# Patient Record
Sex: Male | Born: 1975 | Race: White | Hispanic: No | Marital: Single | Smoking: Former smoker
Health system: Southern US, Community
[De-identification: ages and names within clinical notes are randomized; demographics above are authoritative.]

## PROBLEM LIST (undated history)

## (undated) DIAGNOSIS — I10 Essential (primary) hypertension: Secondary | ICD-10-CM

## (undated) DIAGNOSIS — K219 Gastro-esophageal reflux disease without esophagitis: Secondary | ICD-10-CM

## (undated) DIAGNOSIS — K429 Umbilical hernia without obstruction or gangrene: Secondary | ICD-10-CM

## (undated) DIAGNOSIS — F1010 Alcohol abuse, uncomplicated: Secondary | ICD-10-CM

## (undated) DIAGNOSIS — E785 Hyperlipidemia, unspecified: Secondary | ICD-10-CM

## (undated) DIAGNOSIS — M1990 Unspecified osteoarthritis, unspecified site: Secondary | ICD-10-CM

## (undated) DIAGNOSIS — E039 Hypothyroidism, unspecified: Secondary | ICD-10-CM

## (undated) DIAGNOSIS — F32A Depression, unspecified: Secondary | ICD-10-CM

## (undated) HISTORY — PX: SPLENECTOMY, TOTAL: SHX788

## (undated) HISTORY — PX: HERNIA REPAIR: SHX51

## (undated) HISTORY — DX: Depression, unspecified: F32.A

## (undated) HISTORY — DX: Hyperlipidemia, unspecified: E78.5

---

## 2009-06-17 ENCOUNTER — Emergency Department (HOSPITAL_COMMUNITY): Admission: EM | Admit: 2009-06-17 | Discharge: 2009-06-18 | Payer: Self-pay | Admitting: Emergency Medicine

## 2009-06-21 ENCOUNTER — Emergency Department: Payer: Self-pay | Admitting: Emergency Medicine

## 2009-08-02 ENCOUNTER — Emergency Department (HOSPITAL_COMMUNITY): Admission: EM | Admit: 2009-08-02 | Discharge: 2009-08-02 | Payer: Self-pay | Admitting: Emergency Medicine

## 2009-08-03 ENCOUNTER — Emergency Department (HOSPITAL_COMMUNITY): Admission: EM | Admit: 2009-08-03 | Discharge: 2009-08-04 | Payer: Self-pay | Admitting: Emergency Medicine

## 2009-08-04 ENCOUNTER — Ambulatory Visit: Payer: Self-pay | Admitting: Psychiatry

## 2009-08-04 ENCOUNTER — Inpatient Hospital Stay (HOSPITAL_COMMUNITY): Admission: AD | Admit: 2009-08-04 | Discharge: 2009-08-12 | Payer: Self-pay | Admitting: Psychiatry

## 2010-05-18 LAB — DIFFERENTIAL
Basophils Absolute: 0.1 10*3/uL (ref 0.0–0.1)
Basophils Relative: 1 % (ref 0–1)
Eosinophils Absolute: 0 10*3/uL (ref 0.0–0.7)
Eosinophils Relative: 0 % (ref 0–5)
Lymphocytes Relative: 20 % (ref 12–46)
Lymphs Abs: 2.4 10*3/uL (ref 0.7–4.0)
Monocytes Absolute: 1 10*3/uL (ref 0.1–1.0)
Monocytes Relative: 9 % (ref 3–12)
Neutro Abs: 8.5 10*3/uL — ABNORMAL HIGH (ref 1.7–7.7)
Neutrophils Relative %: 70 % (ref 43–77)

## 2010-05-18 LAB — CBC
HCT: 49.8 % (ref 39.0–52.0)
Hemoglobin: 16.7 g/dL (ref 13.0–17.0)
MCHC: 33.6 g/dL (ref 30.0–36.0)
MCV: 96.6 fL (ref 78.0–100.0)
Platelets: 273 10*3/uL (ref 150–400)
RBC: 5.15 MIL/uL (ref 4.22–5.81)
RDW: 15.6 % — ABNORMAL HIGH (ref 11.5–15.5)
WBC: 12.1 10*3/uL — ABNORMAL HIGH (ref 4.0–10.5)

## 2010-05-18 LAB — COMPREHENSIVE METABOLIC PANEL
ALT: 27 U/L (ref 0–53)
AST: 32 U/L (ref 0–37)
Albumin: 4.5 g/dL (ref 3.5–5.2)
Alkaline Phosphatase: 77 U/L (ref 39–117)
BUN: 9 mg/dL (ref 6–23)
CO2: 24 mEq/L (ref 19–32)
Calcium: 8.5 mg/dL (ref 8.4–10.5)
Chloride: 105 mEq/L (ref 96–112)
Creatinine, Ser: 0.79 mg/dL (ref 0.4–1.5)
GFR calc Af Amer: >60 mL/min (ref >60)
GFR calc non Af Amer: >60 mL/min (ref >60)
Glucose, Bld: 182 mg/dL — ABNORMAL HIGH (ref 70–99)
Potassium: 3.2 mEq/L — ABNORMAL LOW (ref 3.5–5.1)
Sodium: 139 mEq/L (ref 135–145)
Total Bilirubin: 0.8 mg/dL (ref 0.3–1.2)
Total Protein: 7.3 g/dL (ref 6.0–8.3)

## 2010-05-18 LAB — URINALYSIS, ROUTINE W REFLEX MICROSCOPIC
Bilirubin Urine: NEGATIVE
Glucose, UA: NEGATIVE mg/dL
Leukocytes, UA: NEGATIVE
Nitrite: NEGATIVE
Protein, ur: 30 mg/dL — AB
Specific Gravity, Urine: 1.028 (ref 1.005–1.030)
Urobilinogen, UA: 0.2 mg/dL (ref 0.0–1.0)
pH: 5.5 (ref 5.0–8.0)

## 2010-05-18 LAB — RAPID URINE DRUG SCREEN, HOSP PERFORMED
Amphetamines: NOT DETECTED
Barbiturates: NOT DETECTED
Benzodiazepines: NOT DETECTED
Cocaine: NOT DETECTED
Opiates: NOT DETECTED
Tetrahydrocannabinol: NOT DETECTED

## 2010-05-18 LAB — TSH: TSH: 9.981 u[IU]/mL — ABNORMAL HIGH (ref 0.350–4.500)

## 2010-05-18 LAB — URINE MICROSCOPIC-ADD ON

## 2010-05-18 LAB — GLUCOSE, CAPILLARY: Glucose-Capillary: 150 mg/dL — ABNORMAL HIGH (ref 70–99)

## 2010-05-18 LAB — ETHANOL: Alcohol, Ethyl (B): 133 mg/dL — ABNORMAL HIGH (ref 0–10)

## 2010-05-18 LAB — T4, FREE: Free T4: 1.12 ng/dL (ref 0.80–1.80)

## 2010-05-18 LAB — T3, FREE: T3, Free: 3.3 pg/mL (ref 2.3–4.2)

## 2010-05-19 LAB — BASIC METABOLIC PANEL
BUN: 10 mg/dL (ref 6–23)
CO2: 23 mEq/L (ref 19–32)
Calcium: 8.9 mg/dL (ref 8.4–10.5)
Chloride: 107 mEq/L (ref 96–112)
Creatinine, Ser: 0.88 mg/dL (ref 0.4–1.5)
GFR calc Af Amer: >60 mL/min (ref >60)
GFR calc non Af Amer: >60 mL/min (ref >60)
Glucose, Bld: 91 mg/dL (ref 70–99)
Potassium: 3.5 mEq/L (ref 3.5–5.1)
Sodium: 141 mEq/L (ref 135–145)

## 2010-05-19 LAB — DIFFERENTIAL
Basophils Absolute: 0.1 10*3/uL (ref 0.0–0.1)
Basophils Relative: 0 % (ref 0–1)
Eosinophils Absolute: 0.1 10*3/uL (ref 0.0–0.7)
Eosinophils Relative: 1 % (ref 0–5)
Lymphocytes Relative: 26 % (ref 12–46)
Lymphs Abs: 3.1 10*3/uL (ref 0.7–4.0)
Monocytes Absolute: 1 10*3/uL (ref 0.1–1.0)
Monocytes Relative: 8 % (ref 3–12)
Neutro Abs: 7.9 10*3/uL — ABNORMAL HIGH (ref 1.7–7.7)
Neutrophils Relative %: 65 % (ref 43–77)

## 2010-05-19 LAB — CBC
HCT: 53.5 % — ABNORMAL HIGH (ref 39.0–52.0)
Hemoglobin: 17.5 g/dL — ABNORMAL HIGH (ref 13.0–17.0)
MCHC: 32.8 g/dL (ref 30.0–36.0)
MCV: 97 fL (ref 78.0–100.0)
Platelets: 325 10*3/uL (ref 150–400)
RBC: 5.51 MIL/uL (ref 4.22–5.81)
RDW: 16.1 % — ABNORMAL HIGH (ref 11.5–15.5)
WBC: 12.1 10*3/uL — ABNORMAL HIGH (ref 4.0–10.5)

## 2010-05-19 LAB — RAPID URINE DRUG SCREEN, HOSP PERFORMED
Amphetamines: NOT DETECTED
Barbiturates: NOT DETECTED
Benzodiazepines: NOT DETECTED
Cocaine: NOT DETECTED
Opiates: NOT DETECTED
Tetrahydrocannabinol: NOT DETECTED

## 2010-05-19 LAB — TRICYCLICS SCREEN, URINE: TCA Scrn: NOT DETECTED

## 2010-05-19 LAB — ETHANOL
Alcohol, Ethyl (B): 171 mg/dL — ABNORMAL HIGH (ref 0–10)
Alcohol, Ethyl (B): 343 mg/dL — ABNORMAL HIGH (ref 0–10)

## 2010-08-18 ENCOUNTER — Emergency Department (HOSPITAL_COMMUNITY)
Admission: EM | Admit: 2010-08-18 | Discharge: 2010-08-19 | Disposition: A | Discharge status: Other Acute Inpatient Hospital | Payer: Self-pay | Attending: Emergency Medicine | Admitting: Emergency Medicine

## 2010-08-18 DIAGNOSIS — F4320 Adjustment disorder, unspecified: Secondary | ICD-10-CM

## 2010-08-18 DIAGNOSIS — F1010 Alcohol abuse, uncomplicated: Secondary | ICD-10-CM | POA: Insufficient documentation

## 2010-08-18 DIAGNOSIS — E039 Hypothyroidism, unspecified: Secondary | ICD-10-CM | POA: Insufficient documentation

## 2010-08-18 LAB — DIFFERENTIAL
Basophils Absolute: 0.1 10*3/uL (ref 0.0–0.1)
Basophils Relative: 0 % (ref 0–1)
Eosinophils Absolute: 0 10*3/uL (ref 0.0–0.7)
Eosinophils Relative: 0 % (ref 0–5)
Lymphocytes Relative: 27 % (ref 12–46)
Lymphs Abs: 3.7 10*3/uL (ref 0.7–4.0)
Monocytes Absolute: 0.8 10*3/uL (ref 0.1–1.0)
Monocytes Relative: 6 % (ref 3–12)
Neutro Abs: 9.4 10*3/uL — ABNORMAL HIGH (ref 1.7–7.7)
Neutrophils Relative %: 67 % (ref 43–77)

## 2010-08-18 LAB — COMPREHENSIVE METABOLIC PANEL
ALT: 30 U/L (ref 0–53)
AST: 25 U/L (ref 0–37)
Albumin: 4.4 g/dL (ref 3.5–5.2)
Alkaline Phosphatase: 86 U/L (ref 39–117)
BUN: 12 mg/dL (ref 6–23)
CO2: 21 mEq/L (ref 19–32)
Calcium: 8.9 mg/dL (ref 8.4–10.5)
Chloride: 103 mEq/L (ref 96–112)
Creatinine, Ser: 0.91 mg/dL (ref 0.50–1.35)
GFR calc Af Amer: >60 mL/min (ref >60)
GFR calc non Af Amer: >60 mL/min (ref >60)
Glucose, Bld: 122 mg/dL — ABNORMAL HIGH (ref 70–99)
Potassium: 3.5 mEq/L (ref 3.5–5.1)
Sodium: 139 mEq/L (ref 135–145)
Total Bilirubin: 0.2 mg/dL — ABNORMAL LOW (ref 0.3–1.2)
Total Protein: 7.6 g/dL (ref 6.0–8.3)

## 2010-08-18 LAB — RAPID URINE DRUG SCREEN, HOSP PERFORMED
Amphetamines: NOT DETECTED
Barbiturates: NOT DETECTED
Benzodiazepines: NOT DETECTED
Cocaine: NOT DETECTED
Opiates: NOT DETECTED
Tetrahydrocannabinol: NOT DETECTED

## 2010-08-18 LAB — CBC
HCT: 49.8 % (ref 39.0–52.0)
Hemoglobin: 17.3 g/dL — ABNORMAL HIGH (ref 13.0–17.0)
MCH: 31.3 pg (ref 26.0–34.0)
MCHC: 34.7 g/dL (ref 30.0–36.0)
MCV: 90.2 fL (ref 78.0–100.0)
Platelets: 373 10*3/uL (ref 150–400)
RBC: 5.52 MIL/uL (ref 4.22–5.81)
RDW: 15.7 % — ABNORMAL HIGH (ref 11.5–15.5)
WBC: 13.9 10*3/uL — ABNORMAL HIGH (ref 4.0–10.5)

## 2010-08-18 LAB — ETHANOL: Alcohol, Ethyl (B): 363 mg/dL — ABNORMAL HIGH (ref 0–11)

## 2010-08-19 ENCOUNTER — Inpatient Hospital Stay (HOSPITAL_COMMUNITY)
Admission: AD | Admit: 2010-08-19 | Discharge: 2010-08-22 | DRG: 897 | Disposition: A | Discharge status: Home / Self Care | Payer: PRIVATE HEALTH INSURANCE | Source: Ambulatory Visit | Attending: Psychiatry | Admitting: Psychiatry

## 2010-08-19 DIAGNOSIS — F39 Unspecified mood [affective] disorder: Secondary | ICD-10-CM

## 2010-08-19 DIAGNOSIS — E039 Hypothyroidism, unspecified: Secondary | ICD-10-CM

## 2010-08-19 DIAGNOSIS — F1020 Alcohol dependence, uncomplicated: Principal | ICD-10-CM

## 2010-08-20 DIAGNOSIS — F39 Unspecified mood [affective] disorder: Secondary | ICD-10-CM

## 2010-08-20 DIAGNOSIS — F1020 Alcohol dependence, uncomplicated: Secondary | ICD-10-CM

## 2010-08-20 LAB — GLUCOSE, CAPILLARY: Glucose-Capillary: 102 mg/dL — ABNORMAL HIGH (ref 70–99)

## 2010-08-21 LAB — GLUCOSE, CAPILLARY: Glucose-Capillary: 121 mg/dL — ABNORMAL HIGH (ref 70–99)

## 2010-08-25 NOTE — H&P (Signed)
NAMESTANCIL, Brandon Mullen                ACCOUNT NO.:  [id number]  MEDICAL RECORD NO.:  [id number]  LOCATION:  0302                          FACILITY:  BH  PHYSICIAN:  Eulogio Ditch, MD DATE OF BIRTH:  03-Jan-1976  DATE OF ADMISSION:  08/19/2010 DATE OF DISCHARGE:                      PSYCHIATRIC ADMISSION ASSESSMENT   HISTORY OF PRESENT ILLNESS:  This is a 35 year old single white male. He was brought into the [facility] ED about 6:30 on 06/19 which was yesterday.  He stated that he was trying to get arrested, but they would not arrest him.  He asked for detox from alcohol.  He denied being suicidal, but he stated he might be homicidal.  Everyone messes with him except girls.  His alcohol level was 363.  He states he had been sober about a year.  He had been living in an [address].  He stated that he only had a 1 day relapse on Vodka.  This was due to cutting back on meetings.  He thought he could handle alcohol and obviously he cannot.  PAST PSYCHIATRIC HISTORY:  He was with [place] last summer.  He was here June 6 to June 14, that was the first admission, he had been on a drinking binge.  He was to follow up at [facility] after he was discharged from here, and according to him, he entered the [facility] shortly after that.  Other than this documented in-patient stay, all we have is his account.  He states he has been to [facility].  He thinks he was there in March 2011, and he claims to have had 1 prior detox in [place] in 2010, but again, he cannot substantiate the details.  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  He has never married.  He has no children.  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  FAMILY HISTORY:  His maternal grandfather was an alcoholic and that side of the family had exuberant alcohol imbibing going on.  PRIMARY CARE PROVIDER:  Dr. Katrinka Blazing of the [facility].  PAST MEDICAL HISTORY:  He is  hypothyroid.  He gets his medication and [facility].  I called, and he is actually, correct.  He is currently prescribed 0.175 mg of the levothyroxine.  DRUG ALLERGIES:  No known drug allergies.  POSITIVE PHYSICAL FINDINGS:  He is a well-developed, well-nourished, very sunburned young man who appears his stated age.  He is not exhibiting withdrawal symptoms at this time.  His temperature was 98-99, pulse was 84-120, respirations were 18-22 and his blood pressure ranged from 112/73 to 144/93.  His WBC was a little bit elevated at 13.9.  He denies a cold.  He did not have any evidence for any infections.  His UDS was negative, and he did not have any elevation of SGOT which would agree with his report that he has only had a 1-day relapse.  He does report some left shoulder pain that comes and goes.  He is not sure what that is from.  MENTAL STATUS EXAM:  He was drowsy.  He had been given some Librium in the ED before coming over.  He is casually but appropriately groomed, dressed and nourished.  His speech was not slurred.  His mood was appropriate to situation.  Thought processes relatively clear, rational and goal oriented.  Wonders where he is going to live.  Judgment and insight are fair.  Concentration and memory superficially are intact. Intelligence is average.  He is not suicidal or homicidal.  He is not having any auditory or visual hallucinations.  DIAGNOSES:  AXIS I:  A 1-day relapse on alcohol, history for alcohol dependence, reports having been sober since last admission last June, rule out mood disorder NOS. AXIS II:  Deferred. AXIS III:  Hypothyroid. AXIS IV:  Relationship supports, financial, now homeless, thrown out of the [facility]. AXIS V:  30.  PLAN:  The plan is to admit for safety and stabilization.  Will give him a little more Librium tonight to help him sleep.  He will be assessed in the morning for further medications and to help him with  placement. Estimated length of stay is 2-3 days.     Mickie Leonarda Salon, P.A.-C.   ______________________________ Eulogio Ditch, MD    MD/MEDQ  D:  08/19/2010  T:  08/20/2010  Job:  [id number]  Electronically Signed by Jaci Lazier ADAMS P.A.-C. on 08/24/2010 06:16:02 PM Electronically Signed by Eulogio Ditch  on 08/25/2010 12:08:33 PM

## 2010-09-04 NOTE — Discharge Summary (Signed)
  NAMEJASANI, Brandon Mullen                ACCOUNT NO.:  [id number]  MEDICAL RECORD NO.:  [id number]  LOCATION:  0302                          FACILITY:  BH  PHYSICIAN:  Vic Ripper, P.A.-C.DATE OF BIRTH:  07/09/1975  DATE OF ADMISSION:  08/19/2010 DATE OF DISCHARGE:                              DISCHARGE SUMMARY   The patient was admitted on August 19, 2010.  He is being discharged on 06/23. He was admitted for medical support for a 1-day relapse on alcohol.  He stated that he has been at the [facility], that he had a 1-day relapse on alcohol, although it was exuberant. His alcohol level was 363.  He stated he was drinking vodka.  PERTINENT LAB DATA:  As already stated, his admission alcohol level was 363 in the emergency room. He is hypothyroid. He is compliant with his levothyroxine 0.175 mg p.o. q. day.  He is a patient of Dr. Katrinka Blazing at the [facility].  FINAL DIAGNOSES:  AXIS I:  Brief alcohol relapse. AXIS II: No diagnosis. AXIS III: Hypothyroid. AXIS IV: Relationship issues. 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  CONDITION:  He is no longer having hand tremor.  He has detoxed through support with the low-dose Librium protocol.  PLAN:  For him to see go back to [facility]. But until he is allowed back, which would be approximately another week, he is going to stay with his father.  DISCHARGE MEDS:  Just his Synthroid.  He also had a letter and this was, I guess, for help with him being readmitted to [facility].     Vic Ripper, P.A.-C.     MD/MEDQ  D:  08/22/2010  T:  08/22/2010  Job:  [id number]  Electronically Signed by Jaci Lazier ADAMS P.A.-C. on 08/24/2010 06:16:07 PM Electronically Signed by Eulogio Ditch  on 09/04/2010 07:30:14 AM

## 2010-10-27 ENCOUNTER — Emergency Department (HOSPITAL_COMMUNITY)
Admission: EM | Admit: 2010-10-27 | Discharge: 2010-10-28 | Disposition: A | Discharge status: Other Acute Inpatient Hospital | Payer: Self-pay | Attending: Emergency Medicine | Admitting: Emergency Medicine

## 2010-10-27 DIAGNOSIS — F1910 Other psychoactive substance abuse, uncomplicated: Secondary | ICD-10-CM | POA: Insufficient documentation

## 2010-10-27 DIAGNOSIS — F1010 Alcohol abuse, uncomplicated: Secondary | ICD-10-CM | POA: Insufficient documentation

## 2010-10-27 DIAGNOSIS — E039 Hypothyroidism, unspecified: Secondary | ICD-10-CM | POA: Insufficient documentation

## 2010-10-27 DIAGNOSIS — R45850 Homicidal ideations: Secondary | ICD-10-CM | POA: Insufficient documentation

## 2010-10-27 DIAGNOSIS — R45851 Suicidal ideations: Secondary | ICD-10-CM | POA: Insufficient documentation

## 2010-10-27 LAB — DIFFERENTIAL
Basophils Absolute: 0.1 10*3/uL (ref 0.0–0.1)
Basophils Relative: 1 % (ref 0–1)
Eosinophils Absolute: 0.1 10*3/uL (ref 0.0–0.7)
Eosinophils Relative: 1 % (ref 0–5)
Lymphocytes Relative: 29 % (ref 12–46)
Lymphs Abs: 3.6 10*3/uL (ref 0.7–4.0)
Monocytes Absolute: 1.1 10*3/uL — ABNORMAL HIGH (ref 0.1–1.0)
Monocytes Relative: 9 % (ref 3–12)
Neutro Abs: 7.7 10*3/uL (ref 1.7–7.7)
Neutrophils Relative %: 61 % (ref 43–77)

## 2010-10-27 LAB — CBC
HCT: 48.9 % (ref 39.0–52.0)
Hemoglobin: 17.3 g/dL — ABNORMAL HIGH (ref 13.0–17.0)
MCH: 31.7 pg (ref 26.0–34.0)
MCHC: 35.4 g/dL (ref 30.0–36.0)
MCV: 89.7 fL (ref 78.0–100.0)
Platelets: 347 10*3/uL (ref 150–400)
RBC: 5.45 MIL/uL (ref 4.22–5.81)
RDW: 14.9 % (ref 11.5–15.5)
WBC: 12.6 10*3/uL — ABNORMAL HIGH (ref 4.0–10.5)

## 2010-10-28 ENCOUNTER — Inpatient Hospital Stay (HOSPITAL_COMMUNITY)
Admission: EM | Admit: 2010-10-28 | Discharge: 2010-11-06 | DRG: 897 | Disposition: A | Discharge status: Home / Self Care | Payer: Self-pay | Source: Intra-hospital | Attending: Psychiatry | Admitting: Psychiatry

## 2010-10-28 DIAGNOSIS — F329 Major depressive disorder, single episode, unspecified: Secondary | ICD-10-CM

## 2010-10-28 DIAGNOSIS — F1994 Other psychoactive substance use, unspecified with psychoactive substance-induced mood disorder: Secondary | ICD-10-CM

## 2010-10-28 DIAGNOSIS — F29 Unspecified psychosis not due to a substance or known physiological condition: Secondary | ICD-10-CM

## 2010-10-28 DIAGNOSIS — F1410 Cocaine abuse, uncomplicated: Secondary | ICD-10-CM

## 2010-10-28 DIAGNOSIS — F411 Generalized anxiety disorder: Secondary | ICD-10-CM

## 2010-10-28 DIAGNOSIS — Z6379 Other stressful life events affecting family and household: Secondary | ICD-10-CM

## 2010-10-28 DIAGNOSIS — R45851 Suicidal ideations: Secondary | ICD-10-CM

## 2010-10-28 DIAGNOSIS — F17200 Nicotine dependence, unspecified, uncomplicated: Secondary | ICD-10-CM

## 2010-10-28 DIAGNOSIS — E039 Hypothyroidism, unspecified: Secondary | ICD-10-CM

## 2010-10-28 DIAGNOSIS — F1020 Alcohol dependence, uncomplicated: Principal | ICD-10-CM

## 2010-10-28 LAB — COMPREHENSIVE METABOLIC PANEL
ALT: 35 U/L (ref 0–53)
AST: 21 U/L (ref 0–37)
Albumin: 3.9 g/dL (ref 3.5–5.2)
Alkaline Phosphatase: 86 U/L (ref 39–117)
BUN: 9 mg/dL (ref 6–23)
CO2: 23 mEq/L (ref 19–32)
Calcium: 8.9 mg/dL (ref 8.4–10.5)
Chloride: 101 mEq/L (ref 96–112)
Creatinine, Ser: 1.01 mg/dL (ref 0.50–1.35)
GFR calc Af Amer: >60 mL/min (ref >60)
GFR calc non Af Amer: >60 mL/min (ref >60)
Glucose, Bld: 105 mg/dL — ABNORMAL HIGH (ref 70–99)
Potassium: 3.5 mEq/L (ref 3.5–5.1)
Sodium: 138 mEq/L (ref 135–145)
Total Bilirubin: 0.4 mg/dL (ref 0.3–1.2)
Total Protein: 6.8 g/dL (ref 6.0–8.3)

## 2010-10-28 LAB — RAPID URINE DRUG SCREEN, HOSP PERFORMED
Amphetamines: NOT DETECTED
Barbiturates: NOT DETECTED
Benzodiazepines: NOT DETECTED
Cocaine: POSITIVE — AB
Opiates: NOT DETECTED
Tetrahydrocannabinol: NOT DETECTED

## 2010-10-28 LAB — ETHANOL: Alcohol, Ethyl (B): 194 mg/dL — ABNORMAL HIGH (ref 0–11)

## 2010-10-29 DIAGNOSIS — F17200 Nicotine dependence, unspecified, uncomplicated: Secondary | ICD-10-CM

## 2010-10-29 DIAGNOSIS — F1020 Alcohol dependence, uncomplicated: Secondary | ICD-10-CM

## 2010-10-29 LAB — TSH: TSH: 1.015 u[IU]/mL (ref 0.350–4.500)

## 2010-10-30 LAB — T4, FREE: Free T4: 1.12 ng/dL (ref 0.80–1.80)

## 2010-10-30 LAB — TSH: TSH: 0.86 u[IU]/mL (ref 0.350–4.500)

## 2010-10-30 LAB — T3, FREE: T3, Free: 2.9 pg/mL (ref 2.3–4.2)

## 2010-10-31 DIAGNOSIS — F1020 Alcohol dependence, uncomplicated: Secondary | ICD-10-CM

## 2010-11-04 LAB — LIPID PANEL
Cholesterol: 228 mg/dL — ABNORMAL HIGH (ref 0–200)
HDL: 33 mg/dL — ABNORMAL LOW (ref >39)
LDL Cholesterol: 144 mg/dL — ABNORMAL HIGH (ref 0–99)
Total CHOL/HDL Ratio: 6.9 RATIO
Triglycerides: 256 mg/dL — ABNORMAL HIGH (ref <150)
VLDL: 51 mg/dL — ABNORMAL HIGH (ref 0–40)

## 2010-11-04 LAB — HEMOGLOBIN A1C
Hgb A1c MFr Bld: 5.8 % — ABNORMAL HIGH (ref <5.7)
Mean Plasma Glucose: 120 mg/dL — ABNORMAL HIGH (ref <117)

## 2010-11-04 LAB — PROLACTIN: Prolactin: 34.4 ng/mL — ABNORMAL HIGH (ref 2.1–17.1)

## 2010-11-05 NOTE — Assessment & Plan Note (Signed)
Brandon Mullen, Brandon Mullen                ACCOUNT NO.:  [id number]  MEDICAL RECORD NO.:  [id number]  LOCATION:  0304                          FACILITY:  BH  PHYSICIAN:  Orson Aloe, MD       DATE OF BIRTH:  [birth year]  DATE OF ADMISSION:  10/28/2010 DATE OF DISCHARGE:                      PSYCHIATRIC ADMISSION ASSESSMENT   This is a voluntary admission to the services of Dr. Orson Aloe.  This is a 35 year old single white male, never been married.  He presented to the emergency department at [facility].  He stated that he was suicidal, that he did not want to live anymore.  He had been trying to do it through drugs and alcohol, but he cannot afford to do it that way.  He was positive for cocaine in the emergency room.  He states that he has only been using cocaine for 3 days.  He reports a relapse on vodka for the past week, that he has been drinking a fifth of vodka daily.  When he last left here on June 23, he was to be at home with his father for a week and then go back to his prior [address].  Somehow he never made it to the [facility].  He denies withdrawal seizures or DTs, although he does have marked hand tremor at the moment.  He has had frequent black outs.  PAST PSYCHIATRIC HISTORY:  As already stated, he was with [place] earlier this year for similar complaints, brief alcoholic relapse June 20 to 23. He was here last year, also in June.  He reported that he was an inpatient in [place] in 2010, and in March 2011 he was an inpatient at [facility].  SOCIAL HISTORY:  He is a high Garment/textile technologist, class of 1994.  He has never married.  He has no children.  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  FAMILY HISTORY:  His maternal grandfather was an alcoholic and that side of the family was known for exuberant imbibing of alcohol.  PRIMARY CARE PROVIDER:  Dr. Katrinka Blazing at the [facility].  PAST MEDICAL HISTORY:  He is  hypothyroid.  He gets his medications at [facility].  He continues to be prescribed levothyroxine 0.175 mg daily.  He has no known drug allergies.  PHYSICAL FINDINGS:  He was medically cleared in the [facility] ED.  He presented afebrile.  His temperature was 97.6 to 98.8.  His pulse ranged from 80-118, respirations were 18-20, blood pressure was 113/71 to 133/79.  His alcohol level was 194.  His UDS was positive for cocaine. He did not have any elevations of SGOT or SGPT, which would concur with his report of a brief relapse.  MENTAL STATUS EXAM:  He was seen in his room.  He was in bed.  He is casually groomed and dressed.  His speech is not pressured.  His mood is appropriate to the situation.  Thought processes are clear, rational, and goal-oriented.  He states that he thinks that there is something wrong with him, that besides just getting sober he would like to stay in a hospital long enough to figure  out "what is wrong with him."  Judgment and insight are fair.  Concentration and memory are superficially intact.  Intelligence is average.  He gets especially suicidal with drinking.  He reports that he gestures by trying to "drink himself to death" or use drugs.  DIAGNOSES:  AXIS I:  Alcohol abuse, major depressive disorder, cocaine abuse. AXIS II:  Deferred. AXIS III:  Hypothyroid. AXIS IV:  Severe issues with relationships, financial, etc. AXIS V:  35.  PLAN:  Admit for safety and stabilization.  He will be helped withdrawal from alcohol through use of the low-dose Librium protocol.  Will start some Celexa 20 mg at bedtime to deal with his depression.  Will have case managers look into longer-term substance abuse dual-diagnosis program.     Vic Ripper, P.A.-C.   ______________________________ Orson Aloe, MD    MD/MEDQ  D:  10/28/2010  T:  10/29/2010  Job:  [id number]  Electronically Signed by Jaci Lazier ADAMS P.A.-C. on 11/01/2010 01:07:36  PM Electronically Signed by Orson Aloe  on 11/05/2010 04:34:08 PM

## 2010-11-26 NOTE — Discharge Summary (Signed)
Brandon Mullen, Brandon Mullen                ACCOUNT NO.:  [id number]  MEDICAL RECORD NO.:  [id number]  LOCATION:  0304                          FACILITY:  BH  PHYSICIAN:  Orson Aloe, MD       DATE OF BIRTH:  1975/06/13  DATE OF ADMISSION:  10/28/2010 DATE OF DISCHARGE:  11/06/2010                              DISCHARGE SUMMARY   This was a voluntary admission for this 35 year old, single white male who has never married.  He presented to the emergency room at [facility] and stated he was suicidal and that he did not want to live anymore.  He was trying to do that through drugs and alcohol, but he could not afford it that way.  He was positive for cocaine in the emergency room.  He stated he had only been using cocaine for 3 days. He reports relapse on Vicodin for the past week, and he had been drinking a fifth of vodka daily.  When he left here on June 23rd, he was at home with his father for a week and then to go back to his prior [address].  Somehow, he never made back to the [facility].  He denies withdrawal seizures or DTs, although he does have marked hand tremor at this time.  He had had frequent blackouts.  PHYSICAL FINDINGS:  His urine drug screen was positive for cocaine.  His alcohol level was 194.  He did not have elevated SGOT or SGPT which would suggest a brief relapse.  DIAGNOSES ON ADMISSION:  Axis I:  Alcohol abuse, major depressive disorder and cocaine abuse. Axis II:  Deferred. Axis III:  Hypothyroidism. Axis IV:  Severe issues with relationships and financial. Axis V:  35.  HOSPITAL COURSE:  He was placed on Celexa and then also on Prozac 10 mg at 6:00 p.m.  Risperdal 0.25 was tried for awhile and then increased to Risperdal 0.5 twice a day and increased to 3 times a day.  Prolactin was noted to be elevated, and addition of Abilify would be reasonable on an outpatient basis.  Inderal 10 mg 3 times a day was started during hospital course.  He confessed, in  psychosocial assessment, that he had been hearing voices, and that is why he was using the alcohol, to manage his voices.  He says the voices describe suggestions to harm himself, and he got real violent on the last binge.  He actually was interested in starting Antabuse after discharge to further help with his sobriety. The medicines did not help at the lower doses and finally, at the higher doses, he did notice relief from the voices on the Risperdal.  His thyroid T3 and free T4 and TSH were all within normal limits.  He is agreeable to long-term treatment.  He describes that his mood is still quite depressed on both the Celexa and the Prozac.  Through several different discussions, he changed from wanting to go to  [facility] to eventually deciding to go home to be followed up at [facility].  CONDITION ON DISCHARGE:  He denied any suicidal or homicidal ideation. He denied any hallucinations, illusions, delusions.  He has improved on the  increased dose of Risperdal.  He had good eye contact.  He was able to focus for interactions in group and individual.  He had clear, goal- directed thoughts.  He was oriented x4.  His recent and remote memory was intact.  His judgment, he indicated he had arranged a fairly safe aftercare plan.  He agreed to try Inderal and also wanted to keep using Antabuse in mind for future use.  Inderal was aimed at helping with his social phobia which did seem to help.  DISCHARGE DIAGNOSES:  Axis I:  Alcohol dependence; nicotine dependence; substance use and mood disorder, rule out substance use psychosis and social phobia. Axis II:  Deferred. Axis III:  Hypothyroidism on replacement, in typical range. Axis IV:  Moderate psychosocial issues related to substance use. Axis V:  55.  RECOMMENDATIONS:  Resume typical activities.  Resume typical diet.  DISCHARGE MEDICATIONS: 1. Celexa 20 mg at bedtime. 2. Fluoxetine 10 mg in the evening. 3. Multivitamin 1 a  day. 4. Inderal 10 mg 3 times a day. 5. Risperdal 0.5 mg 3 times a day for clear thoughts. 6. Levothyroxine 175 mcg a day.  He was discharged to follow up with [facility] on [address].  He had an appointment at 8:00 a.m. on the 11th of September.  He was told to consider Antabuse.  Also, prolactin is elevated, consider adding Abilify to lower the prolactin level.          ______________________________ Orson Aloe, MD     EW/MEDQ  D:  11/25/2010  T:  11/25/2010  Job:  [id number]  Electronically Signed by Orson Aloe  on 11/26/2010 10:50:37 AM

## 2013-06-01 ENCOUNTER — Emergency Department (HOSPITAL_COMMUNITY)
Admission: EM | Admit: 2013-06-01 | Discharge: 2013-06-02 | Disposition: A | Discharge status: Home / Self Care | Payer: Self-pay | Attending: Emergency Medicine | Admitting: Emergency Medicine

## 2013-06-01 ENCOUNTER — Encounter (HOSPITAL_COMMUNITY): Payer: Self-pay | Admitting: Emergency Medicine

## 2013-06-01 DIAGNOSIS — Z8719 Personal history of other diseases of the digestive system: Secondary | ICD-10-CM | POA: Insufficient documentation

## 2013-06-01 DIAGNOSIS — Z79899 Other long term (current) drug therapy: Secondary | ICD-10-CM | POA: Insufficient documentation

## 2013-06-01 DIAGNOSIS — F17200 Nicotine dependence, unspecified, uncomplicated: Secondary | ICD-10-CM | POA: Insufficient documentation

## 2013-06-01 DIAGNOSIS — F1010 Alcohol abuse, uncomplicated: Secondary | ICD-10-CM | POA: Insufficient documentation

## 2013-06-01 HISTORY — DX: Alcohol abuse, uncomplicated: F10.10

## 2013-06-01 HISTORY — DX: Umbilical hernia without obstruction or gangrene: K42.9

## 2013-06-01 LAB — CBC
HCT: 49.3 % (ref 39.0–52.0)
Hemoglobin: 17.1 g/dL — ABNORMAL HIGH (ref 13.0–17.0)
MCH: 31.4 pg (ref 26.0–34.0)
MCHC: 34.7 g/dL (ref 30.0–36.0)
MCV: 90.5 fL (ref 78.0–100.0)
Platelets: 332 10*3/uL (ref 150–400)
RBC: 5.45 MIL/uL (ref 4.22–5.81)
RDW: 15.2 % (ref 11.5–15.5)
WBC: 9.7 10*3/uL (ref 4.0–10.5)

## 2013-06-01 LAB — ACETAMINOPHEN LEVEL: Acetaminophen (Tylenol), Serum: <15 ug/mL (ref 10–30)

## 2013-06-01 LAB — RAPID URINE DRUG SCREEN, HOSP PERFORMED
Amphetamines: NOT DETECTED
Barbiturates: NOT DETECTED
Benzodiazepines: NOT DETECTED
Cocaine: NOT DETECTED
Opiates: NOT DETECTED
Tetrahydrocannabinol: NOT DETECTED

## 2013-06-01 LAB — COMPREHENSIVE METABOLIC PANEL
ALT: 30 U/L (ref 0–53)
AST: 27 U/L (ref 0–37)
Albumin: 4.4 g/dL (ref 3.5–5.2)
Alkaline Phosphatase: 79 U/L (ref 39–117)
BUN: 10 mg/dL (ref 6–23)
CO2: 23 mEq/L (ref 19–32)
Calcium: 8.9 mg/dL (ref 8.4–10.5)
Chloride: 103 mEq/L (ref 96–112)
Creatinine, Ser: 0.83 mg/dL (ref 0.50–1.35)
GFR calc Af Amer: >90 mL/min (ref >90)
GFR calc non Af Amer: >90 mL/min (ref >90)
Glucose, Bld: 103 mg/dL — ABNORMAL HIGH (ref 70–99)
Potassium: 3.7 mEq/L (ref 3.7–5.3)
Sodium: 142 mEq/L (ref 137–147)
Total Bilirubin: 0.3 mg/dL (ref 0.3–1.2)
Total Protein: 7.3 g/dL (ref 6.0–8.3)

## 2013-06-01 LAB — ETHANOL: Alcohol, Ethyl (B): 295 mg/dL — ABNORMAL HIGH (ref 0–11)

## 2013-06-01 LAB — SALICYLATE LEVEL: Salicylate Lvl: <2 mg/dL — ABNORMAL LOW (ref 2.8–20.0)

## 2013-06-01 NOTE — ED Notes (Signed)
Pt presents for detox from alcohol, pt states he was 24yrs sober, relapse 3 days ago. Pt states he has binge drinking x 3 days.

## 2013-06-02 ENCOUNTER — Encounter (HOSPITAL_COMMUNITY): Payer: Self-pay | Admitting: Behavioral Health

## 2013-06-02 ENCOUNTER — Inpatient Hospital Stay (HOSPITAL_COMMUNITY)
Admission: AD | Admit: 2013-06-02 | Discharge: 2013-06-05 | DRG: 897 | Disposition: A | Discharge status: Home / Self Care | Payer: Federal, State, Local not specified - Other | Source: Intra-hospital | Attending: Psychiatry | Admitting: Psychiatry

## 2013-06-02 DIAGNOSIS — E039 Hypothyroidism, unspecified: Secondary | ICD-10-CM | POA: Diagnosis present

## 2013-06-02 DIAGNOSIS — F339 Major depressive disorder, recurrent, unspecified: Secondary | ICD-10-CM | POA: Diagnosis present

## 2013-06-02 DIAGNOSIS — F410 Panic disorder [episodic paroxysmal anxiety] without agoraphobia: Secondary | ICD-10-CM | POA: Diagnosis present

## 2013-06-02 DIAGNOSIS — I10 Essential (primary) hypertension: Secondary | ICD-10-CM | POA: Diagnosis present

## 2013-06-02 DIAGNOSIS — G4700 Insomnia, unspecified: Secondary | ICD-10-CM | POA: Diagnosis present

## 2013-06-02 DIAGNOSIS — F17200 Nicotine dependence, unspecified, uncomplicated: Secondary | ICD-10-CM | POA: Diagnosis present

## 2013-06-02 DIAGNOSIS — Z87820 Personal history of traumatic brain injury: Secondary | ICD-10-CM

## 2013-06-02 DIAGNOSIS — F1020 Alcohol dependence, uncomplicated: Principal | ICD-10-CM | POA: Diagnosis present

## 2013-06-02 DIAGNOSIS — F411 Generalized anxiety disorder: Secondary | ICD-10-CM | POA: Diagnosis present

## 2013-06-02 HISTORY — DX: Hypothyroidism, unspecified: E03.9

## 2013-06-02 MED ORDER — LOPERAMIDE HCL 2 MG PO CAPS: 2.0000 mg | ORAL_CAPSULE | ORAL | Status: DC | PRN

## 2013-06-02 MED ORDER — LORAZEPAM 1 MG PO TABS: 1.0000 mg | ORAL_TABLET | Freq: Three times a day (TID) | ORAL | Status: DC | PRN

## 2013-06-02 MED ORDER — HYDROXYZINE HCL 25 MG PO TABS
25.0000 mg | ORAL_TABLET | Freq: Four times a day (QID) | ORAL | Status: AC | PRN
  Administered 2013-06-02 – 2013-06-04 (×4): 25 mg via ORAL
  Filled 2013-06-02 (×5): qty 1

## 2013-06-02 MED ORDER — CHLORDIAZEPOXIDE HCL 25 MG PO CAPS: 50.0000 mg | ORAL_CAPSULE | Freq: Once | ORAL | Status: AC

## 2013-06-02 MED ORDER — CHLORDIAZEPOXIDE HCL 25 MG PO CAPS: 25.0000 mg | ORAL_CAPSULE | ORAL | Status: DC

## 2013-06-02 MED ORDER — CHLORDIAZEPOXIDE HCL 25 MG PO CAPS: 25.0000 mg | ORAL_CAPSULE | Freq: Four times a day (QID) | ORAL | Status: DC | PRN

## 2013-06-02 MED ORDER — CHLORDIAZEPOXIDE HCL 25 MG PO CAPS: 25.0000 mg | ORAL_CAPSULE | Freq: Every day | ORAL | Status: DC

## 2013-06-02 MED ORDER — VITAMIN B-1 100 MG PO TABS
100.0000 mg | ORAL_TABLET | Freq: Every day | ORAL | Status: DC
  Administered 2013-06-02 – 2013-06-05 (×4): 100 mg via ORAL
  Filled 2013-06-02 (×5): qty 1

## 2013-06-02 MED ORDER — LOPERAMIDE HCL 2 MG PO CAPS: 2.0000 mg | ORAL_CAPSULE | ORAL | Status: AC | PRN

## 2013-06-02 MED ORDER — ONDANSETRON 4 MG PO TBDP: 4.0000 mg | ORAL_TABLET | Freq: Four times a day (QID) | ORAL | Status: AC | PRN

## 2013-06-02 MED ORDER — THIAMINE HCL 100 MG/ML IJ SOLN: 100.0000 mg | Freq: Every day | INTRAMUSCULAR | Status: DC

## 2013-06-02 MED ORDER — CHLORDIAZEPOXIDE HCL 25 MG PO CAPS
25.0000 mg | ORAL_CAPSULE | Freq: Four times a day (QID) | ORAL | Status: DC | PRN
  Administered 2013-06-02 – 2013-06-05 (×3): 25 mg via ORAL
  Filled 2013-06-02 (×3): qty 1

## 2013-06-02 MED ORDER — CHLORDIAZEPOXIDE HCL 25 MG PO CAPS
25.0000 mg | ORAL_CAPSULE | Freq: Four times a day (QID) | ORAL | Status: AC
  Administered 2013-06-02 – 2013-06-03 (×6): 25 mg via ORAL
  Filled 2013-06-02 (×6): qty 1

## 2013-06-02 MED ORDER — CHLORDIAZEPOXIDE HCL 25 MG PO CAPS: 25.0000 mg | ORAL_CAPSULE | Freq: Three times a day (TID) | ORAL | Status: DC

## 2013-06-02 MED ORDER — TRAZODONE HCL 50 MG PO TABS
50.0000 mg | ORAL_TABLET | Freq: Every evening | ORAL | Status: DC | PRN
  Administered 2013-06-02: 50 mg via ORAL
  Filled 2013-06-02: qty 1
  Filled 2013-06-02: qty 14

## 2013-06-02 MED ORDER — HYDROXYZINE HCL 25 MG PO TABS: 25.0000 mg | ORAL_TABLET | Freq: Four times a day (QID) | ORAL | Status: DC | PRN

## 2013-06-02 MED ORDER — ADULT MULTIVITAMIN W/MINERALS CH: 1.0000 | ORAL_TABLET | Freq: Every day | ORAL | Status: DC

## 2013-06-02 MED ORDER — ALUM & MAG HYDROXIDE-SIMETH 200-200-20 MG/5ML PO SUSP: 30.0000 mL | ORAL | Status: DC | PRN

## 2013-06-02 MED ORDER — CHLORDIAZEPOXIDE HCL 25 MG PO CAPS
25.0000 mg | ORAL_CAPSULE | Freq: Three times a day (TID) | ORAL | Status: DC
  Administered 2013-06-04: 25 mg via ORAL
  Filled 2013-06-02: qty 1

## 2013-06-02 MED ORDER — ONDANSETRON HCL 4 MG PO TABS: 4.0000 mg | ORAL_TABLET | Freq: Three times a day (TID) | ORAL | Status: DC | PRN

## 2013-06-02 MED ORDER — CHLORDIAZEPOXIDE HCL 25 MG PO CAPS
50.0000 mg | ORAL_CAPSULE | Freq: Once | ORAL | Status: DC
  Administered 2013-06-02: 50 mg via ORAL
  Filled 2013-06-02: qty 2

## 2013-06-02 MED ORDER — VITAMIN B-1 100 MG PO TABS: 100.0000 mg | ORAL_TABLET | Freq: Every day | ORAL | Status: DC

## 2013-06-02 MED ORDER — LEVOTHYROXINE SODIUM 150 MCG PO TABS
150.0000 ug | ORAL_TABLET | Freq: Every day | ORAL | Status: DC
  Administered 2013-06-02 – 2013-06-05 (×4): 150 ug via ORAL
  Filled 2013-06-02: qty 1
  Filled 2013-06-02: qty 2
  Filled 2013-06-02 (×4): qty 1

## 2013-06-02 MED ORDER — CHLORDIAZEPOXIDE HCL 25 MG PO CAPS: 25.0000 mg | ORAL_CAPSULE | Freq: Four times a day (QID) | ORAL | Status: DC

## 2013-06-02 MED ORDER — ACETAMINOPHEN 325 MG PO TABS
650.0000 mg | ORAL_TABLET | Freq: Four times a day (QID) | ORAL | Status: DC | PRN
  Administered 2013-06-02: 650 mg via ORAL
  Filled 2013-06-02: qty 2

## 2013-06-02 MED ORDER — ADULT MULTIVITAMIN W/MINERALS CH
1.0000 | ORAL_TABLET | Freq: Every day | ORAL | Status: DC
  Administered 2013-06-02 – 2013-06-05 (×4): 1 via ORAL
  Filled 2013-06-02 (×5): qty 1

## 2013-06-02 MED ORDER — ONDANSETRON 4 MG PO TBDP: 4.0000 mg | ORAL_TABLET | Freq: Four times a day (QID) | ORAL | Status: DC | PRN

## 2013-06-02 MED ORDER — THIAMINE HCL 100 MG/ML IJ SOLN
100.0000 mg | Freq: Every day | INTRAMUSCULAR | Status: DC
Start: 2013-06-02 — End: 2013-06-05

## 2013-06-02 NOTE — [facility] Suicide Risk Assessment (Signed)
Suicide Risk Assessment  Admission Assessment     Nursing information obtained from:  Patient Demographic factors:  Male;Caucasian;Low socioeconomic status Current Mental Status:  NA Loss Factors:  NA Historical Factors:  NA Risk Reduction Factors:  Sense of responsibility to family;Living with another person, especially a relative Total Time spent with patient: 45 minutes  CLINICAL FACTORS:   Depression:   Comorbid alcohol abuse/dependence Alcohol/Substance Abuse/Dependencies  COGNITIVE FEATURES THAT CONTRIBUTE TO RISK:  Closed-mindedness Polarized thinking Thought constriction (tunnel vision)    SUICIDE RISK:   Moderate:    PLAN OF CARE: Supportive approach/coping skills/relapse prevention                               Librium detox                               Reassess and address the co morbidities  I certify that inpatient services furnished can reasonably be expected to improve the patient's condition.  Winkler A 06/02/2013, 5:20 PM

## 2013-06-02 NOTE — BH Assessment (Signed)
Tele Assessment Note   Brandon Mullen is an 38 y.o. male presenting to [facility] requesting detox from alcohol. Pt reported that "I was sober for 3 years and now I am drinking nearly a fifth or more a day". Pt was unable to identify any relapse triggers at this time. Pt is alert and oriented x3. Pt denies SI/HI/AH/VH at the present time. Pt reported that he completed a detox program in the past and was able to remain sober for 3 years. Pt reported that he recently began drinking again and he doesn't know why. Pt stated multiple times throughout the assessment "I need to quit". Pt denied any illicit substance use.  Pt denied having any previous suicide attempts but reported that he was hospitalized due to having suicidal ideations in the past. Pt did not report any previous mental health treatment. Pt did not report any issues with sleep or appetite and did not endorse any depressive symptoms at this time. Pt denied any physical, sexual or emotional abuse at the present time.  Pt lives with his father and identified his parents as his support system. Pt reported that he is currently employed and does not have any issues at work due to his alcohol use. Pt reported that he is currently on probation .  Axis I: Alcohol Use Disorder, Moderate Axis II: Deferred Axis III:  Past Medical History  Diagnosis Date  . Alcohol abuse   . Hernia, umbilical    Axis IV: problems with primary support group Axis V: 41-50 serious symptoms  Past Medical History:  Past Medical History  Diagnosis Date  . Alcohol abuse   . Hernia, umbilical     Past Surgical History  Procedure Laterality Date  . Splenectomy, total      Family History: No family history on file.  Social History:  reports that he has never smoked. His smokeless tobacco use includes Chew. He reports that he drinks alcohol. He reports that he uses illicit drugs.  Additional Social History:  Alcohol / Drug Use Pain Medications: denies  abuse Prescriptions: denies abuse Over the Counter: denies abuse  History of alcohol / drug use?: Yes Longest period of sobriety (when/how long): 3 years  Negative Consequences of Use: Personal relationships Withdrawal Symptoms: Irritability Substance #1 Name of Substance 1: Alcohol  1 - Age of First Use: 11 1 - Amount (size/oz): "a fifth a or more" 1 - Frequency: daily  1 - Duration: 4 days  1 - Last Use / Amount: 06-01-13. "a fifth or more".   CIWA: CIWA-Ar BP: 163/81 mmHg Pulse Rate: 98 COWS:    Allergies: No Known Allergies  Home Medications:  (Not in a hospital admission)  OB/GYN Status:  No LMP for male patient.  General Assessment Data [facility] of Assessment: [facility] Is this a Tele or Face-to-Face Assessment?: Tele Assessment Is this an Initial Assessment or a Re-assessment for this encounter?: Initial Assessment Living Arrangements: Parent Can pt return to current living arrangement?: Yes Admission Status: Voluntary Is patient capable of signing voluntary admission?: Yes Transfer from: [facility] Referral Source: Self/Family/Friend     [place] Living Arrangements: Parent Name of Psychiatrist: n/a Name of Therapist: n/a  Education Status Is patient currently in school?: No  Risk to self Suicidal Ideation: No Suicidal Intent: No Is patient at risk for suicide?: No Suicidal Plan?: No Access to Means: No What has been your use of drugs/alcohol within the last 12 months?: daily  Previous Attempts/Gestures: Yes How  many times?: 1 Other Self Harm Risks: none identified at this time Triggers for Past Attempts: Unknown Intentional Self Injurious Behavior: None Family Suicide History: No Recent stressful life event(s): Other (Comment) (Pt reported that he is lonely. ) Persecutory voices/beliefs?: No Depression: No Depression Symptoms: Feeling angry/irritable Substance abuse history and/or treatment for substance abuse?: Yes Suicide  prevention information given to non-admitted patients: Not applicable  Risk to Others Homicidal Ideation: No Thoughts of Harm to Others: No Current Homicidal Intent: No Current Homicidal Plan: No Access to Homicidal Means: No Identified Victim: n/a History of harm to others?: No Assessment of Violence: None Noted Violent Behavior Description: n/a Does patient have access to weapons?: No Criminal Charges Pending?: No Does patient have a court date: No (Pt is currently on probation.)  Psychosis Hallucinations: None noted Delusions: None noted  Mental Status Report Appear/Hygiene: Other (Comment) (Hospital scrubs) Eye Contact: Good Motor Activity: Freedom of movement Speech: Logical/coherent Level of Consciousness: Alert Mood: Anxious Affect: Appropriate to circumstance Anxiety Level: Minimal Thought Processes: Coherent;Relevant Judgement: Unimpaired Orientation: Place;Time;Situation;Person Obsessive Compulsive Thoughts/Behaviors: None  Cognitive Functioning Concentration: Normal Memory: Recent Intact;Remote Intact IQ: Average Insight: Good Impulse Control: Fair Appetite: Good Weight Loss: 0 Weight Gain: 0 Sleep: No Change Total Hours of Sleep: 7 Vegetative Symptoms: None  ADLScreening [facility] Assessment Services) Patient's cognitive ability adequate to safely complete daily activities?: Yes Patient able to express need for assistance with ADLs?: Yes Independently performs ADLs?: Yes (appropriate for developmental age)  Prior Inpatient Therapy Prior Inpatient Therapy: Yes Prior Therapy Dates: 2012 Prior Therapy Facilty/Provider(s): [facility] Reason for Treatment: Detox  Prior Outpatient Therapy Prior Outpatient Therapy: No  ADL Screening (condition at time of admission) Patient's cognitive ability adequate to safely complete daily activities?: Yes Is the patient deaf or have difficulty hearing?: No Does the patient have difficulty seeing, even when wearing  glasses/contacts?: Yes Does the patient have difficulty concentrating, remembering, or making decisions?: No Patient able to express need for assistance with ADLs?: Yes Does the patient have difficulty dressing or bathing?: No Independently performs ADLs?: Yes (appropriate for developmental age) Does the patient have difficulty walking or climbing stairs?: No  Home Assistive Devices/Equipment Home Assistive Devices/Equipment: Eyeglasses    Abuse/Neglect Assessment (Assessment to be complete while patient is alone) Physical Abuse: Denies Verbal Abuse: Denies Sexual Abuse: Denies Exploitation of patient/patient's resources: Denies Self-Neglect: Denies Values / Beliefs Cultural Requests During Hospitalization: None Spiritual Requests During Hospitalization: None        Additional Information 1:1 In Past 12 Months?: No CIRT Risk: No Elopement Risk: No Does patient have medical clearance?: Yes     Disposition: Consulted with Serena Colonel, NP who agrees pt meets inpatient criteria. Notified Dr. Lita Mains of recommendations. Pt has been accepted to Room 305 Bed 2.  Disposition Initial Assessment Completed for this Encounter: Yes Disposition of Patient: Inpatient treatment program Type of inpatient treatment program: Adult  Mishawn Hemann S 06/02/2013 2:29 AM
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
## 2013-06-02 NOTE — Progress Notes (Signed)
Patient did attend the evening speaker AA meeting.  

## 2013-06-02 NOTE — [facility] Group Notes (Signed)
[facility] Group Notes:  (Nursing/MHT/Case Management/Adjunct)  Date:  06/02/2013  Time:  2:34 PM  Type of Therapy:  Psychoeducational Skills  Participation Level:  Did Not Attend  Summary of Progress/Problems: Pt did not attend video and discussion on how behavior effects addiction.  Jamala Kohen R 06/02/2013, 2:34 PM  

## 2013-06-02 NOTE — Progress Notes (Signed)
[facility] Group Notes:  (Nursing/MHT/Case Management/Adjunct)  Date:  06/02/2013  Time:  6:58 PM  Type of Therapy:  Psychoeducational Skills  Participation Level:  Did Not Attend  Participation Quality:    Affect:    Cognitive:    Insight:    Engagement in Group:    Modes of Intervention:    Summary of Progress/Problems: Patient did not attend group.   Tonia Brooms D 06/02/2013, 6:58 PM

## 2013-06-02 NOTE — BH Assessment (Signed)
Tele-assessment completed. Inpatient detox has been recommended. Pt has been accepted to [facility] Room 305 Bed 2.

## 2013-06-02 NOTE — BH Assessment (Signed)
Received a call for a tele-assessment. Spoke with Dr. Lita Mains who stated that patient is requesting alcohol detox. Pt has been sober for 3 years and relapse 3 days ago. Patient is not experiencing any withdrawal symptoms. Patient denies any SI/HI at the present time. Tele-assessment will be initiated.

## 2013-06-02 NOTE — [facility] Group Notes (Signed)
[facility] LCSW Group Therapy  06/02/2013 12:48 PM  Type of Therapy:  Group Therapy  Participation Level:  Did Not Attend  Participation Quality:  N/A  Affect:  N/A  Cognitive:  N/A  Insight:  N/A  Engagement in Therapy:  None  Modes of Intervention:  Discussion, Education, Exploration, Rapport Building, Socialization and Support  Summary of Progress/Problems: Did not attend group  Katha Hamming 06/02/2013, 12:48 PM

## 2013-06-02 NOTE — Progress Notes (Signed)
Admission note: Pt reports relapsing a few days ago on alcohol. Pt denies any stressors or triggers that led to him relapsing at this time. Pt denies SI/HI/AVH. Pt denies using any illegal drugs. Pt reports that he is currently living with his father.   Pt requested pain meds d/t having a headache and requested to go to sleep. Pt given tylenol for his pain. Pt safety maintained at this time and pt resting comfortably in his assigned bed.

## 2013-06-02 NOTE — ED Notes (Signed)
Pt transferred from triage, presents for medical clearance, requesting help for alcohol detox.  Pt reports he had been sober for 3 years and relapsed x 3 days ago.  Pt states I have been drinking a fifth of Vodka every day for past 3 days.  Last drink at 11pm.  Denies street drug use.  Denies SI, HI or AV hallucinations.  Denies feeling hopeless.  Pt reports last detox 3 yrs ago. Pt calm & cooperative at present.

## 2013-06-02 NOTE — Tx Team (Signed)
Initial Interdisciplinary Treatment Plan  PATIENT STRENGTHS: (choose at least two) Ability for insight Capable of independent living Motivation for treatment/growth  PATIENT STRESSORS: Substance abuse   PROBLEM LIST: Problem List/Patient Goals Date to be addressed Date deferred Reason deferred Estimated date of resolution  Substance abuse 06-02-13                                                      DISCHARGE CRITERIA:  Ability to meet basic life and health needs Adequate post-discharge living arrangements Improved stabilization in mood, thinking, and/or behavior Verbal commitment to aftercare and medication compliance Withdrawal symptoms are absent or subacute and managed without 24-hour nursing intervention  PRELIMINARY DISCHARGE PLAN: Attend aftercare/continuing care group Attend PHP/IOP  PATIENT/FAMIILY INVOLVEMENT: This treatment plan has been presented to and reviewed with the patient, Brandon Mullen, and/or family member.  The patient and family have been given the opportunity to ask questions and make suggestions.  Sherrell Weir L 06/02/2013, 5:46 AM

## 2013-06-02 NOTE — [facility] Group Notes (Signed)
[facility] Group Notes:  (Nursing/MHT/Case Management/Adjunct)  Date:  06/02/2013  Time:  10:32 AM  Type of Therapy:  Nurse Education:  Self-inventory   Participation Level:  Did Not Attend  Participation Quality:    Affect:    Cognitive:    Insight:    Engagement in Group:    Modes of Intervention:    Summary of Progress/Problems:  Judie Petit 06/02/2013, 10:32 AM

## 2013-06-02 NOTE — Progress Notes (Signed)
Patient ID: Brandon Mullen, male   DOB: 09-19-1975, 38 y.o.   MRN: [id number] Received report from [facility] and assumed pt care at 1100. D. Patient did endorse various withdrawal symptoms including sweats, tremors nausea and headaches. He states '' I am not feeling that great but I guess it could be worse. I'm mostly just feeling guilty because I fell off the wagon again.'' He reports drinking 1/5th liquor x 3 days. A. Medications given as ordered, including prn medications for withdrawal symptoms. R. Patient denies any acute concerns at this time. Will continue to monitor q 15 minutes for safety.

## 2013-06-02 NOTE — ED Notes (Signed)
TTS assessment with [facility] completed.

## 2013-06-02 NOTE — ED Provider Notes (Signed)
CSN: [id number]     Arrival date & time 06/01/13  2230 History   First MD Initiated Contact with Patient 06/01/13 2326     Chief Complaint  Patient presents with  . Medical Clearance     (Consider location/radiation/quality/duration/timing/severity/associated sxs/prior Treatment) HPI Patient presents to the emergency department requesting alcohol detox.  The patient, states he had a three-year sober.  But relapsed 3 days, ago.  The patient, states he's been binge drinking since that time.  He says his been drinking vodka and has consumed a half gallon each day of vodka.  Patient denies hallucinations, nausea, vomiting, diarrhea, back pain, abdominal pain, chest pain, shortness of breath, weakness, dizziness, shaking or syncope.  Patient, states he didn't take any medications prior to arrival.  Patient denies being suicidal, homicidal at this time Past Medical History  Diagnosis Date  . Alcohol abuse   . Hernia, umbilical    Past Surgical History  Procedure Laterality Date  . Splenectomy, total     No family history on file. History  Substance Use Topics  . Smoking status: Never Smoker   . Smokeless tobacco: Current User    Types: Chew  . Alcohol Use: Yes    Review of Systems  All other systems negative except as documented in the HPI. All pertinent positives and negatives as reviewed in the HPI.  Allergies  Review of patient's allergies indicates no known allergies.  Home Medications   Current Outpatient Rx  Name  Route  Sig  Dispense  Refill  . ibuprofen (ADVIL,MOTRIN) 200 MG tablet   Oral   Take 800 mg by mouth every 6 (six) hours as needed for moderate pain.         Marland Kitchen levothyroxine (SYNTHROID, LEVOTHROID) 150 MCG tablet   Oral   Take 150 mcg by mouth daily before breakfast.          BP 163/81  Pulse 98  Temp(Src) 98.8 F (37.1 C) (Oral)  Resp 16  Ht 5\' 8"  (1.727 m)  Wt 235 lb (106.595 kg)  BMI 35.74 kg/m2  SpO2 95% Physical Exam  Nursing note and  vitals reviewed. Constitutional: He is oriented to person, place, and time. He appears well-developed and well-nourished. No distress.  HENT:  Head: Normocephalic and atraumatic.  Mouth/Throat: Oropharynx is clear and moist.  Eyes: Pupils are equal, round, and reactive to light.  Neck: Normal range of motion. Neck supple.  Cardiovascular: Normal rate, regular rhythm and normal heart sounds.  Exam reveals no gallop and no friction rub.   No murmur heard. Pulmonary/Chest: Effort normal and breath sounds normal. No respiratory distress.  Neurological: He is alert and oriented to person, place, and time.  Skin: Skin is warm and dry.  Psychiatric: He has a normal mood and affect. His behavior is normal. Judgment and thought content normal.    ED Course  Procedures (including critical care time) Labs Review Labs Reviewed  CBC - Abnormal; Notable for the following:    Hemoglobin 17.1 (*)    All other components within normal limits  COMPREHENSIVE METABOLIC PANEL - Abnormal; Notable for the following:    Glucose, Bld 103 (*)    All other components within normal limits  ETHANOL - Abnormal; Notable for the following:    Alcohol, Ethyl (B) 295 (*)    All other components within normal limits  SALICYLATE LEVEL - Abnormal; Notable for the following:    Salicylate Lvl <0.8 (*)    All other components within normal  limits  ACETAMINOPHEN LEVEL  URINE RAPID DRUG SCREEN (HOSP PERFORMED)   advised the patient of the plan, and he'll be assessed by the behavioral health team.  All questions were answered and the patient voices an understanding  Brent General, PA-C 06/02/13 0102

## 2013-06-03 DIAGNOSIS — F411 Generalized anxiety disorder: Secondary | ICD-10-CM

## 2013-06-03 MED ORDER — HYDROCHLOROTHIAZIDE 12.5 MG PO CAPS
12.5000 mg | ORAL_CAPSULE | Freq: Once | ORAL | Status: AC
  Administered 2013-06-03: 12.5 mg via ORAL
  Filled 2013-06-03 (×2): qty 1

## 2013-06-03 MED ORDER — BUPROPION HCL ER (XL) 150 MG PO TB24
150.0000 mg | ORAL_TABLET | Freq: Every day | ORAL | Status: DC
  Administered 2013-06-03 – 2013-06-05 (×3): 150 mg via ORAL
  Filled 2013-06-03 (×4): qty 1

## 2013-06-03 MED ORDER — NICOTINE POLACRILEX 2 MG MT GUM: 2.0000 mg | CHEWING_GUM | OROMUCOSAL | Status: DC | PRN

## 2013-06-03 MED ORDER — QUETIAPINE FUMARATE 50 MG PO TABS
50.0000 mg | ORAL_TABLET | Freq: Every day | ORAL | Status: DC
  Administered 2013-06-03 – 2013-06-04 (×2): 50 mg via ORAL
  Filled 2013-06-03 (×3): qty 1

## 2013-06-03 NOTE — Progress Notes (Signed)
[facility] MD Progress Note  06/03/2013 4:35 PM Brandon Mullen  MRN:  [id number] Subjective:  Brandon Mullen endorses that he is still having a hard time. States he would like to have something that would help him with energy, motivation. He states he cant take anything that could be sedating as he works with Investment banker, operational. He is having a hard time with sleep as he worries too much and he goes to bed worrying. Diagnosis:   DSM5: Schizophrenia Disorders:  none Obsessive-Compulsive Disorders:  none Trauma-Stressor Disorders:  none Substance/Addictive Disorders:  Alcohol Related Disorder - Severe (303.90) Depressive Disorders:  Major Depressive Disorder - Moderate (296.22) Total Time spent with patient: 30 minutes  Axis I: Generalized Anxiety Disorder  ADL's:  Intact  Sleep: Poor  Appetite:  Fair  Suicidal Ideation:  Plan:  denies Intent:  denies Means:  denies Homicidal Ideation:  Plan:  denies Intent:  denies Means:  denies AEB (as evidenced by):  Psychiatric Specialty Exam: Physical Exam  Review of Systems  Constitutional: Positive for malaise/fatigue.  HENT: Negative.   Eyes: Negative.   Respiratory: Negative.   Cardiovascular: Negative.   Gastrointestinal: Negative.   Genitourinary: Negative.   Musculoskeletal: Negative.   Skin: Negative.   Neurological: Positive for weakness.  Endo/Heme/Allergies: Negative.   Psychiatric/Behavioral: Positive for depression and substance abuse. The patient is nervous/anxious.     Blood pressure 130/89, pulse 103, temperature 97.9 F (36.6 C), temperature source Oral, resp. rate 20, height 5' 9"  (1.753 m), weight 105.688 kg (233 lb), SpO2 94.00%.Body mass index is 34.39 kg/(m^2).  General Appearance: Fairly Groomed  Engineer, water::  Fair  Speech:  Clear and Coherent  Volume:  Decreased  Mood:  Anxious and Depressed  Affect:  Restricted  Thought Process:  Coherent and Goal Directed  Orientation:  Full (Time, Place, and Person)  Thought Content:   symtpoms, worries, concerns  Suicidal Thoughts:  No  Homicidal Thoughts:  No  Memory:  Immediate;   Fair Recent;   Fair Remote;   Fair  Judgement:  Fair  Insight:  Present  Psychomotor Activity:  Restlessness  Concentration:  Fair  Recall:  [employer] of Knowledge:NA  Language: Fair  Akathisia:  No  Handed:    AIMS (if indicated):     Assets:  Desire for Improvement Housing Vocational/Educational  Sleep:  Number of Hours: 6.75   Musculoskeletal: Strength & Muscle Tone: within normal limits Gait & Station: normal Patient leans: N/A  Current Medications: Current Facility-Administered Medications  Medication Dose Route Frequency Provider Last Rate Last Dose  . acetaminophen (TYLENOL) tablet 650 mg  650 mg Oral Q6H PRN Lurena Nida, NP   650 mg at 06/02/13 0519  . alum & mag hydroxide-simeth (MAALOX/MYLANTA) 200-200-20 MG/5ML suspension 30 mL  30 mL Oral PRN Lurena Nida, NP      . buPROPion (WELLBUTRIN XL) 24 hr tablet 150 mg  150 mg Oral Daily Nicholaus Bloom, MD   150 mg at 06/03/13 1338  . chlordiazePOXIDE (LIBRIUM) capsule 25 mg  25 mg Oral Q6H PRN Lurena Nida, NP   25 mg at 06/02/13 1119  . [START ON 06/04/2013] chlordiazePOXIDE (LIBRIUM) capsule 25 mg  25 mg Oral TID Lurena Nida, NP       Followed by  . [START ON 06/05/2013] chlordiazePOXIDE (LIBRIUM) capsule 25 mg  25 mg Oral BH-qamhs Lurena Nida, NP       Followed by  . [START ON 06/06/2013] chlordiazePOXIDE (LIBRIUM) capsule 25 mg  25 mg Oral Daily Lurena Nida, NP      . hydrOXYzine (ATARAX/VISTARIL) tablet 25 mg  25 mg Oral Q6H PRN Lurena Nida, NP   25 mg at 06/02/13 2132  . levothyroxine (SYNTHROID, LEVOTHROID) tablet 150 mcg  150 mcg Oral QAC breakfast Lurena Nida, NP   150 mcg at 06/03/13 0700  . loperamide (IMODIUM) capsule 2-4 mg  2-4 mg Oral PRN Lurena Nida, NP      . multivitamin with minerals tablet 1 tablet  1 tablet Oral Daily Lurena Nida, NP   1 tablet at 06/03/13 0753  . ondansetron (ZOFRAN-ODT)  disintegrating tablet 4 mg  4 mg Oral Q6H PRN Lurena Nida, NP      . QUEtiapine (SEROQUEL) tablet 50 mg  50 mg Oral QHS Nicholaus Bloom, MD      . thiamine (VITAMIN B-1) tablet 100 mg  100 mg Oral Daily Lurena Nida, NP   100 mg at 06/03/13 8938   Or  . thiamine (B-1) injection 100 mg  100 mg Intravenous Daily Lurena Nida, NP      . traZODone (DESYREL) tablet 50 mg  50 mg Oral QHS PRN Lurena Nida, NP   50 mg at 06/02/13 2132    Lab Results:  Results for orders placed during the hospital encounter of 06/01/13 (from the past 48 hour(s))  URINE RAPID DRUG SCREEN (HOSP PERFORMED)     Status: None   Collection Time    06/01/13 11:20 PM      Result Value Ref Range   Opiates NONE DETECTED  NONE DETECTED   Cocaine NONE DETECTED  NONE DETECTED   Benzodiazepines NONE DETECTED  NONE DETECTED   Amphetamines NONE DETECTED  NONE DETECTED   Tetrahydrocannabinol NONE DETECTED  NONE DETECTED   Barbiturates NONE DETECTED  NONE DETECTED   Comment:            DRUG SCREEN FOR MEDICAL PURPOSES     ONLY.  IF CONFIRMATION IS NEEDED     FOR ANY PURPOSE, NOTIFY LAB     WITHIN 5 DAYS.                LOWEST DETECTABLE LIMITS     FOR URINE DRUG SCREEN     Drug Class       Cutoff (ng/mL)     Amphetamine      1000     Barbiturate      200     Benzodiazepine   101     Tricyclics       751     Opiates          300     Cocaine          300     THC              50  ACETAMINOPHEN LEVEL     Status: None   Collection Time    06/01/13 11:22 PM      Result Value Ref Range   Acetaminophen (Tylenol), Serum <15.0  10 - 30 ug/mL   Comment:            THERAPEUTIC CONCENTRATIONS VARY     SIGNIFICANTLY. A RANGE OF 10-30     ug/mL MAY BE AN EFFECTIVE     CONCENTRATION FOR MANY PATIENTS.     HOWEVER, SOME ARE BEST TREATED     AT CONCENTRATIONS OUTSIDE THIS     RANGE.  ACETAMINOPHEN CONCENTRATIONS     >150 ug/mL AT 4 HOURS AFTER     INGESTION AND >50 ug/mL AT 12     HOURS AFTER INGESTION ARE     OFTEN  ASSOCIATED WITH TOXIC     REACTIONS.  CBC     Status: Abnormal   Collection Time    06/01/13 11:22 PM      Result Value Ref Range   WBC 9.7  4.0 - 10.5 K/uL   RBC 5.45  4.22 - 5.81 MIL/uL   Hemoglobin 17.1 (*) 13.0 - 17.0 g/dL   HCT 49.3  39.0 - 52.0 %   MCV 90.5  78.0 - 100.0 fL   MCH 31.4  26.0 - 34.0 pg   MCHC 34.7  30.0 - 36.0 g/dL   RDW 15.2  11.5 - 15.5 %   Platelets 332  150 - 400 K/uL  COMPREHENSIVE METABOLIC PANEL     Status: Abnormal   Collection Time    06/01/13 11:22 PM      Result Value Ref Range   Sodium 142  137 - 147 mEq/L   Potassium 3.7  3.7 - 5.3 mEq/L   Chloride 103  96 - 112 mEq/L   CO2 23  19 - 32 mEq/L   Glucose, Bld 103 (*) 70 - 99 mg/dL   BUN 10  6 - 23 mg/dL   Creatinine, Ser 0.83  0.50 - 1.35 mg/dL   Calcium 8.9  8.4 - 10.5 mg/dL   Total Protein 7.3  6.0 - 8.3 g/dL   Albumin 4.4  3.5 - 5.2 g/dL   AST 27  0 - 37 U/L   Comment: NO VISIBLE HEMOLYSIS   ALT 30  0 - 53 U/L   Alkaline Phosphatase 79  39 - 117 U/L   Total Bilirubin 0.3  0.3 - 1.2 mg/dL   GFR calc non Af Amer >90  >90 mL/min   GFR calc Af Amer >90  >90 mL/min   Comment: (NOTE)     The eGFR has been calculated using the CKD EPI equation.     This calculation has not been validated in all clinical situations.     eGFR's persistently <90 mL/min signify possible Chronic Kidney     Disease.  ETHANOL     Status: Abnormal   Collection Time    06/01/13 11:22 PM      Result Value Ref Range   Alcohol, Ethyl (B) 295 (*) 0 - 11 mg/dL   Comment:            LOWEST DETECTABLE LIMIT FOR     SERUM ALCOHOL IS 11 mg/dL     FOR MEDICAL PURPOSES ONLY  SALICYLATE LEVEL     Status: Abnormal   Collection Time    06/01/13 11:22 PM      Result Value Ref Range   Salicylate Lvl <9.4 (*) 2.8 - 20.0 mg/dL    Physical Findings: AIMS: Facial and Oral Movements Muscles of Facial Expression: None, normal Lips and Perioral Area: None, normal Jaw: None, normal Tongue: None, normal,Extremity  Movements Upper (arms, wrists, hands, fingers): None, normal Lower (legs, knees, ankles, toes): None, normal, Trunk Movements Neck, shoulders, hips: None, normal, Overall Severity Severity of abnormal movements (highest score from questions above): None, normal Incapacitation due to abnormal movements: None, normal Patient's awareness of abnormal movements (rate only patient's report): No Awareness, Dental Status Current problems with teeth and/or dentures?: No Does patient usually wear dentures?: No  CIWA:  CIWA-Ar  Total: 6 COWS:     Treatment Plan Summary: Daily contact with patient to assess and evaluate symptoms and progress in treatment Medication management  Plan: Supportive approach/coping skills/relapse prevention           Continue Detox            Trial with Wellbutrin Xl 150 mg in AM                              Seroquel 50 mg HS             TSH to check the status of his thyroid  Medical Decision Making Problem Points:  Review of psycho-social stressors (1) Data Points:  Review of medication regiment & side effects (2) Review of new medications or change in dosage (2)  I certify that inpatient services furnished can reasonably be expected to improve the patient's condition.   Jannetta Massey A 06/03/2013, 4:35 PM

## 2013-06-03 NOTE — ED Provider Notes (Signed)
Medical screening examination/treatment/procedure(s) were performed by non-physician practitioner and as supervising physician I was immediately available for consultation/collaboration.   EKG Interpretation None        Julianne Rice, MD 06/03/13 [phone]

## 2013-06-03 NOTE — BHH Counselor (Signed)
Adult Comprehensive Assessment  Patient ID: SHAUNN TACKITT, male   DOB: 1976-02-23, 38 y.o.   MRN: [id number]  Information Source: Information source: Patient  Current Stressors:  Educational / Learning stressors: N/A Employment / Job issues: N/A  Been working Architect for 2 plus years Family Relationships: N/A Museum/gallery curator / Lack of resources (include bankruptcy): N/A Housing / Lack of housing: Lives with father, step mother Physical health (include injuries & life threatening diseases): N/A Social relationships: Yes  Isolates.  States it is due to social anxiety Substance abuse: Yes  Half gallon liquor daily since last Wed relapse Bereavement / Loss: N/A  Living/Environment/Situation:  Living Arrangements: Parent Living conditions (as described by patient or guardian): Good How long has patient lived in current situation?: 1.5 years What is atmosphere in current home: Comfortable;Supportive  Family History:  Marital status: Single Does patient have children?: No  Childhood History:  By whom was/is the patient raised?: Mother/father and step-parent (mother and step father) Additional childhood history information: Did not get to know father until his 26's Description of patient's relationship with caregiver when they were a child: SF functional alcoholic  Mother was always angry Patient's description of current relationship with people who raised him/her: SF deceased.  Father and step mother, with whom he lives, are supportive.  Mother is in [place].  Has limited contact with her. Does patient have siblings?: No Did patient suffer any verbal/emotional/physical/sexual abuse as a child?: No Did patient suffer from severe childhood neglect?: No Has patient ever been sexually abused/assaulted/raped as an adolescent or adult?: No Was the patient ever a victim of a crime or a disaster?: No Witnessed domestic violence?: No Has patient been effected by domestic violence as an adult?:  No  Education:  Highest grade of school patient has completed: 31 Currently a Ship broker?: No Learning disability?: No  Employment/Work Situation:   Employment situation: Employed Where is patient currently employed?: Scientist, research (life sciences) How long has patient been employed?: 2 plus years Patient's job has been impacted by current illness: No What is the longest time patient has a held a job?: 10 years Where was the patient employed at that time?: various Architect businessess Has patient ever been in the [employer]?: No Has patient ever served in Recruitment consultant?: No  Financial Resources:   Financial resources: Income from employment Does patient have a representative payee or guardian?: No  Alcohol/Substance Abuse:   What has been your use of drugs/alcohol within the last 12 months?: Half gallon of liquor daily for 4 days Alcohol/Substance Abuse Treatment Hx: Past Tx, Inpatient;Attends AA/NA;Past Tx, Outpatient If yes, describe treatment: Multiple detox's,  [facility].  Was attending AA and had a sponser for a year following last detox here in 2012 when he was living at [place] [address] house. Has alcohol/substance abuse ever caused legal problems?: Yes (5 DUI's have resulted in probation, which will end when he pays off costs/fines.  Is not eligible for license until 8841.  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, yard work, Management consultant In what areas does patient struggle / problems for patient: sobriety  Discharge Plan:   Does  patient have access to transportation?: Yes Will patient be  returning to same living situation after discharge?: Yes Currently receiving community mental health services: No If no, would patient like referral for services when discharged?: Yes (What county[place] Sports coach) Does patient have financial barriers related to discharge medications?: No  Summary/Recommendations:   Summary and Recommendations (to be completed by the evaluator): Alexandre is a 38 YO Caucasian male who is here for detox.  He relapsed last Wed.  He is unable to identify any triggers.  Since last here, he stayed at [place] half way house for a year, and attended AA mtgs daily.  He spent 4 months in jail after that for a DUI charge that he had gotten prior to his last hospitalization.  From there, he went to stay with father and step mother, where he has been living since.  Of course, he stopped going to AA.  In his interview, he states that he has continued to be in weekly contact with [facility], and is "thinking about" the possibility of returning there to stay.  He will likely d/c from here back to his father's home.  He can benefit from crises stabilization, detox from alcohol, medication management for anxiety,  therapeutic milieu and referral for services.  Roque Lias B. 06/03/2013

## 2013-06-03 NOTE — Progress Notes (Signed)
  D) Patient appears sad and  depressed, patient is cooperative upon my assessment. Patient states "I'm just not feeling that good today." Offered patient medications as needed, he verbalizes understanding. Patient completed Patient Self Inventory, reports slept "poor," and  appetite is "improving." Patient rates depression as   5/10, patient rates hopeless feelings as  5/10. Patient denies SI/HI, denies A/V hallucinations.   A) Patient offered support and encouragement, patient encouraged to discuss feelings/concerns with staff. Patient verbalized understanding. Patient monitored Q15 minutes for safety. Patient met with MD  to discuss today's goals and plan of care.  R) Patient visible in milieu, attending some groups in day room and meals in dining room.Patient has a plan to go "back to AA."  Patient appropriate with staff and peers.   Patient taking medications as ordered. Will continue to monitor.

## 2013-06-03 NOTE — Progress Notes (Signed)
Patient did attend the evening speaker AA meeting.  

## 2013-06-03 NOTE — Progress Notes (Signed)
Adult Psychoeducational Group Note  Date:  06/03/2013 Time:  0915am   Group Topic/Focus:  Identifying Needs:   The focus of this group is to help patients identify their personal needs that have been historically problematic and identify healthy behaviors to address their needs.  Participation Level:  Did Not Attend  Participation Quality:  did not attend  Affect:  did not attend  Cognitive:  did not attend  Insight: None  Engagement in Group:  did not attend  Modes of Intervention:  Discussion and Education  Additional Comments:  Staff did encourage Alain to attend group, he remained in bed.  Marja Kays 06/03/2013, 9:54 AM

## 2013-06-04 DIAGNOSIS — E039 Hypothyroidism, unspecified: Secondary | ICD-10-CM | POA: Diagnosis present

## 2013-06-04 LAB — TSH: TSH: 14.35 u[IU]/mL — ABNORMAL HIGH (ref 0.350–4.500)

## 2013-06-04 MED ORDER — LISINOPRIL 20 MG PO TABS
20.0000 mg | ORAL_TABLET | Freq: Once | ORAL | Status: AC
  Administered 2013-06-04: 20 mg via ORAL
  Filled 2013-06-04 (×2): qty 1

## 2013-06-04 MED ORDER — LISINOPRIL 10 MG PO TABS
10.0000 mg | ORAL_TABLET | Freq: Every day | ORAL | Status: DC
  Administered 2013-06-05: 10 mg via ORAL
  Filled 2013-06-04 (×3): qty 1

## 2013-06-04 NOTE — Tx Team (Signed)
Interdisciplinary Treatment Plan Update (Adult)  Date: 06/04/2013   Time Reviewed: 12:04 PM  Progress in Treatment:  Attending groups: No.  Participating in groups:  No.  Taking medication as prescribed: Yes  Tolerating medication: Yes  Family/Significant othe contact made: No. SPE not required for this pt.  Patient understands diagnosis: Yes, AEB seeking treatment for [facility] detox/mood stabilization, and med management.  Discussing patient identified problems/goals with staff: Yes  Medical problems stabilized or resolved: Yes  Denies suicidal/homicidal ideation: Yes during admission/self report.  Patient has not harmed self or Others: Yes  New problem(s) identified:  Discharge Plan or Barriers: Pt did not attend d/c planning. CSW assessing.  Additional comments: 38 Y/O male who comes requesting detox. States he started drinking again after a prolonged period of sobriety. He claims he had not drank since he was here last in 2012 until couple of days ago. 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 He would like help with his "nerves" specially in he afternoon when he gets home from work. Cant identify any particular stressor other than the build up of the anxiety and the depression  Reason for Continuation of Hospitalization: Medication management Mood stabilization Estimated length of stay: 1-2 days  For review of initial/current patient goals, please see plan of care.  Attendees:  Patient:    Family:    Physician: Carlton Adam MD 06/04/2013 12:04 PM   Nursing: Butch Penny RN  06/04/2013 12:04 PM   Clinical Social Worker [place], [place]  06/04/2013 12:04 PM   Other: Oletta Darter. RN 06/04/2013 12:04 PM   Other: Hardie Pulley. PA 06/04/2013 12:04 PM   Other:    Other:    Scribe for Treatment Team:  [place] LCSWA 06/04/2013 12:04 PM

## 2013-06-04 NOTE — BHH Suicide Risk Assessment (Signed)
[facility] [facility] INPATIENT: Family/Significant Other Suicide Prevention Education  Suicide Prevention Education:  Education Completed; No one has been identified by the patient as the family member/significant other with whom the patient will be residing, and identified as the person(s) who will aid the patient in the event of a mental health crisis (suicidal ideations/suicide attempt).   Pt did not c/o SI at admission, nor have they endorsed SI during their stay here. SPE not required. SPI pamphlet provided to pt and he was encouraged to share information with support network, ask questions, and talk about any concerns relating to SPE.  [place], [place] 06/04/2013 12:17 PM

## 2013-06-04 NOTE — Progress Notes (Signed)
D: pt stated he is doing a lot better compared to this morning. Pt is calm and cooperative, appropriate interaction. denies si/hi/avh. denies pain. Fair eye contact, flat expression.  A: 1:1 time given. Support and encouragement offered. q 15 min safety checks R: pt remains safe on unit. No signs of distress or complaints at this time

## 2013-06-04 NOTE — Progress Notes (Signed)
Adult Psychoeducational Group Note  Date:  06/04/2013 Time:  10:00 am  Group Topic/Focus:  Wellness Toolbox:   The focus of this group is to discuss various aspects of wellness, balancing those aspects and exploring ways to increase the ability to experience wellness.  Patients will create a wellness toolbox for use upon discharge.  Participation Level:  Did Not Attend   Brandon Mullen 06/04/2013, 11:16 AM

## 2013-06-04 NOTE — Progress Notes (Signed)
D: Pt denies SI/HI/AVH. Pt is pleasant and cooperative. Pt stated he is ready to get back to work.   A: Pt was offered support and encouragement. Pt was given scheduled medications. Pt was encourage to attend groups. Q 15 minute checks were done for safety.   R:Pt attends groups and interacts well with peers and staff. Pt is taking medication. Pt has no complaints at this time.Pt receptive to treatment and safety maintained on unit.

## 2013-06-04 NOTE — Clinical Social Work Note (Addendum)
Per request from pt, CSW left voicemail for P.O. Jeannine Kitten [facility]) [phone] to inform him that pt is in hospital and unable to attend 4:30 meeting. CSW requested Officer Olson's direct fax in order to send hospital note (generic) at d/c.   [place], [place]  06/04/2013 3:39 PM

## 2013-06-04 NOTE — BHH Group Notes (Signed)
[place] LCSW Group Therapy  06/04/2013 3:34 PM  Type of Therapy:  Group Therapy  Participation Level:  Active  Participation Quality:  Attentive  Affect:  Appropriate  Cognitive:  Alert and Oriented  Insight:  Engaged  Engagement in Therapy:  Engaged  Modes of Intervention:  Confrontation, Discussion, Education, Exploration, Problem-solving, Rapport Building, Socialization and Support  Summary of Progress/Problems: Today's Topic: Overcoming Obstacles. Pt identified obstacles faced currently and processed barriers involved in overcoming these obstacles. Pt identified steps necessary for overcoming these obstacles and explored motivation (internal and external) for facing these difficulties head on. Pt further identified one area of concern in their lives and chose a skill of focus pulled from their "toolbox." Brandon Mullen was attentive and engaged throughout today's therapy group. He shared that he plans to go to [place] and rejoin AA groups/has sponsor. Pt reported that he relapsed a few days ago after 3 years of sobriety and was able to identify the ways that he was able tor remain sober for this length of time, showing improving insight. Brandon Mullen stated that [place] and his sponsor/support network were vital for his sobriety during the past three years.    [place], Saray Capasso LCSWA  06/04/2013, 3:34 PM

## 2013-06-04 NOTE — Progress Notes (Addendum)
Pt was given his prn doses of librium 25mg  and visteral 25mg  for anxiety. 5pm-Pt stated he felt better after taking his prn meds and feels less anxious.

## 2013-06-04 NOTE — BHH Group Notes (Signed)
[facility] LCSW Aftercare Discharge Planning Group Note   06/04/2013 10:00 AM  Participation Quality:  DID NOT ATTEND-pt in room sleeping/did not get up.   Smart, [employer]

## 2013-06-04 NOTE — Consult Note (Signed)
[facility] Medical Consultation  Brandon Mullen UXN:[id number] DOB: 1975-09-15 DOA: 06/02/2013 PCP: No primary provider on file.   Requesting physician: Morton Date of consultation: 4/6 Reason for consultation: ABNORMAL TSH.  Impression/Recommendations Active Problems:   Moderate alcohol use disorder   MDD (major depressive disorder), recurrent episode   Anxiety state, unspecified HYPOTHYROIDISM.    1. Hypothyroidism: - abnormal tsh.  - get free t3 and free t4.  - get ekg.  - currently resume synthroid at the same dose, we will make changes after we get the free t3 and freet 4 levels.   2. Alcohol detox: - as per sychiatry.   3. MDD: Resume home medications.   I will followup again tomorrow. Please contact me if I can be of assistance in the meanwhile. Thank you for this consultation.  Chief Complaint: Admitted for alcohol detox.  HPI:  38 year old gentleman with a prior h/o hypothyroidism, alcohol abuse, umbilical hernia, admitted to [facility] for alcohol detox. Medical service consulted for abnormal TSH. He currently denies any complaints. . He reports he feels good some days and some days he doesn't feel like doing anything.  He reports he gets tsh checked every year at [facility] clinic and gets medicines from samples.his TSH is 14.   Review of Systems:  See HPI other wise negative.   Past Medical History  Diagnosis Date  . Alcohol abuse   . Hernia, umbilical   . Hypothyroidism    Past Surgical History  Procedure Laterality Date  . Splenectomy, total     Social History:  reports that he has never smoked. His smokeless tobacco use includes Chew. He reports that he drinks alcohol. He reports that he uses illicit drugs.  No Known Allergies History reviewed. No pertinent family history.  Prior to Admission medications   Medication Sig Start Date End Date Taking? Authorizing Provider  ibuprofen (ADVIL,MOTRIN) 200 MG tablet Take 800 mg by mouth every 6 (six) hours  as needed for moderate pain.    Historical Provider, MD  levothyroxine (SYNTHROID, LEVOTHROID) 150 MCG tablet Take 150 mcg by mouth daily before breakfast.    Historical Provider, MD   Physical Exam: Blood pressure 145/88, pulse 101, temperature 97.4 F (36.3 C), temperature source Oral, resp. rate 20, height 5\' 9"  (1.753 m), weight 105.688 kg (233 lb), SpO2 94.00%. Filed Vitals:   06/04/13 0730  BP: 145/88  Pulse: 101  Temp: 97.4 F (36.3 C)  Resp: 20    Constitutional: Vital signs reviewed.  Patient is a well-developed and well-nourished  in no acute distress and cooperative with exam. Alert and oriented x3.  Neck: Supple, Trachea midline normal ROM, No JVD, mass, thyromegaly, or carotid bruit present.  Cardiovascular: RRR, S1 normal, S2 normal, no MRG, pulses symmetric and intact bilaterally Pulmonary/Chest: normal respiratory effort, CTAB, no wheezes, rales, or rhonchi Abdominal: Soft. Non-tender, non-distended, bowel sounds are normal, no masses, organomegaly, or guarding present.  GU: no CVA tenderness Musculoskeletal: No joint deformities, erythema, or stiffness, ROM full and no nontender Hematology: no cervical, inginal, or axillary adenopathy.  Neurological: A&O x3, grossly normal. Skin: Warm, dry and intact. No rash, cyanosis, or clubbing.     Labs on Admission:  Basic Metabolic Panel:  Recent Labs Lab 06/01/13 2322  NA 142  K 3.7  CL 103  CO2 23  GLUCOSE 103*  BUN 10  CREATININE 0.83  CALCIUM 8.9   Liver Function Tests:  Recent Labs Lab 06/01/13 2322  AST 27  ALT  30  ALKPHOS 79  BILITOT 0.3  PROT 7.3  ALBUMIN 4.4   No results found for this basename: LIPASE, AMYLASE,  in the last 168 hours No results found for this basename: AMMONIA,  in the last 168 hours CBC:  Recent Labs Lab 06/01/13 2322  WBC 9.7  HGB 17.1*  HCT 49.3  MCV 90.5  PLT 332   Cardiac Enzymes: No results found for this basename: CKTOTAL, CKMB, CKMBINDEX, TROPONINI,  in the  last 168 hours BNP: No components found with this basename: POCBNP,  CBG: No results found for this basename: GLUCAP,  in the last 168 hours  Radiological Exams on Admission: No results found.  EKG: pending.   Time spent: 45 min  [facility] Pager [phone]  If 7PM-7AM, please contact night-coverage [email or web address] Password [email or web address] 06/04/2013, 7:11 PM

## 2013-06-04 NOTE — Progress Notes (Signed)
[facility] MD Progress Note  06/04/2013 1:52 PM Brandon Mullen  MRN:  [id number] Subjective:  Brandon Mullen endorses that he did sleep better with the Seroquel last night. He tolerated the first two dosages of Wellbutrin. His TSH is 14. 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 He realizes that his tiredness and being sluggish   could be secondary to the uncontrolled hypothyroidism.  Diagnosis:   DSM5: Schizophrenia Disorders:  none Obsessive-Compulsive Disorders:  none Trauma-Stressor Disorders:  none Substance/Addictive Disorders:  Alcohol Intoxication with Use Disorder - Moderate (F10.229) Depressive Disorders:  Major Depressive Disorder - Moderate (296.22) Total Time spent with patient: 30 minutes  Axis I: Generalized Anxiety Disorder  ADL's:  Intact  Sleep: Fair  Appetite:  Fair  Suicidal Ideation:  Plan:  denies Intent:  denies Means:  denies Homicidal Ideation:  Plan:  denies Intent:  denies Means:  denies AEB (as evidenced by):  Psychiatric Specialty Exam: Physical Exam  Review of Systems  Constitutional: Positive for malaise/fatigue.  HENT: Negative.   Eyes: Negative.   Respiratory: Negative.   Cardiovascular: Negative.   Gastrointestinal: Negative.   Genitourinary: Negative.   Musculoskeletal: Negative.   Skin: Negative.   Neurological: Positive for weakness.  Endo/Heme/Allergies: Negative.   Psychiatric/Behavioral: Positive for depression. The patient is nervous/anxious.     Blood pressure 145/88, pulse 101, temperature 97.4 F (36.3 C), temperature source Oral, resp. rate 20, height 5\' 9"  (1.753 m), weight 105.688 kg (233 lb), SpO2 94.00%.Body mass index is 34.39 kg/(m^2).  General Appearance: Fairly Groomed  Engineer, water::  Fair  Speech:  Clear and Coherent, Slow and not spontaneous  Volume:  Decreased  Mood:  Anxious and Depressed  Affect:  Restricted  Thought Process:   Coherent and Goal Directed  Orientation:  Full (Time, Place, and Person)  Thought Content:  symtpoms, worries, concerns  Suicidal Thoughts:  No  Homicidal Thoughts:  No  Memory:  Immediate;   Fair Recent;   Fair Remote;   Fair  Judgement:  Fair  Insight:  Present  Psychomotor Activity:  Decreased  Concentration:  Fair  Recall:  [employer] of Knowledge:NA  Language: Fair  Akathisia:  No  Handed:    AIMS (if indicated):     Assets:  Desire for Improvement Housing Vocational/Educational  Sleep:  Number of Hours: 6.75   Musculoskeletal: Strength & Muscle Tone: within normal limits Gait & Station: normal Patient leans: N/A  Current Medications: Current Facility-Administered Medications  Medication Dose Route Frequency Provider Last Rate Last Dose  . acetaminophen (TYLENOL) tablet 650 mg  650 mg Oral Q6H PRN Lurena Nida, NP   650 mg at 06/02/13 0519  . alum & mag hydroxide-simeth (MAALOX/MYLANTA) 200-200-20 MG/5ML suspension 30 mL  30 mL Oral PRN Lurena Nida, NP      . buPROPion (WELLBUTRIN XL) 24 hr tablet 150 mg  150 mg Oral Daily Nicholaus Bloom, MD   150 mg at 06/04/13 0836  . chlordiazePOXIDE (LIBRIUM) capsule 25 mg  25 mg Oral Q6H PRN Nicholaus Bloom, MD   25 mg at 06/02/13 1119  . hydrOXYzine (ATARAX/VISTARIL) tablet 25 mg  25 mg Oral Q6H PRN Lurena Nida, NP   25 mg at 06/02/13 2132  . levothyroxine (SYNTHROID, LEVOTHROID) tablet 150 mcg  150 mcg Oral QAC breakfast Lurena Nida, NP   150 mcg at 06/04/13 7062  .  lisinopril (PRINIVIL,ZESTRIL) tablet 10 mg  10 mg Oral Daily Encarnacion Slates, NP      . loperamide (IMODIUM) capsule 2-4 mg  2-4 mg Oral PRN Lurena Nida, NP      . multivitamin with minerals tablet 1 tablet  1 tablet Oral Daily Lurena Nida, NP   1 tablet at 06/04/13 4696  . nicotine polacrilex (NICORETTE) gum 2 mg  2 mg Oral PRN Nicholaus Bloom, MD      . ondansetron (ZOFRAN-ODT) disintegrating tablet 4 mg  4 mg Oral Q6H PRN Lurena Nida, NP      . QUEtiapine  (SEROQUEL) tablet 50 mg  50 mg Oral QHS Nicholaus Bloom, MD   50 mg at 06/03/13 2117  . thiamine (VITAMIN B-1) tablet 100 mg  100 mg Oral Daily Lurena Nida, NP   100 mg at 06/04/13 2952   Or  . thiamine (B-1) injection 100 mg  100 mg Intravenous Daily Lurena Nida, NP      . traZODone (DESYREL) tablet 50 mg  50 mg Oral QHS PRN Lurena Nida, NP   50 mg at 06/02/13 2132    Lab Results:  Results for orders placed during the hospital encounter of 06/02/13 (from the past 48 hour(s))  TSH     Status: Abnormal   Collection Time    06/03/13  7:10 PM      Result Value Ref Range   TSH 14.350 (*) 0.350 - 4.500 uIU/mL   Comment: Please note change in reference range.     Performed at [facility]    Physical Findings: AIMS: Facial and Oral Movements Muscles of Facial Expression: None, normal Lips and Perioral Area: None, normal Jaw: None, normal Tongue: None, normal,Extremity Movements Upper (arms, wrists, hands, fingers): None, normal Lower (legs, knees, ankles, toes): None, normal, Trunk Movements Neck, shoulders, hips: None, normal, Overall Severity Severity of abnormal movements (highest score from questions above): None, normal Incapacitation due to abnormal movements: None, normal Patient's awareness of abnormal movements (rate only patient's report): No Awareness, Dental Status Current problems with teeth and/or dentures?: No Does patient usually wear dentures?: No  CIWA:  CIWA-Ar Total: 7 COWS:     Treatment Plan Summary: Daily contact with patient to assess and evaluate symptoms and progress in treatment Medication management  Plan: Supportive approach/coping skills/relapse prevention           Continue Wellbutrin XL 150 mg in AM, Seroquel 50 mg HS           Consult internal medicine to help manage his hypothyroidism  Medical Decision Making Problem Points:  Established problem, worsening (2) and Review of psycho-social stressors (1) Data Points:  Review or order  clinical lab tests (1) Review of medication regiment & side effects (2)  I certify that inpatient services furnished can reasonably be expected to improve the patient's condition.   [name] A 06/04/2013, 1:52 PM

## 2013-06-04 NOTE — Progress Notes (Signed)
Patient ID: Brandon Mullen, male   DOB: 1976-02-24, 38 y.o.   MRN: [id number]  D: Patient pleasant on approach today. Reports depression "5" and hopelessness "2" on scales. Currently denies any SI/HI or a/v hallucinations. His plan is to go back to [employer] on discharge. A: Staff will continue on q 15 minute checks, follow treatment plan, and give meds as ordered. R: Cooperative on the unit.

## 2013-06-05 DIAGNOSIS — F339 Major depressive disorder, recurrent, unspecified: Secondary | ICD-10-CM

## 2013-06-05 DIAGNOSIS — E039 Hypothyroidism, unspecified: Secondary | ICD-10-CM

## 2013-06-05 DIAGNOSIS — F411 Generalized anxiety disorder: Secondary | ICD-10-CM

## 2013-06-05 DIAGNOSIS — F1010 Alcohol abuse, uncomplicated: Secondary | ICD-10-CM

## 2013-06-05 DIAGNOSIS — F1020 Alcohol dependence, uncomplicated: Principal | ICD-10-CM

## 2013-06-05 LAB — T4, FREE: Free T4: 1.11 ng/dL (ref 0.80–1.80)

## 2013-06-05 LAB — T3, FREE: T3, Free: 2.8 pg/mL (ref 2.3–4.2)

## 2013-06-05 MED ORDER — QUETIAPINE FUMARATE 50 MG PO TABS: 50.0000 mg | ORAL_TABLET | Freq: Every day | ORAL | Status: DC

## 2013-06-05 MED ORDER — LISINOPRIL 10 MG PO TABS: 10.0000 mg | ORAL_TABLET | Freq: Every day | ORAL | Status: DC

## 2013-06-05 MED ORDER — BUPROPION HCL ER (XL) 150 MG PO TB24: 150.0000 mg | ORAL_TABLET | Freq: Every day | ORAL | Status: DC

## 2013-06-05 MED ORDER — TRAZODONE HCL 50 MG PO TABS: 50.0000 mg | ORAL_TABLET | Freq: Every evening | ORAL | Status: DC | PRN

## 2013-06-05 MED ORDER — LEVOTHYROXINE SODIUM 150 MCG PO TABS: 150.0000 ug | ORAL_TABLET | Freq: Every day | ORAL | Status: DC

## 2013-06-05 NOTE — [facility] Group Notes (Signed)
Did not attend 0900 group today

## 2013-06-05 NOTE — Discharge Summary (Signed)
Physician Discharge Summary Note  Patient:  Brandon Mullen is an 38 y.o., male MRN:  [id number] DOB:  1975/12/06 Patient phone:  [phone] (home)  Patient address:   [address] [place] [address],  Total Time spent with patient: Greater than 30 minutes  Date of Admission:  06/02/2013  Date of Discharge: 06/05/13  Reason for Admission:  Alcohol detox  Discharge Diagnoses: Active Problems:   Moderate alcohol use disorder   MDD (major depressive disorder), recurrent episode   Anxiety state, unspecified   Unspecified hypothyroidism   Psychiatric Specialty Exam: Physical Exam  Psychiatric: His speech is normal and behavior is normal. Judgment and thought content normal. His mood appears not anxious. His affect is not angry, not blunt, not labile and not inappropriate. Cognition and memory are normal. He does not exhibit a depressed mood.    Review of Systems  Constitutional: Negative.   HENT: Negative.   Eyes: Negative.   Respiratory: Negative.   Cardiovascular: Negative.   Gastrointestinal: Negative.   Genitourinary: Negative.   Musculoskeletal: Negative.   Skin: Negative.   Neurological: Negative.   Endo/Heme/Allergies: Negative.   Psychiatric/Behavioral: Positive for depression (Stable) and substance abuse (Alcoholism, chronic). Negative for suicidal ideas, hallucinations and memory loss. The patient has insomnia (Stable). The patient is not nervous/anxious.     Blood pressure 121/84, pulse 94, temperature 97.9 F (36.6 C), temperature source Oral, resp. rate 20, height 5\' 9"  (1.753 m), weight 105.688 kg (233 lb), SpO2 94.00%.Body mass index is 34.39 kg/(m^2).   General Appearance: Fairly Groomed   Engineer, water:: Fair   Speech: Clear and Coherent   Volume: Decreased   Mood: worried   Affect: Appropriate   Thought Process: Coherent and Goal Directed   Orientation: Full (Time, Place, and Person)   Thought Content: concerns, relapse prevention plan, need to go  to work in AM   Suicidal Thoughts: No   Homicidal Thoughts: No   Memory: Immediate; Fair  Recent; Fair  Remote; Fair   Judgement: Fair   Insight: Present   Psychomotor Activity: Normal   Concentration: Fair   Recall: [employer] of Knowledge:NA   Language: Fair   Akathisia: No   Handed:   AIMS (if indicated):   Assets: Desire for Improvement  Housing  Social Support  Vocational/Educational   Sleep: Number of Hours: 6.75    Past Psychiatric History: Diagnosis: Alcohol dependence,   Major depressive disorder, recurrent episodes  Hospitalizations: [facility] adult unit  Outpatient Care: [facility]  Substance Abuse Care: [facility]  Self-Mutilation: NA  Suicidal Attempts: NA  Violent Behaviors: NA   Musculoskeletal: Strength & Muscle Tone: within normal limits Gait & Station: normal Patient leans: N/A  DSM5: Schizophrenia Disorders:  NA Obsessive-Compulsive Disorders:  NA Trauma-Stressor Disorders:  NA Substance/Addictive Disorders:  Alcohol Related Disorder - Moderate (303.90) Depressive Disorders:  MDD (major depressive disorder), recurrent episode Anxiety state, unspecified   Axis Diagnosis:  AXIS I:  Alcohol dependence,   Major depressive disorder, recurrent episodes AXIS II:  Deferred AXIS III:   Past Medical History  Diagnosis Date  . Alcohol abuse   . Hernia, umbilical   . Hypothyroidism    AXIS IV:  other psychosocial or environmental problems and Alcoholism AXIS V:  62  Level of Care:  OP  Hospital Course: 38 Y/O male who comes requesting detox. States he started drinking again after a prolonged period of sobriety. He claims he had not drank since he was here last in 2012 until couple  of days ago. 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 He would like help with his "nerves" specially in he  afternoon when he gets home from work. Cant identify any particular stressor other than the build up of the anxiety and the depression.  Cace was admitted to the hospital with a blood alcohol level of 295 per toxicology tests. He was intoxicated requiring detox treatment. He also presented with complaints of high anxiety levels and worsening depression. He required mood stabilization. He was started on Librium detox protocols. He was also medicated with Wellbutrin XL 150 mg daily for depression, Seroquel 50 mg at bedtime for mood control and Trazodone 50 mg Q bedtime for sleep. He was started on Lisinopril 10 mg daily for HTN and received primary care consult for evaluation of his thyroid functions as his TSH lab reports was unsually elevated even though Meshulem reported that he was compliant in taking his Thyroid hormone replacement medicine. He enrolled in the group sessions and AA/NA meetings being offered on this unit. He learned coping skills. Jesten tolerated his treatment regimen without any significant adverse effects and or reactions.  Raciel has completed detox treatment and his mood/anxiety stabilized. He is currently being discharged to his home to follow-up care at the [facility] clinic here in [place], [place]. He is discharged on the high blood pressure medication and instructed/encouraged to follow-up care with his primary care physician after discharge. Upon discharge, Gaven adamantly denies any SIHI, AVH, delusional thoughts, paranoia and or withdrawal symptoms. He was provided with a 14 days worth supply samples of his [facility] discharge medications. He left [facility] with all personal belongings in no distress. Transportation per friend.  Consults:  psychiatry and Primary care  Significant Diagnostic Studies:  labs: CBC with diff, CMP, UDS, toxicology tests, U/A, TSH, T3, T4  Discharge Vitals:   Blood pressure 121/84, pulse 94, temperature 97.9 F (36.6 C), temperature source Oral, resp. rate  20, height 5\' 9"  (1.753 m), weight 105.688 kg (233 lb), SpO2 94.00%. Body mass index is 34.39 kg/(m^2). Lab Results:   Results for orders placed during the hospital encounter of 06/02/13 (from the past 72 hour(s))  TSH     Status: Abnormal   Collection Time    06/03/13  7:10 PM      Result Value Ref Range   TSH 14.350 (*) 0.350 - 4.500 uIU/mL   Comment: Please note change in reference range.     Performed at [facility]  T4, FREE     Status: None   Collection Time    06/04/13  7:20 PM      Result Value Ref Range   Free T4 1.11  0.80 - 1.80 ng/dL   Comment: Performed at [facility]  T3, FREE     Status: None   Collection Time    06/04/13  7:20 PM      Result Value Ref Range   T3, Free 2.8  2.3 - 4.2 pg/mL   Comment: Performed at [facility]    Physical Findings: AIMS: Facial and Oral Movements Muscles of Facial Expression: None, normal Lips and Perioral Area: None, normal Jaw: None, normal Tongue: None, normal,Extremity Movements Upper (arms, wrists, hands, fingers): None, normal Lower (legs, knees, ankles, toes): None, normal, Trunk Movements Neck,  shoulders, hips: None, normal, Overall Severity Severity of abnormal movements (highest score from questions above): None, normal Incapacitation due to abnormal movements: None, normal Patient's awareness of abnormal movements (rate only patient's report): No Awareness, Dental Status Current problems with teeth and/or dentures?: No Does patient usually wear dentures?: No  CIWA:  CIWA-Ar Total: 7 COWS:     Psychiatric Specialty Exam: See Psychiatric Specialty Exam and Suicide Risk Assessment completed by Attending Physician prior to discharge.  Discharge destination:  Home  Is patient on multiple antipsychotic therapies at discharge:  No   Has Patient had three or more failed trials of antipsychotic monotherapy by history:  No  Recommended Plan for Multiple Antipsychotic Therapies: NA      Medication List    STOP taking these medications       ibuprofen 200 MG tablet  Commonly known as:  ADVIL,MOTRIN      TAKE these medications     Indication   buPROPion 150 MG 24 hr tablet  Commonly known as:  WELLBUTRIN XL  Take 1 tablet (150 mg total) by mouth daily. For depression   Indication:  Major Depressive Disorder     levothyroxine 150 MCG tablet  Commonly known as:  SYNTHROID, LEVOTHROID  Take 1 tablet (150 mcg total) by mouth daily before breakfast. For low thyroid function   Indication:  Underactive Thyroid     lisinopril 10 MG tablet  Commonly known as:  PRINIVIL,ZESTRIL  Take 1 tablet (10 mg total) by mouth daily. For hypertension   Indication:  High Blood Pressure     QUEtiapine 50 MG tablet  Commonly known as:  SEROQUEL  Take 1 tablet (50 mg total) by mouth at bedtime. For mood control/sleep   Indication:  Trouble Sleeping, Mood control, Insomnia     traZODone 50 MG tablet  Commonly known as:  DESYREL  Take 1 tablet (50 mg total) by mouth at bedtime as needed for sleep.   Indication:  Trouble Sleeping       Follow-up Information   Follow up with [facility] On 06/08/2013. (Walk in between 8am-9am Monday through Friday for hospital followup/medication management/assessment for therapy services. )    Contact information:   [address] [place] [address] Phone: [phone] Fax; [phone]     Follow-up recommendations: Activity:  As tolerated Diet: As recommended by your primary care doctor. Keep all scheduled follow-up appointments as recommended.   Comments:  Take all your medications as prescribed by your mental healthcare provider. Report any adverse effects and or reactions from your medicines to your outpatient provider promptly. Patient is instructed and cautioned to not engage in alcohol and or illegal drug use while on prescription medicines. In the event of worsening symptoms, patient is instructed to call the crisis hotline, 911 and  or go to the nearest ED for appropriate evaluation and treatment of symptoms. Follow-up with your primary care provider for your other medical issues, concerns and or health care needs.   Total Discharge Time:  Greater than 30 minutes.  Signed: Encarnacion Slates, PMHNP-BC 06/05/2013, 3:15 PM Personally evaluated the patient, and agree with assessment and plan Geralyn Flash A. Sabra Heck, M.D.

## 2013-06-05 NOTE — Progress Notes (Signed)
Adult Psychoeducational Group Note  Date:  06/05/2013 Time:  3:30 AM  Group Topic/Focus:  Wrap-Up Group:   The focus of this group is to help patients review their daily goal of treatment and discuss progress on daily workbooks.  Participation Level:  Active  Participation Quality:  Attentive  Affect:  Appropriate  Cognitive:  Appropriate  Insight: Good  Engagement in Group:  Engaged    Additional Comments: Patient didn't have any feedback.  Leialoha Hanna, [place] 06/05/2013, 3:30 AM

## 2013-06-05 NOTE — [facility] Suicide Risk Assessment (Signed)
Suicide Risk Assessment  Discharge Assessment     Demographic Factors:  Male and Caucasian  Total Time spent with patient: 45 minutes  Psychiatric Specialty Exam:     Blood pressure 121/84, pulse 94, temperature 97.9 F (36.6 C), temperature source Oral, resp. rate 20, height 5\' 9"  (1.753 m), weight 105.688 kg (233 lb), SpO2 94.00%.Body mass index is 34.39 kg/(m^2).  General Appearance: Fairly Groomed  Engineer, water::  Fair  Speech:  Clear and Coherent  Volume:  Decreased  Mood:  worried  Affect:  Appropriate  Thought Process:  Coherent and Goal Directed  Orientation:  Full (Time, Place, and Person)  Thought Content:  concerns, relapse prevention plan, need to go to work in AM  Suicidal Thoughts:  No  Homicidal Thoughts:  No  Memory:  Immediate;   Fair Recent;   Fair Remote;   Fair  Judgement:  Fair  Insight:  Present  Psychomotor Activity:  Normal  Concentration:  Fair  Recall:  [employer] of Knowledge:NA  Language: Fair  Akathisia:  No  Handed:    AIMS (if indicated):     Assets:  Desire for Improvement Housing Social Support Vocational/Educational  Sleep:  Number of Hours: 6.75    Musculoskeletal: Strength & Muscle Tone: within normal limits Gait & Station: normal Patient leans: N/A   Mental Status Per Nursing Assessment::   On Admission:  NA  Current Mental Status by Physician: In full contact with reality. There are no active S/S of withdrawal. He is committed to abstinence. Needs to be at work tomorrow. Feels ready. Aware of the need to follow up about his thyroid and blood pressure   Loss Factors: NA  Historical Factors: NA  Risk Reduction Factors:   Employed and Living with another person, especially a relative  Continued Clinical Symptoms:  Depression:   Comorbid alcohol abuse/dependence Alcohol/Substance Abuse/Dependencies  Cognitive Features That Contribute To Risk:  Closed-mindedness Polarized thinking Thought constriction (tunnel  vision)    Suicide Risk:  Minimal: No identifiable suicidal ideation.  Patients presenting with no risk factors but with morbid ruminations; may be classified as minimal risk based on the severity of the depressive symptoms  Discharge Diagnoses:   AXIS I:  Alcohol Dependence, MDD, Anxiety NOS AXIS II:  Deferred AXIS III:   Past Medical History  Diagnosis Date  . Alcohol abuse   . Hernia, umbilical   . Hypothyroidism    AXIS IV:  other psychosocial or environmental problems AXIS V:  61-70 mild symptoms  Plan Of Care/Follow-up recommendations:  Activity:  as tolerated Diet:  regular  Is patient on multiple antipsychotic therapies at discharge:  No   Has Patient had three or more failed trials of antipsychotic monotherapy by history:  No  Recommended Plan for Multiple Antipsychotic Therapies: NA    Mindee Robledo A 06/05/2013, 11:32 AM

## 2013-06-05 NOTE — Consult Note (Signed)
[facility] PROGRESS NOTE  Brandon Mullen LPF:[id number] DOB: 02/10/1976 DOA: 06/02/2013 PCP: No primary provider on file.  Assessment/Plan:  Active Problems:  Moderate alcohol use disorder  MDD (major depressive disorder), recurrent episode  Anxiety state, unspecified  HYPOTHYROIDISM.   38 year old gentleman with a prior h/o hypothyroidism, alcohol abuse, umbilical hernia, admitted to [facility] for alcohol detox  1.Hypothyroidism:abnormal tsh.  free t3 and free t4 are WNL; cont currently synthroid at the same dose, outpatient TSH in 4 weeks  2. Alcohol detox:  - as per sychiatry.  3. MDD:  as per sychiatry.   D/w patient  I will sign off; Please contact me if I can be of assistance in the meanwhile. Thank you for this consultation   Code Status: full Family Communication: d/w patient  (indicate person spoken with, relationship, and if by phone, the number) Disposition Plan: home per psychiatry    Consultants:  nonen  Procedures:  None   Antibiotics:  None  (indicate start date, and stop date if known)  HPI/Subjective: alert  Objective: Filed Vitals:   06/05/13 0701  BP: 121/84  Pulse: 94  Temp:   Resp:    No intake or output data in the 24 hours ending 06/05/13 1236 Filed Weights   06/02/13 0505  Weight: 105.688 kg (233 lb)    Exam:   General:  alert  Cardiovascular: s1,s2 rrr  Respiratory: CTA BL  Abdomen: soft, nt,nd   Musculoskeletal: no LE edema   Data Reviewed: Basic Metabolic Panel:  Recent Labs Lab 06/01/13 2322  NA 142  K 3.7  CL 103  CO2 23  GLUCOSE 103*  BUN 10  CREATININE 0.83  CALCIUM 8.9   Liver Function Tests:  Recent Labs Lab 06/01/13 2322  AST 27  ALT 30  ALKPHOS 79  BILITOT 0.3  PROT 7.3  ALBUMIN 4.4   No results found for this basename: LIPASE, AMYLASE,  in the last 168 hours No results found for this basename: AMMONIA,  in the last 168 hours CBC:  Recent Labs Lab 06/01/13 2322  WBC 9.7  HGB  17.1*  HCT 49.3  MCV 90.5  PLT 332   Cardiac Enzymes: No results found for this basename: CKTOTAL, CKMB, CKMBINDEX, TROPONINI,  in the last 168 hours BNP (last 3 results) No results found for this basename: PROBNP,  in the last 8760 hours CBG: No results found for this basename: GLUCAP,  in the last 168 hours  No results found for this or any previous visit (from the past 240 hour(s)).   Studies: No results found.  Scheduled Meds: . buPROPion  150 mg Oral Daily  . levothyroxine  150 mcg Oral QAC breakfast  . lisinopril  10 mg Oral Daily  . multivitamin with minerals  1 tablet Oral Daily  . QUEtiapine  50 mg Oral QHS  . thiamine  100 mg Oral Daily   Or  . thiamine  100 mg Intravenous Daily   Continuous Infusions:   Active Problems:   Moderate alcohol use disorder   MDD (major depressive disorder), recurrent episode   Anxiety state, unspecified   Unspecified hypothyroidism    Time spent: >35 minutes     Kinnie Feil  [facility] Pager [phone]. If 7PM-7AM, please contact night-coverage at [email or web address], password [facility] 06/05/2013, 12:36 PM  LOS: 3 days

## 2013-06-05 NOTE — Progress Notes (Signed)
[facility] Adult Case Management Discharge Plan :  Will you be returning to the same living situation after discharge: Yes,  returning home At discharge, do you have transportation home?:Yes,  states that a friend will pick pt up Do you have the ability to pay for your medications:Yes,  provided pt with samples and prescriptions and referred pt to [facility] for assistance with affording meds  Release of information consent forms completed and in the chart;  Patient's signature needed at discharge.  Patient to Follow up at: Follow-up Information   Follow up with [facility] On 06/08/2013. (Walk in between 8am-9am Monday through Friday for hospital followup/medication management/assessment for therapy services. )    Contact information:   [address] [place] [address] Phone: [phone] Fax; [phone]      Patient denies SI/HI:   Yes,  denies SI/HI    Safety Planning and Suicide Prevention discussed:  Yes,  discussed with pt.  N/A to contact family/friend due to no SI on admission.  See suicide prevention education note.   CSW left a voicemail message, per pt's request, for pt's probation officer, Officer Jeannine Kitten [facility]) [phone].  CSW informed Officer Jeannine Kitten of pt's admission and d/c date.    Ane Payment 06/05/2013, 11:29 AM

## 2013-06-05 NOTE — Progress Notes (Signed)
D:  Per pt self inventory pt reports sleeping poor, appetite improving, energy level normal, ability to pay attention poor, rates depression at a 5 out of 10 and hopelessness at an 8 out of 10, denies SI/HI/AVH, flat/depressed during interaction.     A:  Emotional support provided, Encouraged pt to continue with treatment plan and attend all group activities, q15 min checks maintained for safety.  R:  Pt is calm and cooperative, going to some groups, is planning to d/c today.

## 2013-06-05 NOTE — Progress Notes (Signed)
Adult Psychoeducational Group Note  Date:  06/05/2013 Time:  10:00 am  Group Topic/Focus:  Recovery Goals:   The focus of this group is to identify appropriate goals for recovery and establish a plan to achieve them.  Participation Level:  Did Not Attend   Madelyn Flavors 06/05/2013, 2:15 PM

## 2013-06-05 NOTE — Progress Notes (Signed)
D/C instructions/meds/follow-up appointments reviewed, pt verbalized understanding, pt's belongings returned to pt, samples given, bus pass given. 

## 2013-06-08 NOTE — Progress Notes (Signed)
Patient Discharge Instructions:  After Visit Summary (AVS):   Faxed to:  06/08/13 Discharge Summary Note:   Faxed to:  06/08/13 Psychiatric Admission Assessment Note:   Faxed to:  06/08/13 Suicide Risk Assessment - Discharge Assessment:   Faxed to:  06/08/13 Faxed/Sent to the Next Level Care provider:  06/08/13 Faxed to [facility] [place], 06/08/2013, 4:53 PM

## 2013-06-22 ENCOUNTER — Encounter (HOSPITAL_COMMUNITY): Payer: Self-pay | Admitting: Emergency Medicine

## 2013-06-22 ENCOUNTER — Emergency Department (HOSPITAL_COMMUNITY)
Admission: EM | Admit: 2013-06-22 | Discharge: 2013-06-23 | Disposition: A | Discharge status: Other Acute Inpatient Hospital | Payer: Self-pay | Attending: Emergency Medicine | Admitting: Emergency Medicine

## 2013-06-22 DIAGNOSIS — Z79899 Other long term (current) drug therapy: Secondary | ICD-10-CM | POA: Insufficient documentation

## 2013-06-22 DIAGNOSIS — E039 Hypothyroidism, unspecified: Secondary | ICD-10-CM | POA: Insufficient documentation

## 2013-06-22 DIAGNOSIS — F1010 Alcohol abuse, uncomplicated: Secondary | ICD-10-CM | POA: Insufficient documentation

## 2013-06-22 DIAGNOSIS — Z8719 Personal history of other diseases of the digestive system: Secondary | ICD-10-CM | POA: Insufficient documentation

## 2013-06-22 DIAGNOSIS — F411 Generalized anxiety disorder: Secondary | ICD-10-CM | POA: Insufficient documentation

## 2013-06-22 DIAGNOSIS — R110 Nausea: Secondary | ICD-10-CM | POA: Insufficient documentation

## 2013-06-22 DIAGNOSIS — R51 Headache: Secondary | ICD-10-CM | POA: Insufficient documentation

## 2013-06-22 HISTORY — DX: Essential (primary) hypertension: I10

## 2013-06-22 LAB — COMPREHENSIVE METABOLIC PANEL
ALT: 29 U/L (ref 0–53)
AST: 31 U/L (ref 0–37)
Albumin: 4.3 g/dL (ref 3.5–5.2)
Alkaline Phosphatase: 71 U/L (ref 39–117)
BUN: 8 mg/dL (ref 6–23)
CO2: 23 mEq/L (ref 19–32)
Calcium: 8.8 mg/dL (ref 8.4–10.5)
Chloride: 102 mEq/L (ref 96–112)
Creatinine, Ser: 0.8 mg/dL (ref 0.50–1.35)
GFR calc Af Amer: >90 mL/min (ref >90)
GFR calc non Af Amer: >90 mL/min (ref >90)
Glucose, Bld: 94 mg/dL (ref 70–99)
Potassium: 3.8 mEq/L (ref 3.7–5.3)
Sodium: 140 mEq/L (ref 137–147)
Total Bilirubin: 0.6 mg/dL (ref 0.3–1.2)
Total Protein: 7.1 g/dL (ref 6.0–8.3)

## 2013-06-22 LAB — RAPID URINE DRUG SCREEN, HOSP PERFORMED
Amphetamines: NOT DETECTED
Barbiturates: NOT DETECTED
Benzodiazepines: POSITIVE — AB
Cocaine: NOT DETECTED
Opiates: NOT DETECTED
Tetrahydrocannabinol: NOT DETECTED

## 2013-06-22 LAB — CBC
HCT: 48.1 % (ref 39.0–52.0)
Hemoglobin: 16.7 g/dL (ref 13.0–17.0)
MCH: 31.5 pg (ref 26.0–34.0)
MCHC: 34.7 g/dL (ref 30.0–36.0)
MCV: 90.6 fL (ref 78.0–100.0)
Platelets: 312 10*3/uL (ref 150–400)
RBC: 5.31 MIL/uL (ref 4.22–5.81)
RDW: 16.3 % — ABNORMAL HIGH (ref 11.5–15.5)
WBC: 10.7 10*3/uL — ABNORMAL HIGH (ref 4.0–10.5)

## 2013-06-22 LAB — SALICYLATE LEVEL: Salicylate Lvl: <2 mg/dL — ABNORMAL LOW (ref 2.8–20.0)

## 2013-06-22 LAB — ETHANOL: Alcohol, Ethyl (B): 225 mg/dL — ABNORMAL HIGH (ref 0–11)

## 2013-06-22 LAB — ACETAMINOPHEN LEVEL: Acetaminophen (Tylenol), Serum: <15 ug/mL (ref 10–30)

## 2013-06-22 MED ORDER — ONDANSETRON HCL 4 MG PO TABS: 4.0000 mg | ORAL_TABLET | Freq: Three times a day (TID) | ORAL | Status: DC | PRN

## 2013-06-22 MED ORDER — ONDANSETRON 8 MG PO TBDP
8.0000 mg | ORAL_TABLET | Freq: Once | ORAL | Status: AC
  Administered 2013-06-22: 8 mg via ORAL
  Filled 2013-06-22: qty 1

## 2013-06-22 MED ORDER — VITAMIN B-1 100 MG PO TABS
100.0000 mg | ORAL_TABLET | Freq: Every day | ORAL | Status: DC
  Administered 2013-06-22: 100 mg via ORAL
  Filled 2013-06-22: qty 1

## 2013-06-22 MED ORDER — LORAZEPAM 1 MG PO TABS: 0.0000 mg | ORAL_TABLET | Freq: Two times a day (BID) | ORAL | Status: DC

## 2013-06-22 MED ORDER — LORAZEPAM 1 MG PO TABS
0.0000 mg | ORAL_TABLET | Freq: Four times a day (QID) | ORAL | Status: DC
  Administered 2013-06-22: 2 mg via ORAL
  Filled 2013-06-22: qty 2

## 2013-06-22 MED ORDER — IBUPROFEN 200 MG PO TABS: 600.0000 mg | ORAL_TABLET | Freq: Three times a day (TID) | ORAL | Status: DC | PRN

## 2013-06-22 MED ORDER — ACETAMINOPHEN 325 MG PO TABS
650.0000 mg | ORAL_TABLET | ORAL | Status: DC | PRN
  Filled 2013-06-22: qty 2

## 2013-06-22 MED ORDER — THIAMINE HCL 100 MG/ML IJ SOLN: 100.0000 mg | Freq: Every day | INTRAMUSCULAR | Status: DC

## 2013-06-22 MED ORDER — NICOTINE 21 MG/24HR TD PT24
21.0000 mg | MEDICATED_PATCH | Freq: Every day | TRANSDERMAL | Status: DC
  Administered 2013-06-22: 21 mg via TRANSDERMAL
  Filled 2013-06-22: qty 1

## 2013-06-22 NOTE — Progress Notes (Signed)
Per Manus Gunning patient has been accepted to the OBS unit.

## 2013-06-22 NOTE — ED Provider Notes (Signed)
CSN: [id number]     Arrival date & time 06/22/13  2000 History   First MD Initiated Contact with Patient 06/22/13 2005     Chief Complaint  Patient presents with  . Medical Clearance     (Consider location/radiation/quality/duration/timing/severity/associated sxs/prior Treatment) HPI Brandon Mullen is a 38 y.o. male who presents to emergency department requesting alcohol detox. Patient states that he has been drinking daily for the last several years. States he drinks large amount of liquor a white. He states he was recently at behavioral health for detox, but states he left before he was completely detoxed so he could go back to work and began drinking the same day. Patient states that he wants to get help this time and is willing to stay for course. He denies any history of DTs or seizures. His last drink was prior to coming here. He reports associated nausea at present and a headache. He denies any vomiting. He denies any abdominal pain. He denies any chest pain or shortness of breath. He states he drank 2 fifths and a pint of wine today. He denies any suicidal homicidal ideations. He states he is currently on antidepressants but states she's not taking them because he is unable to work when he saw him. 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  Past Medical History  Diagnosis Date  . Alcohol abuse   . Hernia, umbilical   . Hypothyroidism    Past Surgical History  Procedure Laterality Date  . Splenectomy, total     No family history on file. History  Substance Use Topics  . Smoking status: Never Smoker   . Smokeless tobacco: Current User    Types: Chew  . Alcohol Use: Yes    Review of Systems  Constitutional: Negative for fever and chills.  Respiratory: Negative for cough, chest tightness and shortness of breath.   Cardiovascular: Negative for chest pain, palpitations and leg swelling.  Gastrointestinal: Positive for nausea. Negative for vomiting, abdominal  pain, diarrhea and abdominal distention.  Genitourinary: Negative for dysuria, urgency, frequency and hematuria.  Skin: Negative for rash.  Allergic/Immunologic: Negative for immunocompromised state.  Neurological: Positive for headaches. Negative for dizziness, weakness, light-headedness and numbness.  Psychiatric/Behavioral: The patient is nervous/anxious.       Allergies  Review of patient's allergies indicates no known allergies.  Home Medications   Prior to Admission medications   Medication Sig Start Date End Date Taking? Authorizing Provider  buPROPion (WELLBUTRIN XL) 150 MG 24 hr tablet Take 1 tablet (150 mg total) by mouth daily. For depression 06/05/13  Yes Encarnacion Slates, NP  levothyroxine (SYNTHROID, LEVOTHROID) 150 MCG tablet Take 1 tablet (150 mcg total) by mouth daily before breakfast. For low thyroid function 06/05/13  Yes Encarnacion Slates, NP  lisinopril (PRINIVIL,ZESTRIL) 10 MG tablet Take 1 tablet (10 mg total) by mouth daily. For hypertension 06/05/13  Yes Encarnacion Slates, NP  QUEtiapine (SEROQUEL) 50 MG tablet Take 1 tablet (50 mg total) by mouth at bedtime. For mood control/sleep 06/05/13  Yes Encarnacion Slates, NP  traZODone (DESYREL) 50 MG tablet Take 1 tablet (50 mg total) by mouth at bedtime as needed for sleep. 06/05/13  Yes Encarnacion Slates, NP   BP 131/92  Pulse 103  Temp(Src) 98 F (36.7 C) (Oral)  Resp 20  Wt 240 lb (108.863 kg)  SpO2 93% Physical Exam  Nursing note and vitals reviewed. Constitutional: He appears well-developed and well-nourished. No distress.  HENT:  Head: Normocephalic and atraumatic.  Eyes: Conjunctivae and EOM are normal. Pupils are equal, round, and reactive to light.  Neck: Neck supple.  Cardiovascular: Normal rate, regular rhythm and normal heart sounds.   Pulmonary/Chest: Effort normal. No respiratory distress. He has no wheezes. He has no rales.  Abdominal: Soft. Bowel sounds are normal. He exhibits no distension. There is no tenderness. There  is no rebound.  Musculoskeletal: He exhibits no edema.  Neurological: He is alert.  Skin: Skin is warm and dry.    ED Course  Procedures (including critical care time) Labs Review Labs Reviewed  CBC - Abnormal; Notable for the following:    WBC 10.7 (*)    RDW 16.3 (*)    All other components within normal limits  ETHANOL - Abnormal; Notable for the following:    Alcohol, Ethyl (B) 225 (*)    All other components within normal limits  SALICYLATE LEVEL - Abnormal; Notable for the following:    Salicylate Lvl <6.0 (*)    All other components within normal limits  URINE RAPID DRUG SCREEN (HOSP PERFORMED) - Abnormal; Notable for the following:    Benzodiazepines POSITIVE (*)    All other components within normal limits  ACETAMINOPHEN LEVEL  COMPREHENSIVE METABOLIC PANEL    Imaging Review No results found.   EKG Interpretation None      MDM   Final diagnoses:  None   Pt here for alcohol detox. No SI or HI. Medically cleared.   Filed Vitals:   06/22/13 2011 06/22/13 2300  BP: 131/92 118/72  Pulse: 103 87  Temp: 98 F (36.7 C)   TempSrc: Oral   Resp: 20   Weight: 240 lb (108.863 kg)   SpO2: 93%        Renold Genta, PA-C 06/23/13 [phone]

## 2013-06-22 NOTE — ED Notes (Signed)
Pt requesting detox from alcohol. Pt states he was inpt for 72 hours at [facility] beginning 4/4 after has been bing drinking since he was released. Pt states she has consumed 2 5th and a pint of wine today. +nausea. No tremors or vomiting noted.

## 2013-06-22 NOTE — BH Assessment (Signed)
Assessment Note  Brandon Mullen is an 38 y.o. male. Pt brought to ED by father requesting Alcohol Detox. Pt reports drinking 1-2 fifths daily since he was discharged from [facility] this month. Pt drowsy and sleeping during assessment. Frequently had to be awaken and redirected to assessment questions. Pt admits to not following up with [facility] and not attending [place] meetings. Pt denies any medication use. UDS + benzo. Pt denies SI/HI, no prior SA; -A/V L[place], verbally contracts for safety. Pt reports being depressed and having anxiety but minimum symptoms reported to Probation officer.  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 Pt reports "3-4 DWI", last one in 2014, unsure of court date again but states he is on probation and owes money. Hx HTN and hypothyroidism and non-complaint with medication.   Axis I: Alcohol Abuse and Depressive Disorder NOS Axis II: Deferred Axis III:  Past Medical History  Diagnosis Date  . Alcohol abuse   . Hernia, umbilical   . Hypothyroidism   . Hypertension    Axis IV: occupational problems, other psychosocial or environmental problems and problems related to legal system/crime Axis V: 31-40 impairment in reality testing  Past Medical History:  Past Medical History  Diagnosis Date  . Alcohol abuse   . Hernia, umbilical   . Hypothyroidism   . Hypertension     Past Surgical History  Procedure Laterality Date  . Splenectomy, total      Family History: History reviewed. No pertinent family history.  Social History:  reports that he has never smoked. His smokeless tobacco use includes Chew. He reports that he drinks alcohol. He reports that he uses illicit drugs.  Additional Social History:  Alcohol / Drug Use Pain Medications: denies Prescriptions: denies  Over the Counter: denies History of alcohol / drug use?: Yes Longest period of sobriety (when/how long): 3 years  Negative Consequences of Use:  Financial;Legal;Personal relationships;Work / Youth worker Withdrawal Symptoms: Weakness Substance #1 Name of Substance 1: Alcohol  1 - Age of First Use: 11 1 - Amount (size/oz): "1-2 fifths a day" 1 - Frequency: daily  1 - Duration: "since left hospital" 1 - Last Use / Amount: today  CIWA: CIWA-Ar BP: 118/72 mmHg Pulse Rate: 87 Nausea and Vomiting: no nausea and no vomiting Tactile Disturbances: none Tremor: no tremor Auditory Disturbances: not present Paroxysmal Sweats: no sweat visible Visual Disturbances: not present Anxiety: two Headache, Fullness in Head: moderate Agitation: normal activity Orientation and Clouding of Sensorium: cannot do serial additions or is uncertain about date (Pt intoxicated and answered "I don't know") CIWA-Ar Total: 6 COWS: Clinical Opiate Withdrawal Scale (COWS) Resting Pulse Rate: Pulse Rate 81-100 Sweating: No report of chills or flushing Restlessness: Able to sit still Pupil Size: Pupils pinned or normal size for room light Bone or Joint Aches: Not present Runny Nose or Tearing: Not present GI Upset: No GI symptoms Tremor: No tremor Yawning: Yawning once or twice during assessment Anxiety or Irritability: None Gooseflesh Skin: Skin is smooth COWS Total Score: 2  Allergies: No Known Allergies  Home Medications:  (Not in a hospital admission)  OB/GYN Status:  No LMP for male patient.  General Assessment Data Location of Assessment: [facility] ED Is this a Tele or Face-to-Face Assessment?: Face-to-Face Is this an Initial Assessment or a Re-assessment for this encounter?: Initial Assessment Living Arrangements: Parent Can pt return to current living arrangement?: Yes Admission Status: Voluntary Is patient capable of signing voluntary admission?: Yes Transfer  from: Home Referral Source: Self/Family/Friend  Medical Screening Exam ([place]) Medical Exam completed: Yes  [place] Living Arrangements: Parent Name of  Psychiatrist: none Name of Therapist: none  Education Status Is patient currently in school?: No Current Grade: n/a Highest grade of school patient has completed: n/a Name of school: n/a Contact person: n/a  Risk to self Suicidal Ideation: No Suicidal Intent: No Is patient at risk for suicide?: No Suicidal Plan?: No Access to Means: No What has been your use of drugs/alcohol within the last 12 months?: 1/5 to 2 ligour daily Previous Attempts/Gestures: No How many times?: 0 Family Suicide History: No Recent stressful life event(s): Financial Problems;Legal Issues;Loss (Comment) (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) Persecutory voices/beliefs?: No Depression: Yes Depression Symptoms: Feeling worthless/self pity Substance abuse history and/or treatment for substance abuse?: Yes Suicide prevention information given to non-admitted patients: Not applicable  Risk to Others Homicidal Ideation: No Thoughts of Harm to Others: No Current Homicidal Intent: No Current Homicidal Plan: No Access to Homicidal Means: No History of harm to others?: No Assessment of Violence: None Noted Does patient have access to weapons?: No Criminal Charges Pending?: Yes Describe Pending Criminal Charges: DWI 2014 Does patient have a court date: No (Pt states not sure)  Psychosis Hallucinations: None noted Delusions: None noted  Mental Status Report Appear/Hygiene: Disheveled;Body odor Eye Contact: Fair Motor Activity: Unremarkable Speech: Slurred Level of Consciousness: Drowsy;Sleeping Mood: Helpless Affect: Other (Comment) (pt intoxicated and difficult to assess) Anxiety Level: Minimal Thought Processes: Coherent;Relevant Judgement: Impaired Orientation: Person;Place;Situation Obsessive Compulsive Thoughts/Behaviors: None  Cognitive Functioning Concentration: Normal Memory: Recent Intact;Remote Intact IQ: Average Insight: Poor Impulse Control: Poor Appetite:  Fair Weight Loss:  (denies) Weight Gain:  (denies) Sleep: No Change Total Hours of Sleep:  (unsure)  ADLScreening [facility] Assessment Services) Patient's cognitive ability adequate to safely complete daily activities?: Yes Patient able to express need for assistance with ADLs?: Yes Independently performs ADLs?: Yes (appropriate for developmental age)  Prior Inpatient Therapy Prior Inpatient Therapy: Yes [facility]) Prior Therapy Dates: 06/03/13 (3 admits to [facility]) Prior Therapy Facilty/Provider(s): [facility] Reason for Treatment: Alcohol and anxiety  Prior Outpatient Therapy Prior Outpatient Therapy: Yes Prior Therapy Dates: [facility] Reason for Treatment: Alcohol Dep  ADL Screening (condition at time of admission) Patient's cognitive ability adequate to safely complete daily activities?: Yes Is the patient deaf or have difficulty hearing?: No Does the patient have difficulty seeing, even when wearing glasses/contacts?: No Does the patient have difficulty concentrating, remembering, or making decisions?: No Patient able to express need for assistance with ADLs?: Yes Does the patient have difficulty dressing or bathing?: No Independently performs ADLs?: Yes (appropriate for developmental age) Does the patient have difficulty walking or climbing stairs?: No Weakness of Legs: None Weakness of Arms/Hands: None  Home Assistive Devices/Equipment Home Assistive Devices/Equipment: None  Therapy Consults (therapy consults require a physician order) PT Evaluation Needed: No OT Evalulation Needed: No SLP Evaluation Needed: No Abuse/Neglect Assessment (Assessment to be complete while patient is alone) Physical Abuse: Denies Verbal Abuse: Denies Sexual Abuse: Denies Exploitation of patient/patient's resources: Denies Self-Neglect: Denies Values / Beliefs Cultural Requests During Hospitalization: None Spiritual Requests During Hospitalization: None Consults Spiritual Care Consult Needed:  No Social Work Consult Needed: No Regulatory affairs officer (For Healthcare) Advance Directive: Patient does not have advance directive;Patient would not like information Pre-existing out of facility DNR order (yellow form or pink MOST form): No Nutrition Screen- [facility] Patient's home diet: Regular  Additional Information 1:1 In Past 12 Months?: No CIRT Risk: No Elopement Risk: No Does patient have medical clearance?: Yes     Disposition:  Disposition Initial Assessment Completed for this Encounter: Yes Disposition of Patient: Referred to (observation unit)  On Site Evaluation by:   Reviewed with Physician:    Apolinar Junes 06/22/2013 11:44 PM

## 2013-06-22 NOTE — Progress Notes (Signed)
  CARE MANAGEMENT ED NOTE 06/22/2013  Patient:  Brandon Mullen, Brandon Mullen   Account Number:  [id number]  Date Initiated:  06/22/2013  Documentation initiated by:  Livia Snellen  Subjective/Objective Assessment:   Patient presents to Ed requesting detox from alcohol     Subjective/Objective Assessment Detail:   Patient recently at [facility] for alcohol detox.     Action/Plan:   Action/Plan Detail:   Anticipated DC Date:       Status Recommendation to Physician:   Result of Recommendation:    Other ED [place]  Other  PCP issues    Choice offered to / List presented to:            Status of service:  Completed, signed off  ED Comments:   ED Comments Detail:  EDCM spoke to patient at bedside.  Patient confirms he does not have a pcp or insurance living in [place]. EDCM providedpatient with a list of pcps who accept self pay patients, list of financial resources in the community sucha as local churches and salvation [employer],urban ministries, list ofd iscounted pharmacies and websites needymeds.org and Good https://www.henry-hernandez.biz/ for medication assistance, phone number to inquire about the orange card, Medicaid and Affordable care act.  Aslo informed patient about dental program through orange card.  [facility] provided patient with phone number and address of [place]. Patient reports he was a patient at the [facility] clinic in the past. Patient reports, "I work but I don't have the money to pay for a doctor visit." Patient thankful for resources.  No further EDCM needs at this time.

## 2013-06-22 NOTE — Progress Notes (Deleted)
  CARE MANAGEMENT ED NOTE 06/22/2013  Patient:  Brandon Mullen,Brandon Mullen   Account Number:  [id number]  Date Initiated:  06/22/2013  Documentation initiated by:  Livia Snellen  Subjective/Objective Assessment:   Patient presents to Ed from [facility] for detox     Subjective/Objective Assessment Detail:   Patient with pmhx of ETOH abuse     Action/Plan:   Action/Plan Detail:   Anticipated DC Date:       Status Recommendation to Physician:   Result of Recommendation:    Other ED [place]  Other  PCP issues    Choice offered to / List presented to:            Status of service:  Completed, signed off  ED Comments:   ED Comments Detail:  EDCM spoke to patient at bedside.  Patient confirms he does not have a pcp or insurance living in [place]. EDCM providedpatient with a list of pcps who accept self pay patients, list of financial resources in the community sucha as local churches and salvation [employer],urban ministries, list of  discounted pharmacies and websites needymeds.org and Good https://www.henry-[email or web address]/ for medication assistance, phone number to inquire about the orange card, Medicaid and Affordable care act.  Aslo informed patient about dental program through orange card.  [facility] provided patient with phone number and address of [place]. Patient thankful for resources.  No further EDCM needs at this time.

## 2013-06-23 ENCOUNTER — Observation Stay (HOSPITAL_COMMUNITY)
Admission: AD | Admit: 2013-06-23 | Discharge: 2013-06-23 | Payer: Self-pay | Attending: Psychiatry | Admitting: Psychiatry

## 2013-06-23 ENCOUNTER — Encounter (HOSPITAL_COMMUNITY): Payer: Self-pay | Admitting: *Deleted

## 2013-06-23 DIAGNOSIS — F3289 Other specified depressive episodes: Secondary | ICD-10-CM

## 2013-06-23 DIAGNOSIS — F339 Major depressive disorder, recurrent, unspecified: Secondary | ICD-10-CM

## 2013-06-23 DIAGNOSIS — F411 Generalized anxiety disorder: Secondary | ICD-10-CM

## 2013-06-23 DIAGNOSIS — F1020 Alcohol dependence, uncomplicated: Secondary | ICD-10-CM

## 2013-06-23 DIAGNOSIS — F1994 Other psychoactive substance use, unspecified with psychoactive substance-induced mood disorder: Secondary | ICD-10-CM

## 2013-06-23 DIAGNOSIS — I10 Essential (primary) hypertension: Secondary | ICD-10-CM | POA: Insufficient documentation

## 2013-06-23 DIAGNOSIS — E039 Hypothyroidism, unspecified: Secondary | ICD-10-CM | POA: Insufficient documentation

## 2013-06-23 DIAGNOSIS — F17200 Nicotine dependence, unspecified, uncomplicated: Secondary | ICD-10-CM | POA: Insufficient documentation

## 2013-06-23 DIAGNOSIS — F329 Major depressive disorder, single episode, unspecified: Secondary | ICD-10-CM

## 2013-06-23 DIAGNOSIS — F1010 Alcohol abuse, uncomplicated: Principal | ICD-10-CM | POA: Diagnosis present

## 2013-06-23 DIAGNOSIS — K429 Umbilical hernia without obstruction or gangrene: Secondary | ICD-10-CM | POA: Insufficient documentation

## 2013-06-23 DIAGNOSIS — F1910 Other psychoactive substance abuse, uncomplicated: Secondary | ICD-10-CM

## 2013-06-23 MED ORDER — CHLORDIAZEPOXIDE HCL 25 MG PO CAPS: 25.0000 mg | ORAL_CAPSULE | Freq: Three times a day (TID) | ORAL | Status: DC

## 2013-06-23 MED ORDER — ONDANSETRON 4 MG PO TBDP: 4.0000 mg | ORAL_TABLET | Freq: Four times a day (QID) | ORAL | Status: DC | PRN

## 2013-06-23 MED ORDER — ADULT MULTIVITAMIN W/MINERALS CH
1.0000 | ORAL_TABLET | Freq: Every day | ORAL | Status: DC
  Administered 2013-06-23: 1 via ORAL
  Filled 2013-06-23 (×3): qty 1

## 2013-06-23 MED ORDER — QUETIAPINE FUMARATE 50 MG PO TABS
50.0000 mg | ORAL_TABLET | Freq: Every day | ORAL | Status: DC
  Filled 2013-06-23: qty 1

## 2013-06-23 MED ORDER — CHLORDIAZEPOXIDE HCL 25 MG PO CAPS: 25.0000 mg | ORAL_CAPSULE | Freq: Every day | ORAL | Status: DC

## 2013-06-23 MED ORDER — LEVOTHYROXINE SODIUM 150 MCG PO TABS
150.0000 ug | ORAL_TABLET | Freq: Every day | ORAL | Status: DC
  Administered 2013-06-23: 150 ug via ORAL
  Filled 2013-06-23 (×3): qty 1

## 2013-06-23 MED ORDER — QUETIAPINE FUMARATE 50 MG PO TABS
50.0000 mg | ORAL_TABLET | Freq: Every day | ORAL | Status: DC
Start: 2013-06-23 — End: 2015-04-22

## 2013-06-23 MED ORDER — CHLORDIAZEPOXIDE HCL 25 MG PO CAPS
25.0000 mg | ORAL_CAPSULE | Freq: Four times a day (QID) | ORAL | Status: DC
  Administered 2013-06-23 (×3): 25 mg via ORAL
  Filled 2013-06-23 (×3): qty 1

## 2013-06-23 MED ORDER — CHLORDIAZEPOXIDE HCL 25 MG PO CAPS
25.0000 mg | ORAL_CAPSULE | Freq: Four times a day (QID) | ORAL | Status: DC | PRN
Start: 2013-06-23 — End: 2013-06-23

## 2013-06-23 MED ORDER — HYDROXYZINE HCL 25 MG PO TABS: 25.0000 mg | ORAL_TABLET | Freq: Four times a day (QID) | ORAL | Status: DC | PRN

## 2013-06-23 MED ORDER — ONDANSETRON HCL 4 MG PO TABS
ORAL_TABLET | ORAL | Status: AC
  Filled 2013-06-23: qty 1

## 2013-06-23 MED ORDER — CHLORDIAZEPOXIDE HCL 25 MG PO CAPS
50.0000 mg | ORAL_CAPSULE | Freq: Once | ORAL | Status: AC
  Administered 2013-06-23: 50 mg via ORAL
  Filled 2013-06-23: qty 2

## 2013-06-23 MED ORDER — CHLORDIAZEPOXIDE HCL 25 MG PO CAPS: 25.0000 mg | ORAL_CAPSULE | ORAL | Status: DC

## 2013-06-23 MED ORDER — THIAMINE HCL 100 MG/ML IJ SOLN
100.0000 mg | Freq: Once | INTRAMUSCULAR | Status: AC
  Administered 2013-06-23: 100 mg via INTRAMUSCULAR
  Filled 2013-06-23: qty 2

## 2013-06-23 MED ORDER — LOPERAMIDE HCL 2 MG PO CAPS: 2.0000 mg | ORAL_CAPSULE | ORAL | Status: DC | PRN

## 2013-06-23 MED ORDER — VITAMIN B-1 100 MG PO TABS
100.0000 mg | ORAL_TABLET | Freq: Every day | ORAL | Status: DC
  Filled 2013-06-23: qty 1

## 2013-06-23 MED ORDER — ONDANSETRON 4 MG PO TBDP
4.0000 mg | ORAL_TABLET | Freq: Once | ORAL | Status: AC
  Administered 2013-06-23: 4 mg via ORAL
  Filled 2013-06-23: qty 1

## 2013-06-23 NOTE — Progress Notes (Signed)
[place] INPATIENT:  Family/Significant Other Suicide Prevention Education  Suicide Prevention Education:  Patient Refusal for Family/Significant Other Suicide Prevention Education: The patient Brandon Mullen has refused to provide written consent for family/significant other to be provided Family/Significant Other Suicide Prevention Education during admission and/or prior to discharge.  Physician notified.  Debbrah Alar 06/23/2013, 5:30 PM

## 2013-06-23 NOTE — Progress Notes (Signed)
The following detox facilities have been called regarding inptx:  [facility]- per Estill Bamberg currently do not have any beds and could possibly have beds after 2p but is unsure  [facility]- per Elmyra Ricks no male beds available at this time but may be later this afternoon  [facility]- per Jan have chairs available, stated that referral could be faxed for review.  Referral faxed   [facility] Disposition MHT

## 2013-06-23 NOTE — Progress Notes (Signed)
Per Brandon Mullen at [facility] referral has been reviewed and states he is appropriate for their facility but did ask that pt be informed that there were only recliners available at this time and he would then transition into a room once others were transitioned out. This Probation officer spoke with pt and gave updated information on the facility and what to expect once he arrived.  Pt stated he was happy a facility has been located and did not mind transitioning from recliner to room.  Pt's RN aware of updated information and present during interaction with pt.   Brandon Mullen Disposition MHT

## 2013-06-23 NOTE — Progress Notes (Signed)
Observation Admission Assessment   06/23/2013 10:35 AM Brandon Mullen  MRN:  [id number] Subjective:  Patient states "I need to get in a 30 day program. No I'm not suicidal or homicidal.  I don't want to hurt myself or anyone else.  I just haven't been able to quit drinking. I had been sober for 3 years and this time I don't know what caused me to start drinking again.  Yes I was detox earlier this month and I stayed sober for 3 days after discharge.  I went to [facility] for intake and I am suppose to go back again for medications.  I haven't been taking my Wellbutrin cause it make me feel cloudy and I work with a lot of tools so I don't want to take it cause I need to keep my mind clear.  The Seroquel is mostly to help me sleep.   Diagnosis:   DSM5: Schizophrenia Disorders:  None noted Obsessive-Compulsive Disorders:  None noted Trauma-Stressor Disorders:  None noted Substance/Addictive Disorders:  Alcohol Intoxication (303.00) and Alcohol Withdrawal (291.81) Depressive Disorders:  Depressive Disorder NOS Total Time spent with patient: 30 minutes  Axis I: Alcohol Abuse and Substance Induced Mood Disorder Axis II: Deferred Axis III:  Past Medical History  Diagnosis Date  . Alcohol abuse   . Hernia, umbilical   . Hypothyroidism   . Hypertension    Axis IV: other psychosocial or environmental problems Axis V: 61-70 mild symptoms  ADL's:  Intact  Sleep: Good  Appetite:  Good  Suicidal Ideation:  Denies Homicidal Ideation:  Denies AEB (as evidenced by):  Psychiatric Specialty Exam: Physical Exam  Psychiatric: His speech is normal and behavior is normal. Thought content is not paranoid. Cognition and memory are normal. He exhibits a depressed mood. He expresses no homicidal and no suicidal ideation.    Review of Systems  Constitutional: Negative for malaise/fatigue and diaphoresis.  Gastrointestinal: Negative for nausea and vomiting.  Musculoskeletal: Negative.    Psychiatric/Behavioral: Positive for depression and substance abuse. Negative for suicidal ideas and hallucinations. The patient has insomnia (Seroquel helps sleep.  Slept well last night). The patient is not nervous/anxious.     Blood pressure 144/88, pulse 98, temperature 97.9 F (36.6 C), temperature source Oral, resp. rate 18, height _0  (1.727 m), weight 105.235 kg (232 lb).Body mass index is 35.28 kg/(m^2).  General Appearance: Casual  Eye Contact::  Good  Speech:  Clear and Coherent and Normal Rate  Volume:  Normal  Mood:  Depressed  Affect:  Congruent  Thought Process:  Circumstantial and Goal Directed  Orientation:  Full (Time, Place, and Person)  Thought Content:  "I want to get into a 30 day program"  Suicidal Thoughts:  No  Homicidal Thoughts:  No  Memory:  Immediate;   Good Recent;   Good Remote;   Good  Judgement:  Fair  Insight:  Present  Psychomotor Activity:  Tremor  Concentration:  Fair  Recall:  Good  Fund of Knowledge:Good  Language: Good  Akathisia:  No  Handed:  Right  AIMS (if indicated):     Assets:  Communication Skills Desire for Improvement Housing Social Support  Sleep:      Musculoskeletal: Strength & Muscle Tone: within normal limits Gait & Station: normal Patient leans: N/A  Current Medications:  Medication reviewed.  No changes made Current Facility-Administered Medications  Medication Dose Route Frequency Provider Last Rate Last Dose  . chlordiazePOXIDE (LIBRIUM) capsule 25 mg  25 mg Oral Q6H PRN  Lurena Nida, NP      . chlordiazePOXIDE (LIBRIUM) capsule 25 mg  25 mg Oral QID Lurena Nida, NP   25 mg at 06/23/13 0708   Followed by  . [START ON 06/24/2013] chlordiazePOXIDE (LIBRIUM) capsule 25 mg  25 mg Oral TID Lurena Nida, NP       Followed by  . [START ON 06/25/2013] chlordiazePOXIDE (LIBRIUM) capsule 25 mg  25 mg Oral BH-qamhs Lurena Nida, NP       Followed by  . [START ON 06/26/2013] chlordiazePOXIDE (LIBRIUM) capsule 25 mg   25 mg Oral Daily Lurena Nida, NP      . hydrOXYzine (ATARAX/VISTARIL) tablet 25 mg  25 mg Oral Q6H PRN Lurena Nida, NP      . levothyroxine (SYNTHROID, LEVOTHROID) tablet 150 mcg  150 mcg Oral QAC breakfast Lurena Nida, NP   150 mcg at 06/23/13 0645  . loperamide (IMODIUM) capsule 2-4 mg  2-4 mg Oral PRN Lurena Nida, NP      . multivitamin with minerals tablet 1 tablet  1 tablet Oral Daily Lurena Nida, NP   1 tablet at 06/23/13 0709  . ondansetron (ZOFRAN-ODT) disintegrating tablet 4 mg  4 mg Oral Q6H PRN Lurena Nida, NP      . QUEtiapine (SEROQUEL) tablet 50 mg  50 mg Oral QHS Lurena Nida, NP      . Derrill Memo ON 06/24/2013] thiamine (VITAMIN B-1) tablet 100 mg  100 mg Oral Daily Lurena Nida, NP        Lab Results: Labs reviewed no critical values noted Results for orders placed during the hospital encounter of 06/22/13 (from the past 48 hour(s))  ACETAMINOPHEN LEVEL     Status: None   Collection Time    06/22/13  8:20 PM      Result Value Ref Range   Acetaminophen (Tylenol), Serum <15.0  10 - 30 ug/mL   Comment:            THERAPEUTIC CONCENTRATIONS VARY     SIGNIFICANTLY. A RANGE OF 10-30     ug/mL MAY BE AN EFFECTIVE     CONCENTRATION FOR MANY PATIENTS.     HOWEVER, SOME ARE BEST TREATED     AT CONCENTRATIONS OUTSIDE THIS     RANGE.     ACETAMINOPHEN CONCENTRATIONS     >150 ug/mL AT 4 HOURS AFTER     INGESTION AND >50 ug/mL AT 12     HOURS AFTER INGESTION ARE     OFTEN ASSOCIATED WITH TOXIC     REACTIONS.  CBC     Status: Abnormal   Collection Time    06/22/13  8:20 PM      Result Value Ref Range   WBC 10.7 (*) 4.0 - 10.5 K/uL   RBC 5.31  4.22 - 5.81 MIL/uL   Hemoglobin 16.7  13.0 - 17.0 g/dL   HCT 48.1  39.0 - 52.0 %   MCV 90.6  78.0 - 100.0 fL   MCH 31.5  26.0 - 34.0 pg   MCHC 34.7  30.0 - 36.0 g/dL   RDW 16.3 (*) 11.5 - 15.5 %   Platelets 312  150 - 400 K/uL  COMPREHENSIVE METABOLIC PANEL     Status: None   Collection Time    06/22/13  8:20 PM       Result Value Ref Range   Sodium 140  137 - 147 mEq/L   Potassium 3.8  3.7 - 5.3 mEq/L   Chloride 102  96 - 112 mEq/L   CO2 23  19 - 32 mEq/L   Glucose, Bld 94  70 - 99 mg/dL   BUN 8  6 - 23 mg/dL   Creatinine, Ser 0.80  0.50 - 1.35 mg/dL   Calcium 8.8  8.4 - 10.5 mg/dL   Total Protein 7.1  6.0 - 8.3 g/dL   Albumin 4.3  3.5 - 5.2 g/dL   AST 31  0 - 37 U/L   ALT 29  0 - 53 U/L   Alkaline Phosphatase 71  39 - 117 U/L   Total Bilirubin 0.6  0.3 - 1.2 mg/dL   GFR calc non Af Amer >90  >90 mL/min   GFR calc Af Amer >90  >90 mL/min   Comment: (NOTE)     The eGFR has been calculated using the CKD EPI equation.     This calculation has not been validated in all clinical situations.     eGFR's persistently <90 mL/min signify possible Chronic Kidney     Disease.  ETHANOL     Status: Abnormal   Collection Time    06/22/13  8:20 PM      Result Value Ref Range   Alcohol, Ethyl (B) 225 (*) 0 - 11 mg/dL   Comment:            LOWEST DETECTABLE LIMIT FOR     SERUM ALCOHOL IS 11 mg/dL     FOR MEDICAL PURPOSES ONLY  SALICYLATE LEVEL     Status: Abnormal   Collection Time    06/22/13  8:20 PM      Result Value Ref Range   Salicylate Lvl <6.5 (*) 2.8 - 20.0 mg/dL  URINE RAPID DRUG SCREEN (HOSP PERFORMED)     Status: Abnormal   Collection Time    06/22/13  9:10 PM      Result Value Ref Range   Opiates NONE DETECTED  NONE DETECTED   Cocaine NONE DETECTED  NONE DETECTED   Benzodiazepines POSITIVE (*) NONE DETECTED   Amphetamines NONE DETECTED  NONE DETECTED   Tetrahydrocannabinol NONE DETECTED  NONE DETECTED   Barbiturates NONE DETECTED  NONE DETECTED   Comment:            DRUG SCREEN FOR MEDICAL PURPOSES     ONLY.  IF CONFIRMATION IS NEEDED     FOR ANY PURPOSE, NOTIFY LAB     WITHIN 5 DAYS.                LOWEST DETECTABLE LIMITS     FOR URINE DRUG SCREEN     Drug Class       Cutoff (ng/mL)     Amphetamine      1000     Barbiturate      200     Benzodiazepine   784     Tricyclics        696     Opiates          300     Cocaine          300     THC              50    Physical Findings: AIMS: Facial and Oral Movements Muscles of Facial Expression: None, normal Lips and Perioral Area: None, normal Jaw: None, normal Tongue: None, normal,Extremity Movements Upper (arms, wrists, hands, fingers): None, normal Lower (legs, knees, ankles, toes): None, normal,  Trunk Movements Neck, shoulders, hips: None, normal, Overall Severity Severity of abnormal movements (highest score from questions above): None, normal Incapacitation due to abnormal movements: None, normal Patient's awareness of abnormal movements (rate only patient's report): No Awareness, Dental Status Current problems with teeth and/or dentures?: No Does patient usually wear dentures?: No  CIWA:  CIWA-Ar Total: 6 COWS:  COWS Total Score: 5  Treatment Plan Summary: Referral to detox and rehab facilities.  Plan:  Medical Decision Making Problem Points:  Established problem, stable/improving (1), Review of last therapy session (1) and Review of psycho-social stressors (1) Data Points:  Review or order clinical lab tests (1) Review of medication regiment & side effects (2) Review of new medications or change in dosage (2)  Admission Assessment     Nursing information obtained from:  Patient Demographic factors:  Male;Caucasian Current Mental Status:  NA Loss Factors:  Legal issues;Financial problems / change in socioeconomic status Historical Factors:  NA Risk Reduction Factors:  Employed;Living with another person, especially a relative;Positive social support Total Time spent with patient: 15 minutes  CLINICAL FACTORS:   Alcohol/Substance Abuse/Dependencies  Psychiatric Specialty Exam: Same as above  Musculoskeletal: Same as above  COGNITIVE FEATURES THAT CONTRIBUTE TO RISK:  None Noted    SUICIDE RISK:   Minimal: No identifiable suicidal ideation.  Patients presenting with no risk factors  but with morbid ruminations; may be classified as minimal risk based on the severity of the depressive symptoms  PLAN OF CARE:  Will refer patient out to [facility] and [facility].  If no beds available patient will be discharged to continue seeking inpatient rehab services.   I certify that inpatient services furnished can reasonably be expected to improve the patient's condition.   Shuvon Rankin, FNP-BC 06/23/2013, 10:35 AM

## 2013-06-23 NOTE — [facility] Suicide Risk Assessment (Cosign Needed)
Suicide Risk Assessment  Discharge Assessment     Demographic Factors:  Male, Caucasian and Low socioeconomic status  Total Time spent with patient: 15 minutes  Psychiatric Specialty Exam:     Blood pressure 157/100, pulse 83, temperature 98.3 F (36.8 C), temperature source Oral, resp. rate 18, height 5\' 8"  (1.727 m), weight 105.235 kg (232 lb).Body mass index is 35.28 kg/(m^2).  General Appearance: Disheveled  Eye Contact::  Good  Speech:  Clear and Coherent  Volume:  Normal  Mood:  Anxious  Affect:  Appropriate  Thought Process:  Coherent  Orientation:  Full (Time, Place, and Person)  Thought Content:  Rumination  Suicidal Thoughts:  No  Homicidal Thoughts:  No  Memory:  Immediate;   Good Recent;   Good Remote;   Good  Judgement:  Fair  Insight:  Fair  Psychomotor Activity:  Increased  Concentration:  Good  Recall:  Good  Fund of Knowledge:Good  Language: Good  Akathisia:  NA  Handed:  Right  AIMS (if indicated):     Assets:  Communication Skills Desire for Improvement Resilience  Sleep:       Musculoskeletal: Strength & Muscle Tone: within normal limits Gait & Station: normal Patient leans: N/A   Mental Status Per Nursing Assessment::   On Admission:  NA  Current Mental Status by Physician: Denies SI, HI, AVH, contracts for safety.   Loss Factors: Financial problems/change in socioeconomic status  Historical Factors: Family history of mental illness or substance abuse and Impulsivity  Risk Reduction Factors:   Positive coping skills or problem solving skills  Continued Clinical Symptoms:  Depression:   Anhedonia Comorbid alcohol abuse/dependence Impulsivity Alcohol/Substance Abuse/Dependencies More than one psychiatric diagnosis Previous Psychiatric Diagnoses and Treatments Medical Diagnoses and Treatments/Surgeries  Cognitive Features That Contribute To Risk:  Polarized thinking    Suicide Risk:  Minimal: No identifiable suicidal  ideation.  Patients presenting with no risk factors but with morbid ruminations; may be classified as minimal risk based on the severity of the depressive symptoms  Discharge Diagnoses:   AXIS I:  Alcohol Abuse, Generalized Anxiety Disorder, Major Depression, Recurrent severe and Substance Induced Mood Disorder AXIS II:  Deferred AXIS III:   Past Medical History  Diagnosis Date  . Alcohol abuse   . Hernia, umbilical   . Hypothyroidism   . Hypertension    AXIS IV:  other psychosocial or environmental problems and problems related to social environment AXIS V:  51-60 moderate symptoms  Plan Of Care/Follow-up recommendations:  Activity:  As tolerated. Diet:  Heart healthy with low sodium.  Is patient on multiple antipsychotic therapies at discharge:  No   Has Patient had three or more failed trials of antipsychotic monotherapy by history:  No  Recommended Plan for Multiple Antipsychotic Therapies: NA  Elyse Jarvis Davielle Lingelbach, FNP-BC 06/23/2013, 6:10 PM

## 2013-06-23 NOTE — Progress Notes (Signed)
Patient ID: Brandon Mullen, male   DOB: 02-09-1976, 38 y.o.   MRN: [id number] Discharge Assessment  Patient has been resting.  Patient is feeling better.  Patient has been referred to [facility] other inpatient treatment facilities for substance/alcohol related problem.  Will be able to report if beds are available after 2:00 PM today.  Will hold patient to see if beds are available. If no beds available it has been discussed with patient that he can continue to look for rehab services from home and understanding voiced.  Patient will be given resource information to rehab facilities.      Disposition:  Discharge home with resources  Discharge Assessment     Demographic Factors:  Male and Caucasian  Total Time spent with patient: 20 minutes  Psychiatric Specialty Exam:     Blood pressure 158/101, pulse 84, temperature 97.6 F (36.4 C), temperature source Oral, resp. rate 18, height 5\' 8"  (1.727 m), weight 105.235 kg (232 lb).Body mass index is 35.28 kg/(m^2).  General Appearance: Casual  Eye Contact::  Good  Speech:  Clear and Coherent and Normal Rate  Volume:  Normal  Mood:  Depressed  Affect:  Congruent  Thought Process:  Circumstantial, Coherent and Goal Directed  Orientation:  Full (Time, Place, and Person)  Thought Content:  WDL  Suicidal Thoughts:  No  Homicidal Thoughts:  No  Memory:  Immediate;   Good Recent;   Good Remote;   Good  Judgement:  Fair  Insight:  Present  Psychomotor Activity:  Normal  Concentration:  Fair  Recall:  Good  Fund of Knowledge:Good  Language: Good  Akathisia:  No  Handed:  Right  AIMS (if indicated):     Assets:  Communication Skills Desire for Improvement Housing Social Support  Sleep:       Musculoskeletal: Strength & Muscle Tone: within normal limits Gait & Station: normal Patient leans: N/A   Mental Status Per Nursing Assessment::   On Admission:  NA  Current Mental Status by Physician: Patient denies suicidal/homicidal  ideation, psychosis, and paranoia  Loss Factors: NA  Historical Factors: NA  Risk Reduction Factors:   Sense of responsibility to family, Living with another person, especially a relative and Positive social support  Continued Clinical Symptoms:  Alcohol/Substance Abuse/Dependencies  Cognitive Features That Contribute To Risk:  None noted  Suicide Risk:  Minimal: No identifiable suicidal ideation.  Patients presenting with no risk factors but with morbid ruminations; may be classified as minimal risk based on the severity of the depressive symptoms  Discharge Diagnoses:   AXIS I:  Alcohol Abuse and Substance Induced Mood Disorder AXIS II:  Deferred AXIS III:   Past Medical History  Diagnosis Date  . Alcohol abuse   . Hernia, umbilical   . Hypothyroidism   . Hypertension    AXIS IV:  other psychosocial or environmental problems AXIS V:  61-70 mild symptoms  Plan Of Care/Follow-up recommendations:  Activity:  Resume usual activity Diet:  Resume usual diet  Is patient on multiple antipsychotic therapies at discharge:  No   Has Patient had three or more failed trials of antipsychotic monotherapy by history:  No  Recommended Plan for Multiple Antipsychotic Therapies: NA    Kenneshia Rehm, FNP-BC 06/23/2013, 1:52 PM

## 2013-06-23 NOTE — ED Provider Notes (Signed)
Medical screening examination/treatment/procedure(s) were performed by non-physician practitioner and as supervising physician I was immediately available for consultation/collaboration.   EKG Interpretation None       Merryl Hacker, MD 06/23/13 2129

## 2013-06-23 NOTE — Progress Notes (Signed)
[place] INPATIENT:  Family/Significant Other Suicide Prevention Education  Suicide Prevention Education:  Patient Refusal for Family/Significant Other Suicide Prevention Education: The patient Brandon Mullen has refused to provide written consent for family/significant other to be provided Family/Significant Other Suicide Prevention Education during admission and/or prior to discharge.  Physician notified.  Chieko Neises 06/23/2013, 7:04 AM

## 2013-06-23 NOTE — ED Notes (Signed)
Report given to Katharine Look, RN; pt has been accepted to 23 hour observation unit.

## 2013-06-23 NOTE — Progress Notes (Signed)
Patient discharged to [facility] in [place] for Inpatient Treatment.Placed call to [facility] to advise of patient's arrival time. Belongings returned. Transportation provided by [employer].

## 2013-06-23 NOTE — Progress Notes (Signed)
Pt admitted to Observation Unit and requesting alcohol detox. Pt awake but intoxicated and drowsy. Nausea and vomited during admission process. Pt more aware and admitted to taking valium off the streets for the last 3 days "to help me relax". Pt also now states he is compliant with his synthroid but not with his HTN med due to frequent urination. -SI/HI, -A/V Szc[place], contracts for safety. Will continue to monitor for evaluation and stabilization.

## 2013-06-23 NOTE — H&P (Signed)
[facility] Face-to-Face Psychiatry Consult   Observation Unit Admission Note  Referring Physician:  EDP [name] is an 38 y.o. male. Total Time spent with patient: 20 minutes  Assessment: AXIS I:  Alcohol Abuse AXIS II:  Deferred AXIS III:   Past Medical History  Diagnosis Date  . Alcohol abuse   . Hernia, umbilical   . Hypothyroidism   . Hypertension    AXIS IV:  other psychosocial or environmental problems AXIS V:  51-60 moderate symptoms  Plan:  No evidence of imminent risk to self or others at present.    Subjective:   Brandon Mullen is a 38 y.o. male patient who presented to [place] accompanied by his father requesting detox from alcohol.  Patient was discharged from [facility]) on 06/05/13. Patient states he maintained sobriety for two days after being discharged from [facility]. Since discharge, patient states he has been drinking one-fifth to two-fifths of liquor on a daily basis. Patient cannot identify any stressors that caused him to restart drinking.  Patient states after he was discharged, he went to [facility] "to get registered and take my prescriptions but I didn't go to any AA meetings."  At present, patient denies SI, HI or AVH.   Patient denies drug use, however, UDS is positive for benzodiazepines. He states the only medication he takes is Synthroid. Patient is prescribed Wellbutrin but does not take "because it makes me feel cloudy." He does not take his prescribed Lisinopril "because it gives me the poops." He states "I sometimes take Seroquel at night for sleep."  Reports sleeping 7-8 hours when he takes Seroquel.  He does not take Trazodone "because it makes me feel hung over."  Patient able to contract for safety. Patient reports "feeling shaky" and is having nausea and vomiting.   HPI:  38 y.o. Male requesting detox from alcohol. HPI Elements:   Location:  Alcohol Consumption/Mood. Quality:  Excessive Alcohol Intake. Severity:  Poorly  controlled; drinking 1/5 to 2/5 of liquor daily. Timing:  Drinks when feeling anxious. Duration:  Started drinking again 2 weeks ago. Context:  Anxiety.  Associated Signs/Symptoms: Depression Symptoms: Anhedonia Fatigue Difficulty concentrating Anxiety Loss of energy  Past Psychiatric History: Past Medical History  Diagnosis Date  . Alcohol abuse   . Hernia, umbilical   . Hypothyroidism   . Hypertension     reports that he has never smoked. His smokeless tobacco use includes Chew. He reports that he drinks alcohol. He reports that he uses illicit drugs. History reviewed. No pertinent family history.       Abuse/Neglect [facility]) Physical Abuse: Denies Verbal Abuse: Denies Sexual Abuse: Denies Allergies:  No Known Allergies  ACT Assessment Complete:  Yes:    Educational Status    Risk to Self: Risk to self Is patient at risk for suicide?: No Substance abuse history and/or treatment for substance abuse?: Yes  Risk to Others:    Abuse: Abuse/Neglect Assessment (Assessment to be complete while patient is alone) Physical Abuse: Denies Verbal Abuse: Denies Sexual Abuse: Denies Exploitation of patient/patient's resources: Denies Self-Neglect: Denies  Prior Inpatient Therapy:    Prior Outpatient Therapy:    Additional Information:     Objective: Blood pressure 130/89, pulse 104, temperature 98.1 F (36.7 C), temperature source Oral, resp. rate 20, height 5' 8" (1.727 m), weight 105.235 kg (232 lb).Body mass index is 35.28 kg/(m^2). Results for orders placed during the hospital encounter of 06/22/13 (from the past 72 hour(s))  ACETAMINOPHEN  LEVEL     Status: None   Collection Time    06/22/13  8:20 PM      Result Value Ref Range   Acetaminophen (Tylenol), Serum <15.0  10 - 30 ug/mL   Comment:            THERAPEUTIC CONCENTRATIONS VARY     SIGNIFICANTLY. A RANGE OF 10-30     ug/mL MAY BE AN EFFECTIVE     CONCENTRATION FOR MANY PATIENTS.     HOWEVER, SOME ARE BEST  TREATED     AT CONCENTRATIONS OUTSIDE THIS     RANGE.     ACETAMINOPHEN CONCENTRATIONS     >150 ug/mL AT 4 HOURS AFTER     INGESTION AND >50 ug/mL AT 12     HOURS AFTER INGESTION ARE     OFTEN ASSOCIATED WITH TOXIC     REACTIONS.  CBC     Status: Abnormal   Collection Time    06/22/13  8:20 PM      Result Value Ref Range   WBC 10.7 (*) 4.0 - 10.5 K/uL   RBC 5.31  4.22 - 5.81 MIL/uL   Hemoglobin 16.7  13.0 - 17.0 g/dL   HCT 48.1  39.0 - 52.0 %   MCV 90.6  78.0 - 100.0 fL   MCH 31.5  26.0 - 34.0 pg   MCHC 34.7  30.0 - 36.0 g/dL   RDW 16.3 (*) 11.5 - 15.5 %   Platelets 312  150 - 400 K/uL  COMPREHENSIVE METABOLIC PANEL     Status: None   Collection Time    06/22/13  8:20 PM      Result Value Ref Range   Sodium 140  137 - 147 mEq/L   Potassium 3.8  3.7 - 5.3 mEq/L   Chloride 102  96 - 112 mEq/L   CO2 23  19 - 32 mEq/L   Glucose, Bld 94  70 - 99 mg/dL   BUN 8  6 - 23 mg/dL   Creatinine, Ser 0.80  0.50 - 1.35 mg/dL   Calcium 8.8  8.4 - 10.5 mg/dL   Total Protein 7.1  6.0 - 8.3 g/dL   Albumin 4.3  3.5 - 5.2 g/dL   AST 31  0 - 37 U/L   ALT 29  0 - 53 U/L   Alkaline Phosphatase 71  39 - 117 U/L   Total Bilirubin 0.6  0.3 - 1.2 mg/dL   GFR calc non Af Amer >90  >90 mL/min   GFR calc Af Amer >90  >90 mL/min   Comment: (NOTE)     The eGFR has been calculated using the CKD EPI equation.     This calculation has not been validated in all clinical situations.     eGFR's persistently <90 mL/min signify possible Chronic Kidney     Disease.  ETHANOL     Status: Abnormal   Collection Time    06/22/13  8:20 PM      Result Value Ref Range   Alcohol, Ethyl (B) 225 (*) 0 - 11 mg/dL   Comment:            LOWEST DETECTABLE LIMIT FOR     SERUM ALCOHOL IS 11 mg/dL     FOR MEDICAL PURPOSES ONLY  SALICYLATE LEVEL     Status: Abnormal   Collection Time    06/22/13  8:20 PM      Result Value Ref Range   Salicylate Lvl <2.0 (*)   2.8 - 20.0 mg/dL  URINE RAPID DRUG SCREEN (HOSP PERFORMED)      Status: Abnormal   Collection Time    06/22/13  9:10 PM      Result Value Ref Range   Opiates NONE DETECTED  NONE DETECTED   Cocaine NONE DETECTED  NONE DETECTED   Benzodiazepines POSITIVE (*) NONE DETECTED   Amphetamines NONE DETECTED  NONE DETECTED   Tetrahydrocannabinol NONE DETECTED  NONE DETECTED   Barbiturates NONE DETECTED  NONE DETECTED   Comment:            DRUG SCREEN FOR MEDICAL PURPOSES     ONLY.  IF CONFIRMATION IS NEEDED     FOR ANY PURPOSE, NOTIFY LAB     WITHIN 5 DAYS.                LOWEST DETECTABLE LIMITS     FOR URINE DRUG SCREEN     Drug Class       Cutoff (ng/mL)     Amphetamine      1000     Barbiturate      200     Benzodiazepine   416     Tricyclics       606     Opiates          300     Cocaine          300     THC              50   Labs are reviewed and are pertinent for WBC 10.7, ETOH 225, and UDS positive for benzodiazepines.   Current Facility-Administered Medications  Medication Dose Route Frequency Provider Last Rate Last Dose  . chlordiazePOXIDE (LIBRIUM) capsule 25 mg  25 mg Oral Q6H PRN Lurena Nida, NP      . chlordiazePOXIDE (LIBRIUM) capsule 25 mg  25 mg Oral QID Lurena Nida, NP       Followed by  . [START ON 06/24/2013] chlordiazePOXIDE (LIBRIUM) capsule 25 mg  25 mg Oral TID Lurena Nida, NP       Followed by  . [START ON 06/25/2013] chlordiazePOXIDE (LIBRIUM) capsule 25 mg  25 mg Oral BH-qamhs Lurena Nida, NP       Followed by  . [START ON 06/26/2013] chlordiazePOXIDE (LIBRIUM) capsule 25 mg  25 mg Oral Daily Lurena Nida, NP      . chlordiazePOXIDE (LIBRIUM) capsule 50 mg  50 mg Oral Once Lurena Nida, NP      . hydrOXYzine (ATARAX/VISTARIL) tablet 25 mg  25 mg Oral Q6H PRN Lurena Nida, NP      . loperamide (IMODIUM) capsule 2-4 mg  2-4 mg Oral PRN Lurena Nida, NP      . multivitamin with minerals tablet 1 tablet  1 tablet Oral Daily Lurena Nida, NP      . ondansetron (ZOFRAN) 4 MG tablet           . ondansetron  (ZOFRAN-ODT) disintegrating tablet 4 mg  4 mg Oral Q6H PRN Lurena Nida, NP      . thiamine (B-1) injection 100 mg  100 mg Intramuscular Once Lurena Nida, NP      . Derrill Memo ON 06/24/2013] thiamine (VITAMIN B-1) tablet 100 mg  100 mg Oral Daily Lurena Nida, NP        Psychiatric Specialty Exam:     Blood pressure 130/89, pulse 104, temperature 98.1 F (36.7  C), temperature source Oral, resp. rate 20, height 5' 8" (1.727 m), weight 105.235 kg (232 lb).Body mass index is 35.28 kg/(m^2).  General Appearance: Fairly Groomed  Engineer, water::  Fair  Speech:  Slow  Volume:  Decreased  Mood:  Depressed  Affect:  Congruent  Thought Process:  Coherent  Orientation:  Full (Time, Place, and Person)  Thought Content:  Negative  Suicidal Thoughts:  No  Homicidal Thoughts:  No  Memory:  Immediate;   Fair Recent;   Fair Remote;   Fair  Judgement:  Fair  Insight:  Shallow  Psychomotor Activity:  Tremor  Concentration:  Fair  Recall:  Delta: Fair  Akathisia:  No  Handed:  Right  AIMS (if indicated):     Assets:  Desire for Improvement Housing Vocational/Educational  Sleep:      Musculoskeletal: Strength & Muscle Tone: within normal limits Gait & Station: normal Patient leans: N/A  Treatment Plan Summary: 1. Admit to Observation unit for stabilization. 2. Start on Librium detox protocol.  Serena Colonel, FNP-BC 06/23/2013 2:40 AM  Backup Physician: Darleene Cleaver Patient seen, evaluated and I agree with notes by Nurse Practitioner. Corena Pilgrim, MD

## 2013-06-23 NOTE — Discharge Instructions (Signed)
Alcohol and Nutrition °Nutrition serves two purposes. It provides energy. It also maintains body structure and function. Food supplies energy. It also provides the building blocks needed to replace worn or damaged cells. Alcoholics often eat poorly. This limits their supply of essential nutrients. This affects energy supply and structure maintenance. Alcohol also affects the body's nutrients in: °· Digestion. °· Storage. °· Using and getting rid of waste products. °IMPAIRMENT OF NUTRIENT DIGESTION AND UTILIZATION  °· Once ingested, food must be broken down into small components (digested). Then it is available for energy. It helps maintain body structure and function. Digestion begins in the mouth. It continues in the stomach and intestines, with help from the pancreas. The nutrients from digested food are absorbed from the intestines into the blood. Then they are carried to the liver. The liver prepares nutrients for: °· Immediate use. °· Storage and future use. °· Alcohol inhibits the breakdown of nutrients into usable molecules. °· It decreases secretion of digestive enzymes from the pancreas. °· Alcohol impairs nutrient absorption by damaging the cells lining the stomach and intestines. °· It also interferes with moving some nutrients into the blood. °· In addition, nutritional deficiencies themselves may lead to further absorption problems. °· For example, folate deficiency changes the cells that line the small intestine. This impairs how water is absorbed. It also affects absorbed nutrients. These include glucose, sodium, and additional folate. °· Even if nutrients are digested and absorbed, alcohol can prevent them from being fully used. It changes their transport, storage, and excretion. Impaired utilization of nutrients by alcoholics is indicated by: °· Decreased liver stores of vitamins, such as vitamin A. °· Increased excretion of nutrients such as fat. °ALCOHOL AND ENERGY SUPPLY  °· Three basic  nutritional components found in food are: °· Carbohydrates. °· Proteins. °· Fats. °· These are used as energy. Some alcoholics take in as much as 50% of their total daily calories from alcohol. They often neglect important foods. °· Even when enough food is eaten, alcohol can impair the ways the body controls blood sugar (glucose) levels. It may either increase or decrease blood sugar. °· In non-diabetic alcoholics, increased blood sugar (hyperglycemia) is caused by poor insulin secretion. It is usually temporary. °· Decreased blood sugar (hypoglycemia) can cause serious injury even if this condition is short-lived. Low blood sugar can happen when a fasting or malnourished person drinks alcohol. When there is no food to supply energy, stored sugar is used up. The products of alcohol inhibit forming glucose from other compounds such as amino acids. As a result, alcohol causes the brain and other body tissue to lack glucose. It is needed for energy and function. °· Alcohol is an energy source. But how the body processes and uses the energy from alcohol is complex. Also, when alcohol is substituted for carbohydrates, subjects tend to lose weight. This indicates that they get less energy from alcohol than from food. °ALCOHOL - MAINTAINING CELL STRUCTURE AND FUNCTION  °Structure °Cells are made mostly of protein. So an adequate protein diet is important for maintaining cell structure. This is especially true if cells are being damaged. Research indicates that alcohol affects protein nutrition by causing impaired: °· Digestion of proteins to amino acids. °· Processing of amino acids by the small intestine and liver. °· Synthesis of proteins from amino acids. °· Protein secretion by the liver. °Function °Nutrients are essential for the body to function well. They provide the tools that the body needs to work well:  °·   Proteins.  Vitamins.  Minerals. Alcohol can disrupt body function. It may cause nutrient  deficiencies. And it may interfere with the way nutrients are processed. Vitamins  Vitamins are essential to maintain growth and normal metabolism. They regulate many of the body`s processes. Chronic heavy drinking causes deficiencies in many vitamins. This is caused by eating less. And, in some cases, vitamins may be poorly absorbed. For example, alcohol inhibits fat absorption. It impairs how the vitamins A, E, and D are normally absorbed along with dietary fats. Not enough vitamin A may cause night blindness. Not enough vitamin D may cause softening of the bones.  Some alcoholics lack vitamins A, C, D, E, K, and the B vitamins. These are all involved in wound healing and cell maintenance. In particular, because vitamin K is necessary for blood clotting, lacking that vitamin can cause delayed clotting. The result is excess bleeding. Lacking other vitamins involved in brain function may cause severe neurological damage. Minerals Deficiencies of minerals such as calcium, magnesium, iron, and zinc are common in alcoholics. The alcohol itself does not seem to affect how these minerals are absorbed. Rather, they seem to occur secondary to other alcohol-related problems, such as:  Less calcium absorbed.  Not enough magnesium.  More urinary excretion.  Vomiting.  Diarrhea.  Not enough iron due to gastrointestinal bleeding.  Not enough zinc or losses related to other nutrient deficiencies.  Mineral deficiencies can cause a variety of medical consequences. These range from calcium-related bone disease to zinc-related night blindness and skin lesions. ALCOHOL, MALNUTRITION, AND MEDICAL COMPLICATIONS  Liver Disease   Alcoholic liver damage is caused primarily by alcohol itself. But poor nutrition may increase the risk of alcohol-related liver damage. For example, nutrients normally found in the liver are known to be affected by drinking alcohol. These include carotenoids, which are the major  sources of vitamin A, and vitamin E compounds. Decreases in such nutrients may play some role in alcohol-related liver damage. Pancreatitis  Research suggests that malnutrition may increase the risk of developing alcoholic pancreatitis. Research suggests that a diet lacking in protein may increase alcohol's damaging effect on the pancreas. Brain  Nutritional deficiencies may have severe effects on brain function. These may be permanent. Specifically, thiamine deficiencies are often seen in alcoholics. They can cause severe neurological problems. These include:  Impaired movement.  Memory loss seen in Wernicke-Korsakoff syndrome. Pregnancy  Alcohol has toxic effects on fetal development. It causes alcohol-related birth defects. They include fetal alcohol syndrome. Alcohol itself is toxic to the fetus. Also, the nutritional deficiency can affect how the fetus develops. That may compound the risk of developmental damage.  Nutritional needs during pregnancy are 10% to 30% greater than normal. Food intake can increase by as much as 140% to cover the needs of both mother and fetus. An alcoholic mother`s nutritional problems may adversely affect the nutrition of the fetus. And alcohol itself can also restrict nutrition flow to the fetus. NUTRITIONAL STATUS OF ALCOHOLICS  Techniques for assessing nutritional status include:  Taking body measurements to estimate fat reserves. They include:  Weight.  Height.  Mass.  Skin fold thickness.  Performing blood analysis to provide measurements of circulating:  Proteins.  Vitamins.  Minerals.  These techniques tend to be imprecise. For many nutrients, there is no clear "cut-off" point that would allow an accurate definition of deficiency. So assessing the nutritional status of alcoholics is limited by these techniques. Dietary status may provide information about the risk of developing nutritional problems.  Dietary status is assessed by:  Taking  patients' dietary histories.  Evaluating the amount and types of food they are eating.  It is difficult to determine what exact amount of alcohol begins to have damaging effects on nutrition. In general, moderate drinkers have 2 drinks or less per day. They seem to be at little risk for nutritional problems. Various medical disorders begin to appear at greater levels.  Research indicates that the majority of even the heaviest drinkers have few obvious nutritional deficiencies. Many alcoholics who are hospitalized for medical complications of their disease do have severe malnutrition. Alcoholics tend to eat poorly. Often they eat less than the amounts of food necessary to provide enough:  Carbohydrates.  Protein.  Fat.  Vitamins A and C.  B vitamins.  Minerals like calcium and iron. Of major concern is alcohol's effect on digesting food and use of nutrients. It may shift a mildly malnourished person toward severe malnutrition. Document Released: 12/10/2004 Document Revised: 05/10/2011 Document Reviewed: 05/26/2005 [facility] Patient Information 2014 [place].  Alcohol Use Disorder Alcohol use disorder is a mental disorder. It is not a one-time incident of heavy drinking. Alcohol use disorder is the excessive and uncontrollable use of alcohol over time that leads to problems with functioning in one or more areas of daily living. People with this disorder risk harming themselves and others when they drink to excess. Alcohol use disorder also can cause other mental disorders, such as mood and anxiety disorders, and serious physical problems. People with alcohol use disorder often misuse other drugs.  Alcohol use disorder is common and widespread. Some people with this disorder drink alcohol to cope with or escape from negative life events. Others drink to relieve chronic pain or symptoms of mental illness. People with a family history of alcohol use disorder are at higher risk of losing  control and using alcohol to excess.  SYMPTOMS  Signs and symptoms of alcohol use disorder may include the following:   Consumption ofalcohol inlarger amounts or over a longer period of time than intended.  Multiple unsuccessful attempts to cutdown or control alcohol use.   A great deal of time spent obtaining alcohol, using alcohol, or recovering from the effects of alcohol (hangover).  A strong desire or urge to use alcohol (cravings).   Continued use of alcohol despite problems at work, school, or home because of alcohol use.   Continued use of alcohol despite problems in relationships because of alcohol use.  Continued use of alcohol in situations when it is physically hazardous, such as driving a car.  Continued use of alcohol despite awareness of a physical or psychological problem that is likely related to alcohol use. Physical problems related to alcohol use can involve the brain, heart, liver, stomach, and intestines. Psychological problems related to alcohol use include intoxication, depression, anxiety, psychosis, delirium, and dementia.   The need for increased amounts of alcohol to achieve the same desired effect, or a decreased effect from the consumption of the same amount of alcohol (tolerance).  Withdrawal symptoms upon reducing or stopping alcohol use, or alcohol use to reduce or avoid withdrawal symptoms. Withdrawal symptoms include:  Racing heart.  Hand tremor.  Difficulty sleeping.  Nausea.  Vomiting.  Hallucinations.  Restlessness.  Seizures. DIAGNOSIS Alcohol use disorder is diagnosed through an assessment by your caregiver. Your caregiver may start by asking three or four questions to screen for excessive or problematic alcohol use. To confirm a diagnosis of alcohol use disorder, at least two symptoms (  see SYMPTOMS) must be present within a 32-month period. The severity of alcohol use disorder depends on the number of symptoms:  Mild two or  three.  Moderate four or five.  Severe six or more. Your caregiver may perform a physical exam or use results from lab tests to see if you have physical problems resulting from alcohol use. Your caregiver may refer you to a mental health professional for evaluation. TREATMENT  Some people with alcohol use disorder are able to reduce their alcohol use to low-risk levels. Some people with alcohol use disorder need to quit drinking alcohol. When necessary, mental health professionals with specialized training in substance use treatment can help. Your caregiver can help you decide how severe your alcohol use disorder is and what type of treatment you need. The following forms of treatment are available:   Detoxification. Detoxification involves the use of prescription medication to prevent alcohol withdrawal symptoms in the first week after quitting. This is important for people with a history of symptoms of withdrawal and for heavy drinkers who are likely to have withdrawal symptoms. Alcohol withdrawal can be dangerous and, in severe cases, cause death. Detoxification is usually provided in a hospital or in-patient substance use treatment facility.  Counseling or talk therapy. Talk therapy is provided by substance use treatment counselors. It addresses the reasons people use alcohol and ways to keep them from drinking again. The goals of talk therapy are to help people with alcohol use disorder find healthy activities and ways to cope with life stress, to identify and avoid triggers for alcohol use, and to handle cravings, which can cause relapse.  Medication.Different medications can help treat alcohol use disorder through the following actions:  Decrease alcohol cravings.  Decrease the positive reward response felt from alcohol use.  Produce an uncomfortable physical reaction when alcohol is used (aversion therapy).  Support groups. Support groups are run by people who have quit drinking. They  provide emotional support, advice, and guidance. These forms of treatment are often combined. Some people with alcohol use disorder benefit from intensive combination treatment provided by specialized substance use treatment centers. Both inpatient and outpatient treatment programs are available. Document Released: 03/25/2004 Document Revised: 10/18/2012 Document Reviewed: 05/25/2012 [facility] Patient Information 2014 [place].  Alcohol Problems Most adults who drink alcohol drink in moderation (not a lot) are at low risk for developing problems related to their drinking. However, all drinkers, including low-risk drinkers, should know about the health risks connected with drinking alcohol. RECOMMENDATIONS FOR LOW-RISK DRINKING  Drink in moderation. Moderate drinking is defined as follows:   Men - no more than 2 drinks per day.  Nonpregnant women - no more than 1 drink per day.  Over age 58 - no more than 1 drink per day. A standard drink is 12 grams of pure alcohol, which is equal to a 12 ounce bottle of beer or wine cooler, a 5 ounce glass of wine, or 1.5 ounces of distilled spirits (such as whiskey, brandy, vodka, or rum).  ABSTAIN FROM (DO NOT DRINK) ALCOHOL:  When pregnant or considering pregnancy.  When taking a medication that interacts with alcohol.  If you are alcohol dependent.  A medical condition that prohibits drinking alcohol (such as ulcer, liver disease, or heart disease). DISCUSS WITH YOUR CAREGIVER:  If you are at risk for coronary heart disease, discuss the potential benefits and risks of alcohol use: Light to moderate drinking is associated with lower rates of coronary heart disease in certain populations (  for example, men over age 55 and postmenopausal women). Infrequent or nondrinkers are advised not to begin light to moderate drinking to reduce the risk of coronary heart disease so as to avoid creating an alcohol-related problem. Similar protective effects can  likely be gained through proper diet and exercise.  Women and the elderly have smaller amounts of body water than men. As a result women and the elderly achieve a higher blood alcohol concentration after drinking the same amount of alcohol.  Exposing a fetus to alcohol can cause a broad range of birth defects referred to as Fetal Alcohol Syndrome (FAS) or Alcohol-Related Birth Defects (ARBD). Although FAS/ARBD is connected with excessive alcohol consumption during pregnancy, studies also have reported neurobehavioral problems in infants born to mothers reporting drinking an average of 1 drink per day during pregnancy.  Heavier drinking (the consumption of more than 4 drinks per occasion by men and more than 3 drinks per occasion by women) impairs learning (cognitive) and psychomotor functions and increases the risk of alcohol-related problems, including accidents and injuries. CAGE QUESTIONS:   Have you ever felt that you should Cut down on your drinking?  Have people Annoyed you by criticizing your drinking?  Have you ever felt bad or Guilty about your drinking?  Have you ever had a drink first thing in the morning to steady your nerves or get rid of a hangover (Eye opener)? If you answered positively to any of these questions: You may be at risk for alcohol-related problems if alcohol consumption is:   Men: Greater than 14 drinks per week or more than 4 drinks per occasion.  Women: Greater than 7 drinks per week or more than 3 drinks per occasion. Do you or your family have a medical history of alcohol-related problems, such as:  Blackouts.  Sexual dysfunction.  Depression.  Trauma.  Liver dysfunction.  Sleep disorders.  Hypertension.  Chronic abdominal pain.  Has your drinking ever caused you problems, such as problems with your family, problems with your work (or school) performance, or accidents/injuries?  Do you have a compulsion to drink or a preoccupation with  drinking?  Do you have poor control or are you unable to stop drinking once you have started?  Do you have to drink to avoid withdrawal symptoms?  Do you have problems with withdrawal such as tremors, nausea, sweats, or mood disturbances?  Does it take more alcohol than in the past to get you high?  Do you feel a strong urge to drink?  Do you change your plans so that you can have a drink?  Do you ever drink in the morning to relieve the shakes or a hangover? If you have answered a number of the previous questions positively, it may be time for you to talk to your caregivers, family, and friends and see if they think you have a problem. Alcoholism is a chemical dependency that keeps getting worse and will eventually destroy your health and relationships. Many alcoholics end up dead, impoverished, or in prison. This is often the end result of all chemical dependency.  Do not be discouraged if you are not ready to take action immediately.  Decisions to change behavior often involve up and down desires to change and feeling like you cannot decide.  Try to think more seriously about your drinking behavior.  Think of the reasons to quit. WHERE TO GO FOR ADDITIONAL INFORMATION   The [place] on Alcohol Abuse and Alcoholism ([place]) http://www.bradshaw.com/  [employer] on  Alcoholism and Drug Dependence (NCADD) www.ncadd.[place] ([place]) http://carpenter.net/  Document Released: 02/15/2005 Document Revised: 05/10/2011 Document Reviewed: 10/04/2007 [facility] Patient Information 2014 [place].  Alcohol Intoxication Alcohol intoxication occurs when the amount of alcohol that a person has consumed impairs his or her ability to mentally and physically function. Alcohol directly impairs the normal chemical activity of the brain. Drinking large amounts of alcohol can lead to changes in mental function and behavior, and it can cause many physical effects  that can be harmful.  Alcohol intoxication can range in severity from mild to very severe. Various factors can affect the level of intoxication that occurs, such as the person's age, gender, weight, frequency of alcohol consumption, and the presence of other medical conditions (such as diabetes, seizures, or heart conditions). Dangerous levels of alcohol intoxication may occur when people drink large amounts of alcohol in a short period (binge drinking). Alcohol can also be especially dangerous when combined with certain prescription medicines or "recreational" drugs. SIGNS AND SYMPTOMS Some common signs and symptoms of mild alcohol intoxication include:  Loss of coordination.  Changes in mood and behavior.  Impaired judgment.  Slurred speech. As alcohol intoxication progresses to more severe levels, other signs and symptoms will appear. These may include:  Vomiting.  Confusion and impaired memory.  Slowed breathing.  Seizures.  Loss of consciousness. DIAGNOSIS  Your health care provider will take a medical history and perform a physical exam. You will be asked about the amount and type of alcohol you have consumed. Blood tests will be done to measure the concentration of alcohol in your blood. In many places, your blood alcohol level must be lower than 80 mg/dL (0.08%) to legally drive. However, many dangerous effects of alcohol can occur at much lower levels.  TREATMENT  People with alcohol intoxication often do not require treatment. Most of the effects of alcohol intoxication are temporary, and they go away as the alcohol naturally leaves the body. Your health care provider will monitor your condition until you are stable enough to go home. Fluids are sometimes given through an IV access tube to help prevent dehydration.  HOME CARE INSTRUCTIONS  Do not drive after drinking alcohol.  Stay hydrated. Drink enough water and fluids to keep your urine clear or pale yellow. Avoid  caffeine.   Only take over-the-counter or prescription medicines as directed by your health care provider.  SEEK MEDICAL CARE IF:   You have persistent vomiting.   You do not feel better after a few days.  You have frequent alcohol intoxication. Your health care provider can help determine if you should see a substance use treatment counselor. SEEK IMMEDIATE MEDICAL CARE IF:   You become shaky or tremble when you try to stop drinking.   You shake uncontrollably (seizure).   You throw up (vomit) blood. This may be bright red or may look like black coffee grounds.   You have blood in your stool. This may be bright red or may appear as a black, tarry, bad smelling stool.   You become lightheaded or faint.  MAKE SURE YOU:   Understand these instructions.  Will watch your condition.  Will get help right away if you are not doing well or get worse. Document Released: 11/25/2004 Document Revised: 10/18/2012 Document Reviewed: 07/21/2012 [facility] Patient Information 2014 [place].  Alcohol Withdrawal Anytime drug use is interfering with normal living activities it has become abuse. This includes problems with family and friends. Psychological  dependence has developed when your mind tells you that the drug is needed. This is usually followed by physical dependence when a continuing increase of drugs are required to get the same feeling or "high." This is known as addiction or chemical dependency. A person's risk is much higher if there is a history of chemical dependency in the family. Mild Withdrawal Following Stopping Alcohol, When Addiction or Chemical Dependency Has Developed When a person has developed tolerance to alcohol, any sudden stopping of alcohol can cause uncomfortable physical symptoms. Most of the time these are mild and consist of tremors in the hands and increases in heart rate, breathing, and temperature. Sometimes these symptoms are associated with anxiety,  panic attacks, and bad dreams. There may also be stomach upset. Normal sleep patterns are often interrupted with periods of inability to sleep (insomnia). This may last for 6 months. Because of this discomfort, many people choose to continue drinking to get rid of this discomfort and to try to feel normal. Severe Withdrawal with Decreased or No Alcohol Intake, When Addiction or Chemical Dependency Has Developed About five percent of alcoholics will develop signs of severe withdrawal when they stop using alcohol. One sign of this is development of generalized seizures (convulsions). Other signs of this are severe agitation and confusion. This may be associated with believing in things which are not real or seeing things which are not really there (delusions and hallucinations). Vitamin deficiencies are usually present if alcohol intake has been long-term. Treatment for this most often requires hospitalization and close observation. Addiction can only be helped by stopping use of all chemicals. This is hard but may save your life. With continual alcohol use, possible outcomes are usually loss of self respect and esteem, violence, and death. Addiction cannot be cured but it can be stopped. This often requires outside help and the care of professionals. Treatment centers are listed in the yellow pages under Cocaine, Narcotics, and Alcoholics Anonymous. Most hospitals and clinics can refer you to a specialized care center. It is not necessary for you to go through the uncomfortable symptoms of withdrawal. Your caregiver can provide you with medicines that will help you through this difficult period. Try to avoid situations, friends, or drugs that made it possible for you to keep using alcohol in the past. Learn how to say no. It takes a long period of time to overcome addictions to all drugs, including alcohol. There may be many times when you feel as though you want a drink. After getting rid of the physical  addiction and withdrawal, you will have a lessening of the craving which tells you that you need alcohol to feel normal. Call your caregiver if more support is needed. Learn who to talk to in your family and among your friends so that during these periods you can receive outside help. Alcoholics Anonymous (AA) has helped many people over the years. To get further help, contact AA or call your caregiver, counselor, or clergyperson. Al-Anon and Alateen are support groups for friends and family members of an alcoholic. The people who love and care for an alcoholic often need help, too. For information about these organizations, check your phone directory or call a local alcoholism treatment center.  SEEK IMMEDIATE MEDICAL CARE IF:   You have a seizure.  You have a fever.  You experience uncontrolled vomiting or you vomit up blood. This may be bright red or look like black coffee grounds.  You have blood in the stool. This  may be bright red or appear as a black, tarry, bad-smelling stool.  You become lightheaded or faint. Do not drive if you feel this way. Have someone else drive you or call [id number] for help.  You become more agitated or confused.  You develop uncontrolled anxiety.  You begin to see things that are not really there (hallucinate). Your caregiver has determined that you completely understand your medical condition, and that your mental state is back to normal. You understand that you have been treated for alcohol withdrawal, have agreed not to drink any alcohol for a minimum of 1 day, will not operate a car or other machinery for 24 hours, and have had an opportunity to ask any questions about your condition. Document Released: 11/25/2004 Document Revised: 05/10/2011 Document Reviewed: 10/04/2007 [facility] Patient Information 2014 [place].

## 2013-06-23 NOTE — Progress Notes (Signed)
Patient educated on Suicide Prevention Education. Patient voiced understanding. Patient also verbalized that increased consumtpion of Alcohol places him at a great risk for SI.

## 2013-07-03 NOTE — Progress Notes (Signed)
Case discussed, agree with plan 

## 2014-04-14 ENCOUNTER — Emergency Department (HOSPITAL_COMMUNITY)
Admission: EM | Admit: 2014-04-14 | Discharge: 2014-04-14 | Disposition: A | Discharge status: Home / Self Care | Payer: Self-pay | Attending: Emergency Medicine | Admitting: Emergency Medicine

## 2014-04-14 ENCOUNTER — Encounter (HOSPITAL_COMMUNITY): Payer: Self-pay | Admitting: Emergency Medicine

## 2014-04-14 DIAGNOSIS — E039 Hypothyroidism, unspecified: Secondary | ICD-10-CM | POA: Insufficient documentation

## 2014-04-14 DIAGNOSIS — Z79899 Other long term (current) drug therapy: Secondary | ICD-10-CM | POA: Insufficient documentation

## 2014-04-14 DIAGNOSIS — I10 Essential (primary) hypertension: Secondary | ICD-10-CM | POA: Insufficient documentation

## 2014-04-14 DIAGNOSIS — K429 Umbilical hernia without obstruction or gangrene: Secondary | ICD-10-CM | POA: Insufficient documentation

## 2014-04-14 NOTE — ED Notes (Signed)
Pt c/o umbilical hernia ongoing for 1-2 years. Pt denies N/V. Last BM was today and normal. Pt has good appetite.

## 2014-04-14 NOTE — Discharge Instructions (Signed)
Please follow up with your primary care physician in 1-2 days. If you do not have one please call the [facility] number listed above. Please follow up with [facility] to schedule a follow up appointment.  Please remember lifting heavy objects at work may worsen your hernia or cause severe complications. Please read all discharge instructions and return precautions.    Hernia A hernia occurs when an internal organ pushes out through a weak spot in the abdominal wall. Hernias most commonly occur in the groin and around the navel. Hernias often can be pushed back into place (reduced). Most hernias tend to get worse over time. Some abdominal hernias can get stuck in the opening (irreducible or incarcerated hernia) and cannot be reduced. An irreducible abdominal hernia which is tightly squeezed into the opening is at risk for impaired blood supply (strangulated hernia). A strangulated hernia is a medical emergency. Because of the risk for an irreducible or strangulated hernia, surgery may be recommended to repair a hernia. CAUSES   Heavy lifting.  Prolonged coughing.  Straining to have a bowel movement.  A cut (incision) made during an abdominal surgery. HOME CARE INSTRUCTIONS   Bed rest is not required. You may continue your normal activities.  Avoid lifting more than 10 pounds (4.5 kg) or straining.  Cough gently. If you are a smoker it is best to stop. Even the best hernia repair can break down with the continual strain of coughing. Even if you do not have your hernia repaired, a cough will continue to aggravate the problem.  Do not wear anything tight over your hernia. Do not try to keep it in with an outside bandage or truss. These can damage abdominal contents if they are trapped within the hernia sac.  Eat a normal diet.  Avoid constipation. Straining over long periods of time will increase hernia size and encourage breakdown of repairs. If you cannot  do this with diet alone, stool softeners may be used. SEEK IMMEDIATE MEDICAL CARE IF:   You have a fever.  You develop increasing abdominal pain.  You feel nauseous or vomit.  Your hernia is stuck outside the abdomen, looks discolored, feels hard, or is tender.  You have any changes in your bowel habits or in the hernia that are unusual for you.  You have increased pain or swelling around the hernia.  You cannot push the hernia back in place by applying gentle pressure while lying down. MAKE SURE YOU:   Understand these instructions.  Will watch your condition.  Will get help right away if you are not doing well or get worse. Document Released: 02/15/2005 Document Revised: 05/10/2011 Document Reviewed: 10/05/2007 [facility] Patient Information 2015 [place], [place]. This information is not intended to replace advice given to you by your health care provider. Make sure you discuss any questions you have with your health care provider.

## 2014-04-14 NOTE — ED Notes (Signed)
Pt. Here for umbilical hernia, states he has had it for a couple years. Denies increased pain, able to manipulate and reduce it. Denies N/V/D. States "I'm just here because I was concerned about it and I need to be cleared for work. I have to be able to lift 70lbs".

## 2014-04-14 NOTE — ED Provider Notes (Signed)
CSN: [id number]     Arrival date & time 04/14/14  1201 History   First MD Initiated Contact with Patient 04/14/14 1341     Chief Complaint  Patient presents with  . Hernia     (Consider location/radiation/quality/duration/timing/severity/associated sxs/prior Treatment) HPI Comments: Patient is a 39 yo M PMHx significant for HTN, ETOH abuse, umbilical hernia presenting to the ED for clearance to go back to work at [employer]. 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 He denies any abdominal pain, nausea, vomiting, fever, chills, constipation, diarrhea, urinary symptoms. Last bowel movement was this morning and normal. States he has no pain with lifting or movement. He has not seen a Psychologist, sport and exercise for this. Abdominal surgical history includes splenectomy.    Past Medical History  Diagnosis Date  . Alcohol abuse   . Hernia, umbilical   . Hypothyroidism   . Hypertension    Past Surgical History  Procedure Laterality Date  . Splenectomy, total     No family history on file. History  Substance Use Topics  . Smoking status: Never Smoker   . Smokeless tobacco: Current User    Types: Chew  . Alcohol Use: No     Comment: Quit 2015    Review of Systems  All other systems reviewed and are negative.     Allergies  Review of patient's allergies indicates no known allergies.  Home Medications   Prior to Admission medications   Medication Sig Start Date End Date Taking? Authorizing Provider  levothyroxine (SYNTHROID, LEVOTHROID) 150 MCG tablet Take 1 tablet (150 mcg total) by mouth daily before breakfast. For low thyroid function 06/05/13   Encarnacion Slates, NP  lisinopril (PRINIVIL,ZESTRIL) 10 MG tablet Take 1 tablet (10 mg total) by mouth daily. For hypertension 06/05/13   Encarnacion Slates, NP  QUEtiapine (SEROQUEL) 50 MG tablet Take 1 tablet (50  mg total) by mouth at bedtime. 06/23/13   Shuvon Rankin, NP  traZODone (DESYREL) 50 MG tablet Take 1 tablet (50 mg total) by mouth at bedtime as needed for sleep. 06/05/13   Encarnacion Slates, NP   BP 135/90 mmHg  Pulse 76  Temp(Src) 98.2 F (36.8 C) (Oral)  Resp 18  Ht 5\' 8"  (1.727 m)  Wt 250 lb (113.399 kg)  BMI 38.02 kg/m2  SpO2 94% Physical Exam  Constitutional: He is oriented to person, place, and time. He appears well-developed and well-nourished. No distress.  HENT:  Head: Normocephalic and atraumatic.  Right Ear: External ear normal.  Left Ear: External ear normal.  Nose: Nose normal.  Mouth/Throat: Oropharynx is clear and moist. No oropharyngeal exudate.  Eyes: Conjunctivae are normal.  Neck: Normal range of motion. Neck supple.  Cardiovascular: Normal rate, regular rhythm and normal heart sounds.   Pulmonary/Chest: Effort normal and breath sounds normal. No respiratory distress.  Abdominal: Soft. Bowel sounds are normal. He exhibits no distension and no mass. There is no tenderness. There is no rigidity, no rebound, no guarding and no CVA tenderness. A hernia is present.    Musculoskeletal: Normal range of motion.  Neurological: He is alert and oriented to person, place, and time.  Skin: Skin is warm and dry. He is not diaphoretic.  Psychiatric: He has a normal mood and affect.  Nursing note and vitals reviewed.  ED Course  Procedures (including critical care time) Medications - No data to display  Labs Review Labs Reviewed - No data to display  Imaging Review No results found.   EKG Interpretation None      MDM   Final diagnoses:  Umbilical hernia without obstruction and without gangrene    Filed Vitals:   04/14/14 1421  BP:   Pulse:   Temp: 98.2 F (36.8 C)  Resp:    Afebrile, NAD, non-toxic appearing, AAOx4.  Patient with umbilical hernia, no evidence of incarceration and/or strangulation. Hernia is reducible. Discussed with patient that he needs  an outpatient surgery consultation. Also discussed that hernia may worsen with lifting heavy objects at work and lead to complications. Patient states he understands the risks, but wishes to return to work so he may get insurance to eventually have the hernia repaired.    Harlow Mares, PA-C 04/14/14 [place], MD 04/22/14 1410

## 2014-05-10 ENCOUNTER — Encounter (HOSPITAL_COMMUNITY): Payer: Self-pay | Admitting: Cardiology

## 2014-05-10 ENCOUNTER — Emergency Department (HOSPITAL_COMMUNITY): Payer: Self-pay

## 2014-05-10 ENCOUNTER — Emergency Department (HOSPITAL_COMMUNITY)
Admission: EM | Admit: 2014-05-10 | Discharge: 2014-05-10 | Disposition: A | Discharge status: Home / Self Care | Payer: Self-pay | Attending: Emergency Medicine | Admitting: Emergency Medicine

## 2014-05-10 DIAGNOSIS — Y999 Unspecified external cause status: Secondary | ICD-10-CM | POA: Insufficient documentation

## 2014-05-10 DIAGNOSIS — Z8719 Personal history of other diseases of the digestive system: Secondary | ICD-10-CM | POA: Insufficient documentation

## 2014-05-10 DIAGNOSIS — Y929 Unspecified place or not applicable: Secondary | ICD-10-CM | POA: Insufficient documentation

## 2014-05-10 DIAGNOSIS — E039 Hypothyroidism, unspecified: Secondary | ICD-10-CM | POA: Insufficient documentation

## 2014-05-10 DIAGNOSIS — Y939 Activity, unspecified: Secondary | ICD-10-CM | POA: Insufficient documentation

## 2014-05-10 DIAGNOSIS — M25572 Pain in left ankle and joints of left foot: Secondary | ICD-10-CM

## 2014-05-10 DIAGNOSIS — X58XXXA Exposure to other specified factors, initial encounter: Secondary | ICD-10-CM | POA: Insufficient documentation

## 2014-05-10 DIAGNOSIS — I10 Essential (primary) hypertension: Secondary | ICD-10-CM | POA: Insufficient documentation

## 2014-05-10 DIAGNOSIS — S93402A Sprain of unspecified ligament of left ankle, initial encounter: Secondary | ICD-10-CM | POA: Insufficient documentation

## 2014-05-10 DIAGNOSIS — R200 Anesthesia of skin: Secondary | ICD-10-CM | POA: Insufficient documentation

## 2014-05-10 DIAGNOSIS — Z79899 Other long term (current) drug therapy: Secondary | ICD-10-CM | POA: Insufficient documentation

## 2014-05-10 MED ORDER — NAPROXEN 500 MG PO TABS: 500.0000 mg | ORAL_TABLET | Freq: Two times a day (BID) | ORAL | Status: DC
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
## 2014-05-10 NOTE — Discharge Instructions (Signed)
Please call your doctor for a followup appointment within 24-48 hours. When you talk to your doctor please let them know that you were seen in the emergency department and have them acquire all of your records so that they can discuss the findings with you and formulate a treatment plan to fully care for your new and ongoing problems. Please follow-up with health and wellness Center Please follow-up with orthopedics Please rest, ice, elevate Please apply warm compressions and massage After work please rest, ice, elevate the ankle-toes above nose Please take medications as prescribed-please take on a full stomach. Please do not take any extra anti-inflammatory such as ibuprofen, Motrin while on this medication for this can stomach bleeds.  Please continue to monitor symptoms closely and if symptoms are to worsen or change (fever greater than 101, chills, sweating, nausea, vomiting, chest pain, shortness of breathe, difficulty breathing, weakness, numbness, tingling, worsening or changes to pain pattern, fall, injury, loss of sensation, redness, changes to skin color, warmth to the touch) please report back to the Emergency Department immediately.    Ankle Pain Ankle pain is a common symptom. The bones, cartilage, tendons, and muscles of the ankle joint perform a lot of work each day. The ankle joint holds your body weight and allows you to move around. Ankle pain can occur on either side or back of 1 or both ankles. Ankle pain may be sharp and burning or dull and aching. There may be tenderness, stiffness, redness, or warmth around the ankle. The pain occurs more often when a person walks or puts pressure on the ankle. CAUSES  There are many reasons ankle pain can develop. It is important to work with your caregiver to identify the cause since many conditions can impact the bones, cartilage, muscles, and tendons. Causes for ankle pain include:  Injury, including a break (fracture), sprain, or strain  often due to a fall, sports, or a high-impact activity.  Swelling (inflammation) of a tendon (tendonitis).  Achilles tendon rupture.  Ankle instability after repeated sprains and strains.  Poor foot alignment.  Pressure on a nerve (tarsal tunnel syndrome).  Arthritis in the ankle or the lining of the ankle.  Crystal formation in the ankle (gout or pseudogout). DIAGNOSIS  A diagnosis is based on your medical history, your symptoms, results of your physical exam, and results of diagnostic tests. Diagnostic tests may include X-ray exams or a computerized magnetic scan (magnetic resonance imaging, MRI). TREATMENT  Treatment will depend on the cause of your ankle pain and may include:  Keeping pressure off the ankle and limiting activities.  Using crutches or other walking support (a cane or brace).  Using rest, ice, compression, and elevation.  Participating in physical therapy or home exercises.  Wearing shoe inserts or special shoes.  Losing weight.  Taking medications to reduce pain or swelling or receiving an injection.  Undergoing surgery. HOME CARE INSTRUCTIONS   Only take over-the-counter or prescription medicines for pain, discomfort, or fever as directed by your caregiver.  Put ice on the injured area.  Put ice in a plastic bag.  Place a towel between your skin and the bag.  Leave the ice on for 15-20 minutes at a time, 03-04 times a day.  Keep your leg raised (elevated) when possible to lessen swelling.  Avoid activities that cause ankle pain.  Follow specific exercises as directed by your caregiver.  Record how often you have ankle pain, the location of the pain, and what it feels like.  This information may be helpful to you and your caregiver.  Ask your caregiver about returning to work or sports and whether you should drive.  Follow up with your caregiver for further examination, therapy, or testing as directed. SEEK MEDICAL CARE IF:   Pain or  swelling continues or worsens beyond 1 week.  You have an oral temperature above 102 F (38.9 C).  You are feeling unwell or have chills.  You are having an increasingly difficult time with walking.  You have loss of sensation or other new symptoms.  You have questions or concerns. MAKE SURE YOU:   Understand these instructions.  Will watch your condition.  Will get help right away if you are not doing well or get worse. Document Released: 08/05/2009 Document Revised: 05/10/2011 Document Reviewed: 08/05/2009 [facility] Patient Information 2015 [place], [place]. This information is not intended to replace advice given to you by your health care provider. Make sure you discuss any questions you have with your health care provider.   Emergency Department Resource Guide 1) Find a Doctor and Pay Out of Pocket Although you won't have to find out who is covered by your insurance plan, it is a good idea to ask around and get recommendations. You will then need to call the office and see if the doctor you have chosen will accept you as a new patient and what types of options they offer for patients who are self-pay. Some doctors offer discounts or will set up payment plans for their patients who do not have insurance, but you will need to ask so you aren't surprised when you get to your appointment.  2) Contact Your Local Health Department Not all health departments have doctors that can see patients for sick visits, but many do, so it is worth a call to see if yours does. If you don't know where your local health department is, you can check in your phone book. The CDC also has a tool to help you locate your state's health department, and many state websites also have listings of all of their local health departments.  3) Find a [facility] If your illness is not likely to be very severe or complicated, you may want to try a walk in clinic. These are popping up all over the country in  pharmacies, drugstores, and shopping centers. They're usually staffed by nurse practitioners or physician assistants that have been trained to treat common illnesses and complaints. They're usually fairly quick and inexpensive. However, if you have serious medical issues or chronic medical problems, these are probably not your best option.  No Primary Care Doctor: - Call Health Connect at  [phone] - they can help you locate a primary care doctor that  accepts your insurance, provides certain services, etc. - Physician Referral Service- [phone]  Chronic Pain Problems: Organization         Address  Phone   Notes  [facility]  [phone] Patients need to be referred by their primary care doctor.   Medication Assistance: Organization         Address  Phone   Notes  [facility] Medication [facility] [address] [place], [place] [address] [phone] --Must be a resident of [place] -- Must have NO insurance coverage whatsoever (no Medicaid/ Medicare, etc.) -- The pt. MUST have a primary care doctor that directs their care regularly and follows them in the community   MedAssist  [phone]  [employer]  [phone]    Agencies that provide inexpensive medical care: Organization         Address  Phone   Notes  [place]  [phone]   [facility]    [phone]   [facility] [place], [place] [address] [phone]   [place] [address], [place] [phone]   Planned Parenthood    [phone]   [facility]    [phone]   [place] and [place] [address], [place] Phone:  [phone], Fax:  [phone] Hours of Operation:  9 am - 6 pm, M-F.  Also accepts Medicaid/Medicare and self-pay.  [facility] for [place], [address],  [place] Phone: [phone], Fax: [phone]. Hours of Operation:  8:30 am - 5:30 pm, M-F.  Also accepts Medicaid and self-pay.  [facility] [place] [address] [place] Phone: [phone]   [place], [place], [place] [phone], Ext. [phone] Mondays & Thursdays: 7-9 AM.  First 15 patients are seen on a first come, first serve basis.    [facility] Providers:  Organization         Address  Phone   Notes  [facility] [address], [place] [phone] Also accepts self-pay patients.  [facility] [id number] [place], [place]  [phone]   [place], [address], [place] [phone]   [facility] [address] [place] [phone]   Lucianne Lei [address], [place]   [phone] Only accepts [place] Access [place] patients after they have their name applied to their card.   Self-Pay (no insurance) in [facility]:  Organization         Address  Phone   Notes  Sickle Cell Patients, [facility] [place] [phone]   [facility] [place] [phone]   [facility] [place]  [place], [place], [place] [phone]   [facility]/Dr. Osei-Bonsu  [address], [place] or [address], [place] [phone] Phone number for both [place] and [place] locations is the same.  Urgent Medical and [facility] [address], [place] [phone]   [facility] [address], [place] or [address] [phone]   [facility] [address], [place] [phone], phone; [phone], fax Sees patients 1st and 3rd Saturday of every month.  Must not  qualify for public or private insurance (i.e. Medicaid, Medicare, [place] Health Choice, Veterans' Benefits)  Household income should be no more than 200% of the poverty level The clinic cannot treat you if you are pregnant or think you are pregnant  Sexually transmitted diseases are not treated at the clinic.    Dental Care: Organization         Address  Phone  Notes  [facility] Department of [facility] [place] [phone] Accepts children up to age 44 who are enrolled in [place] or [place]; pregnant women with a Medicaid card;  and children who have applied for Medicaid or [place] Health Choice, but were declined, whose parents can pay a reduced fee at time of service.  [facility] Department of [facility]  [address], [place] [phone] Accepts children up to age 50 who are enrolled in [place] or [place]; pregnant women with a Medicaid card; and children who have applied for Medicaid or [place] Health Choice, but were declined, whose parents can pay a reduced fee at time of service.  [place] Adult Dental Access PROGRAM  [place] [phone] Patients are seen by appointment only. Walk-ins are not accepted. [place] will see patients 29 years of age and older. Monday - Tuesday (8am-5pm) Most Wednesdays (8:30-5pm) $30 per visit, cash only  [facility] Adult Dental Access PROGRAM  [address], [facility] [phone] Patients are seen by appointment only. Walk-ins are not accepted. [place] will see patients 45 years of age and older. One Wednesday Evening (Monthly: Volunteer Based).  $30 per visit, cash only  [place]  [phone] for adults; Children under age 62, call Graduate Pediatric Dentistry at [phone]. Children aged 39-14, please call [phone] to request a pediatric application.  Dental services are provided  in all areas of dental care including fillings, crowns and bridges, complete and partial dentures, implants, gum treatment, root canals, and extractions. Preventive care is also provided. Treatment is provided to both adults and children. Patients are selected via a lottery and there is often a waiting list.   [facility] [address], [place]  [phone] [email or web address]   [facility] [address] [place], [place] [phone], Ext. [phone] Second and Fourth Thursday of each month, opens at 6:30 AM; Clinic ends at 9 AM.  Patients are seen on a first-come first-served basis, and a limited number are seen during each clinic.   [facility]  [address] Hillard Danker [place], [place] [phone]   Eligibility Requirements You must have lived in [place], [place], or [place] counties for at least the last three months.   You cannot be eligible for state or federal sponsored [employer], including [employer], [place], or [employer].   You generally cannot be eligible for healthcare insurance through your employer.    How to apply: Eligibility screenings are held every Tuesday and Wednesday afternoon from 1:00 pm until 4:00 pm. You do not need an appointment for the interview!  [facility] [address] [place], [place]   [place]  [facility] Department  [place]  [phone]    Behavioral Health Resources in the Community: Intensive Outpatient Programs Organization         Address  Phone  Notes  [place]. [address], [place], [place] [phone]   [facility] Outpatient [address], [place], [place]   ADS: Alcohol & Drug Svcs [address] [place], [place]   [place] [address],  [place], [place] or [phone]   Substance Abuse Resources Organization         Address  Phone  Notes  Alcohol and Drug Services  [place]  [phone]   [facility] [place]   [facility]  [phone]  Residential & Outpatient Substance Abuse Program  [phone]   Psychological Services Organization         Address  Phone  Notes  [facility] [place]  [place]  [phone]   [place] [phone] N[address], [place] or [phone]    Mobile Crisis Teams Organization         Address  Phone  Notes  [facility], Mobile Crisis Care Unit  [phone]   Assertive Psychotherapeutic Services  [address] [place], [place]   [place] Levels [address], [place] [phone]    Self-Help/Support Groups Organization         Address  Phone             Notes  [place]. of [place] - variety of support groups  [place] Call for more information  Narcotics Anonymous (NA), [facility]aring [facility] [address], [employer] [place]  2 meetings at this location   Special educational needs teacher         Address  Phone  Notes  ASAP Residential Treatment [place],    [place]  [phone]   [employer] [place] [employer]  [address] [place] [phone], [place], [place]   [place], [place] [phone] Admissions: 8am-3pm M-F  Incentives Substance [place] [address],    [place], [place] [phone]   [facility] [address] [place], [place], [place]   [facility] [address],  [place], [place]   [facility] Programs - Intensive Outpatient [address], [place], [place]   [facility] ([place]  [place], [place] [phone] or  [phone]   [facility]idential Treatment Services (RTS) [address], [place], [place] Accepts Medicaid  [facility] [place] [address],  [place] [phone] Substance Abuse/Addiction Treatment   [facility] Organization         Address  Phone  Notes  [facility]  [phone]   Domenic Schwab, PhD [address] Arlis [place], [place]   [phone] or [phone]   [place], [place] [phone]   [facility] [address] [place], [place] [phone] Insurance/Medicaid/sponsorship through [facility] and Families [address], [address] [place]                                    [place], [place] [phone] [place] [address] [place], [place] [phone]    Dr. Adele Schilder  [phone]   [facility] Dept. 1) [address] [place] 2) [place] [facility] 3)  [place] [address], [place] [phone]  [phone]   [place] [phone] or [phone] (After Hours)

## 2014-05-10 NOTE — ED Notes (Signed)
Pt reports left ankle pain for a couple of weeks. Reports he has been unable to stand for periods of time because of the pain and its affecting his job.

## 2014-09-24 ENCOUNTER — Emergency Department (HOSPITAL_COMMUNITY)
Admission: EM | Admit: 2014-09-24 | Discharge: 2014-09-24 | Disposition: A | Discharge status: Home / Self Care | Payer: Self-pay | Attending: Emergency Medicine | Admitting: Emergency Medicine

## 2014-09-24 ENCOUNTER — Encounter (HOSPITAL_COMMUNITY): Payer: Self-pay | Admitting: Emergency Medicine

## 2014-09-24 DIAGNOSIS — Z791 Long term (current) use of non-steroidal anti-inflammatories (NSAID): Secondary | ICD-10-CM | POA: Insufficient documentation

## 2014-09-24 DIAGNOSIS — R42 Dizziness and giddiness: Secondary | ICD-10-CM | POA: Insufficient documentation

## 2014-09-24 DIAGNOSIS — I10 Essential (primary) hypertension: Secondary | ICD-10-CM | POA: Insufficient documentation

## 2014-09-24 DIAGNOSIS — E039 Hypothyroidism, unspecified: Secondary | ICD-10-CM | POA: Insufficient documentation

## 2014-09-24 DIAGNOSIS — Z8719 Personal history of other diseases of the digestive system: Secondary | ICD-10-CM | POA: Insufficient documentation

## 2014-09-24 DIAGNOSIS — Z79899 Other long term (current) drug therapy: Secondary | ICD-10-CM | POA: Insufficient documentation

## 2014-09-24 LAB — URINALYSIS, ROUTINE W REFLEX MICROSCOPIC
Bilirubin Urine: NEGATIVE
Glucose, UA: NEGATIVE mg/dL
Hgb urine dipstick: NEGATIVE
Ketones, ur: 15 mg/dL — AB
Leukocytes, UA: NEGATIVE
Nitrite: NEGATIVE
Protein, ur: NEGATIVE mg/dL
Specific Gravity, Urine: 1.031 — ABNORMAL HIGH (ref 1.005–1.030)
Urobilinogen, UA: 0.2 mg/dL (ref 0.0–1.0)
pH: 5.5 (ref 5.0–8.0)

## 2014-09-24 LAB — BASIC METABOLIC PANEL WITH GFR
Anion gap: 8 (ref 5–15)
BUN: 15 mg/dL (ref 6–20)
CO2: 30 mmol/L (ref 22–32)
Calcium: 9.6 mg/dL (ref 8.9–10.3)
Chloride: 100 mmol/L — ABNORMAL LOW (ref 101–111)
Creatinine, Ser: 0.94 mg/dL (ref 0.61–1.24)
GFR calc Af Amer: >60 mL/min (ref >60)
GFR calc non Af Amer: >60 mL/min (ref >60)
Glucose, Bld: 67 mg/dL (ref 65–99)
Potassium: 4.3 mmol/L (ref 3.5–5.1)
Sodium: 138 mmol/L (ref 135–145)

## 2014-09-24 LAB — CBC
HCT: 52.8 % — ABNORMAL HIGH (ref 39.0–52.0)
Hemoglobin: 18.2 g/dL — ABNORMAL HIGH (ref 13.0–17.0)
MCH: 32 pg (ref 26.0–34.0)
MCHC: 34.5 g/dL (ref 30.0–36.0)
MCV: 92.8 fL (ref 78.0–100.0)
Platelets: 322 K/uL (ref 150–400)
RBC: 5.69 MIL/uL (ref 4.22–5.81)
RDW: 15.5 % (ref 11.5–15.5)
WBC: 10.4 K/uL (ref 4.0–10.5)

## 2014-09-24 LAB — CBG MONITORING, ED: Glucose-Capillary: 109 mg/dL — ABNORMAL HIGH (ref 65–99)

## 2014-09-24 LAB — TSH: TSH: 7.012 u[IU]/mL — ABNORMAL HIGH (ref 0.350–4.500)

## 2014-09-24 MED ORDER — LEVOTHYROXINE SODIUM 150 MCG PO TABS: 150.0000 ug | ORAL_TABLET | Freq: Every day | ORAL | Status: DC

## 2014-09-24 NOTE — ED Provider Notes (Signed)
CSN: [id number]     Arrival date & time 09/24/14  1018 History   First MD Initiated Contact with Patient 09/24/14 1047     Chief Complaint  Patient presents with  . Dizziness  . out of med      (Consider location/radiation/quality/duration/timing/severity/associated sxs/prior Treatment) HPI Brandon Mullen is a 39 y.o. male history of hypothyroidism and hypertension comes in for evaluation of dizziness. Patient states for the past 2 weeks he has experienced episodes of dizziness that he characterizes as "body shutting down and just feeling weak". 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 He reports when he gets very hot he will experience these dizziness episodes that last approximately 2-3 minutes, pass on their own after rest and are not associated with chest pain, shortness of breath, vision changes, nausea or vomiting, unilateral numbness. He denies any discomfort now in the ED. He also reports being out of his levothyroxine and has not taken it for the past 2 weeks due to cost and financial constraints.  Past Medical History  Diagnosis Date  . Alcohol abuse   . Hernia, umbilical   . Hypothyroidism   . Hypertension    Past Surgical History  Procedure Laterality Date  . Splenectomy, total     No family history on file. History  Substance Use Topics  . Smoking status: Never Smoker   . Smokeless tobacco: Current User    Types: Chew  . Alcohol Use: No     Comment: Quit 2015 -- April 9th , 2015    Review of Systems A 10 point review of systems was completed and was negative except for pertinent positives and negatives as mentioned in the history of present illness     Allergies  Review of patient's allergies indicates no known allergies.  Home Medications   Prior to Admission medications   Medication Sig Start Date End Date Taking? Authorizing Provider  levothyroxine (SYNTHROID, LEVOTHROID) 150 MCG tablet Take 1 tablet (150 mcg total)  by mouth daily before breakfast. For low thyroid function 09/24/14   Comer Locket, PA-C  lisinopril (PRINIVIL,ZESTRIL) 10 MG tablet Take 1 tablet (10 mg total) by mouth daily. For hypertension 06/05/13   Encarnacion Slates, NP  naproxen (NAPROSYN) 500 MG tablet Take 1 tablet (500 mg total) by mouth 2 (two) times daily. 05/10/14   Marissa Sciacca, PA-C  QUEtiapine (SEROQUEL) 50 MG tablet Take 1 tablet (50 mg total) by mouth at bedtime. 06/23/13   Shuvon B Rankin, NP  traZODone (DESYREL) 50 MG tablet Take 1 tablet (50 mg total) by mouth at bedtime as needed for sleep. 06/05/13   Encarnacion Slates, NP   BP 112/58 mmHg  Pulse 62  Temp(Src) 99 F (37.2 C) (Oral)  Resp 19  Ht 5\' 7"  (1.702 m)  SpO2 94% Physical Exam  Constitutional: He is oriented to person, place, and time. He appears well-developed and well-nourished.  HENT:  Head: Normocephalic and atraumatic.  Mildly dry mucous membranes  Eyes: Conjunctivae are normal. Pupils are equal, round, and reactive to light. Right eye exhibits no discharge. Left eye exhibits no discharge. No scleral icterus.  Neck: Neck supple.  Cardiovascular: Normal rate, regular rhythm and normal heart sounds.   Pulmonary/Chest: Effort normal and breath sounds normal. No respiratory distress. He has no wheezes. He has no rales.  Abdominal: Soft. There is no tenderness.  Musculoskeletal: He exhibits no tenderness.  Neurological: He is alert and oriented to person, place, and  time.  Cranial Nerves II-XII grossly intact. Moves all extremities without ataxia. Motor and sensation 5/5. Gait is baseline  Skin: Skin is warm and dry. No rash noted.  Psychiatric: He has a normal mood and affect.  Nursing note and vitals reviewed.   ED Course  Procedures (including critical care time) Labs Review Labs Reviewed  BASIC METABOLIC PANEL - Abnormal; Notable for the following:    Chloride 100 (*)    All other components within normal limits  CBC - Abnormal; Notable for the  following:    Hemoglobin 18.2 (*)    HCT 52.8 (*)    All other components within normal limits  URINALYSIS, ROUTINE W REFLEX MICROSCOPIC (NOT AT [facility]) - Abnormal; Notable for the following:    Color, Urine AMBER (*)    Specific Gravity, Urine 1.031 (*)    Ketones, ur 15 (*)    All other components within normal limits  TSH - Abnormal; Notable for the following:    TSH 7.012 (*)    All other components within normal limits  CBG MONITORING, ED - Abnormal; Notable for the following:    Glucose-Capillary 109 (*)    All other components within normal limits    Imaging Review No results found.   EKG Interpretation   Date/Time:  Tuesday September 24 2014 11:07:44 EDT Ventricular Rate:  78 PR Interval:  127 QRS Duration: 86 QT Interval:  359 QTC Calculation: 409 R Axis:   85 Text Interpretation:  Pacemaker spikes or artifacts Sinus rhythm ST elev,  probable normal early repol pattern Confirmed by ZAVITZ  MD, JOSHUA ([id number])  on 09/24/2014 11:30:37 AM     Meds given in ED:  Medications - No data to display  New Prescriptions   No medications on file   Filed Vitals:   09/24/14 1024 09/24/14 1045 09/24/14 1115 09/24/14 1317  BP: 135/90 120/75 123/70 112/58  Pulse: 88 81 81 62  Temp: 99 F (37.2 C)     TempSrc: Oral     Resp: 16  21 19   Height: 5\' 7"  (1.702 m)     SpO2: 100% 92% 92% 94%    MDM  Vitals stable - WNL -afebrile Pt resting comfortably in ED. PE--physical exam as mentioned above and is not concerning. Evidence of some dehydration Labwork--evidence of dehydration on urinalysis and CBC with both elevated hemoglobin and specific gravity with ketones. TSH result is still elevated.  DDX--patient's symptoms likely secondary to dehydration and hot work environment. TSH ordered for PCP follow-up. Represcribed Synthroid. Given referral to [facility] and wellness Patient appears well, no apparent distress and is appropriate for discharge. I discussed all relevant lab  findings and imaging results with pt and they verbalized understanding. Discussed f/u with PCP within 48 hrs and return precautions, pt very amenable to plan. Prior to patient discharge, I discussed and reviewed this case with Dr.Zavitz   Final diagnoses:  Dizziness, nonspecific        Comer Locket, PA-C 09/24/14 [place]  Elnora Morrison, MD 09/24/14 [phone]

## 2014-09-24 NOTE — Progress Notes (Signed)
[place] [facility] for [facility] [phone]  Spoke to patient regarding primary care resources and the [facility] orange card. Orange card application provided and briefly explained. Resource guide and my contact information also given for any future questions or concerns. No other [place] Specialist needs identified at this time.

## 2014-09-24 NOTE — ED Notes (Signed)
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

## 2014-09-24 NOTE — Discharge Instructions (Signed)
You were evaluated in the ED today for your dizziness. There does not appear to be an emergent cause for her symptoms at this time. Your thyroid medication will be refilled. It is important for you to follow-up with primary care for definitive care. Return to ED for worsening symptoms.  Dizziness Dizziness is a common problem. It is a feeling of unsteadiness or light-headedness. You may feel like you are about to faint. Dizziness can lead to injury if you stumble or fall. A person of any age group can suffer from dizziness, but dizziness is more common in older adults. CAUSES  Dizziness can be caused by many different things, including:  Middle ear problems.  Standing for too long.  Infections.  An allergic reaction.  Aging.  An emotional response to something, such as the sight of blood.  Side effects of medicines.  Tiredness.  Problems with circulation or blood pressure.  Excessive use of alcohol or medicines, or illegal drug use.  Breathing too fast (hyperventilation).  An irregular heart rhythm (arrhythmia).  A low red blood cell count (anemia).  Pregnancy.  Vomiting, diarrhea, fever, or other illnesses that cause body fluid loss (dehydration).  Diseases or conditions such as Parkinson's disease, high blood pressure (hypertension), diabetes, and thyroid problems.  Exposure to extreme heat. DIAGNOSIS  Your health care provider will ask about your symptoms, perform a physical exam, and perform an electrocardiogram (ECG) to record the electrical activity of your heart. Your health care provider may also perform other heart or blood tests to determine the cause of your dizziness. These may include:  Transthoracic echocardiogram (TTE). During echocardiography, sound waves are used to evaluate how blood flows through your heart.  Transesophageal echocardiogram (TEE).  Cardiac monitoring. This allows your health care provider to monitor your heart rate and rhythm in real  time.  Holter monitor. This is a portable device that records your heartbeat and can help diagnose heart arrhythmias. It allows your health care provider to track your heart activity for several days if needed.  Stress tests by exercise or by giving medicine that makes the heart beat faster. TREATMENT  Treatment of dizziness depends on the cause of your symptoms and can vary greatly. HOME CARE INSTRUCTIONS   Drink enough fluids to keep your urine clear or pale yellow. This is especially important in very hot weather. In older adults, it is also important in cold weather.  Take your medicine exactly as directed if your dizziness is caused by medicines. When taking blood pressure medicines, it is especially important to get up slowly.  Rise slowly from chairs and steady yourself until you feel okay.  In the morning, first sit up on the side of the bed. When you feel okay, stand slowly while holding onto something until you know your balance is fine.  Move your legs often if you need to stand in one place for a long time. Tighten and relax your muscles in your legs while standing.  Have someone stay with you for 1-2 days if dizziness continues to be a problem. Do this until you feel you are well enough to stay alone. Have the person call your health care provider if he or she notices changes in you that are concerning.  Do not drive or use heavy machinery if you feel dizzy.  Do not drink alcohol. SEEK IMMEDIATE MEDICAL CARE IF:   Your dizziness or light-headedness gets worse.  You feel nauseous or vomit.  You have problems talking, walking, or using  your arms, hands, or legs.  You feel weak.  You are not thinking clearly or you have trouble forming sentences. It may take a friend or family member to notice this.  You have chest pain, abdominal pain, shortness of breath, or sweating.  Your vision changes.  You notice any bleeding.  You have side effects from medicine that seems  to be getting worse rather than better. MAKE SURE YOU:   Understand these instructions.  Will watch your condition.  Will get help right away if you are not doing well or get worse. Document Released: 08/11/2000 Document Revised: 02/20/2013 Document Reviewed: 09/04/2010 [facility] Patient Information 2015 [place], [place]. This information is not intended to replace advice given to you by your health care provider. Make sure you discuss any questions you have with your health care provider.

## 2015-04-22 ENCOUNTER — Ambulatory Visit (INDEPENDENT_AMBULATORY_CARE_PROVIDER_SITE_OTHER): Payer: BLUE CROSS/BLUE SHIELD | Admitting: Family Medicine

## 2015-04-22 VITALS — BP 120/84 | HR 81 | Temp 98.7°F | Resp 16 | Ht 69.0 in | Wt 202.0 lb

## 2015-04-22 DIAGNOSIS — M6248 Contracture of muscle, other site: Secondary | ICD-10-CM

## 2015-04-22 DIAGNOSIS — M62838 Other muscle spasm: Secondary | ICD-10-CM

## 2015-04-22 DIAGNOSIS — F1010 Alcohol abuse, uncomplicated: Secondary | ICD-10-CM | POA: Diagnosis not present

## 2015-04-22 DIAGNOSIS — M5412 Radiculopathy, cervical region: Secondary | ICD-10-CM

## 2015-04-22 DIAGNOSIS — F1011 Alcohol abuse, in remission: Secondary | ICD-10-CM

## 2015-04-22 MED ORDER — MELOXICAM 7.5 MG PO TABS: 7.5000 mg | ORAL_TABLET | Freq: Every day | ORAL | Status: DC

## 2015-04-22 MED ORDER — CYCLOBENZAPRINE HCL 5 MG PO TABS
ORAL_TABLET | ORAL | Status: DC
Start: 2015-04-22 — End: 2016-09-28

## 2015-04-22 NOTE — Patient Instructions (Signed)
You likely have a strained muscle or pinched nerve in the neck, which can lead to some muscle spasm as well. Try the mobic each morning (do not combine with other over the counter pain relievers), flexeril up to every 8 hours if needed.  Heat or ice to area as needed and see info below.  Out of work until Sunday night, but if you're unable to return to work Sunday night, or symptoms return next week, recheck. Return to the clinic or go to the nearest emergency room if any of your symptoms worsen or new symptoms occur.  Muscle Cramps and Spasms Muscle cramps and spasms occur when a muscle or muscles tighten and you have no control over this tightening (involuntary muscle contraction). They are a common problem and can develop in any muscle. The most common place is in the calf muscles of the leg. Both muscle cramps and muscle spasms are involuntary muscle contractions, but they also have differences:   Muscle cramps are sporadic and painful. They may last a few seconds to a quarter of an hour. Muscle cramps are often more forceful and last longer than muscle spasms.  Muscle spasms may or may not be painful. They may also last just a few seconds or much longer. CAUSES  It is uncommon for cramps or spasms to be due to a serious underlying problem. In many cases, the cause of cramps or spasms is unknown. Some common causes are:   Overexertion.   Overuse from repetitive motions (doing the same thing over and over).   Remaining in a certain position for a long period of time.   Improper preparation, form, or technique while performing a sport or activity.   Dehydration.   Injury.   Side effects of some medicines.   Abnormally low levels of the salts and ions in your blood (electrolytes), especially potassium and calcium. This could happen if you are taking water pills (diuretics) or you are pregnant.  Some underlying medical problems can make it more likely to develop cramps or spasms.  These include, but are not limited to:   Diabetes.   Parkinson disease.   Hormone disorders, such as thyroid problems.   Alcohol abuse.   Diseases specific to muscles, joints, and bones.   Blood vessel disease where not enough blood is getting to the muscles.  HOME CARE INSTRUCTIONS   Stay well hydrated. Drink enough water and fluids to keep your urine clear or pale yellow.  It may be helpful to massage, stretch, and relax the affected muscle.  For tight or tense muscles, use a warm towel, heating pad, or hot shower water directed to the affected area.  If you are sore or have pain after a cramp or spasm, applying ice to the affected area may relieve discomfort.  Put ice in a plastic bag.  Place a towel between your skin and the bag.  Leave the ice on for 15-20 minutes, 03-04 times a day.  Medicines used to treat a known cause of cramps or spasms may help reduce their frequency or severity. Only take over-the-counter or prescription medicines as directed by your caregiver. SEEK MEDICAL CARE IF:  Your cramps or spasms get more severe, more frequent, or do not improve over time.  MAKE SURE YOU:   Understand these instructions.  Will watch your condition.  Will get help right away if you are not doing well or get worse.   This information is not intended to replace advice given to you  by your health care provider. Make sure you discuss any questions you have with your health care provider.   Document Released: 08/07/2001 Document Revised: 06/12/2012 Document Reviewed: 02/02/2012 Elsevier Interactive Patient Education 2016 [employer]vier Inc[employer]   Cervical Radiculopathy Cervical radiculopathy happens when a nerve in the neck (cervical nerve) is pinched or bruised. This condition can develop because of an injury or as part of the normal aging process. Pressure on the cervical nerves can cause pain or numbness that runs from the neck all the way down into the arm and fingers.  Usually, this condition gets better with rest. Treatment may be needed if the condition does not improve.  CAUSES This condition may be caused by:  Injury.  Slipped (herniated) disk.  Muscle tightness in the neck because of overuse.  Arthritis.  Breakdown or degeneration in the bones and joints of the spine (spondylosis) due to aging.  Bone spurs that may develop near the cervical nerves. SYMPTOMS Symptoms of this condition include:  Pain that runs from the neck to the arm and hand. The pain can be severe or irritating. It may be worse when the neck is moved.  Numbness or weakness in the affected arm and hand. DIAGNOSIS This condition may be diagnosed based on symptoms, medical history, and a physical exam. You may also have tests, including:  X-rays.  CT scan.  MRI.  Electromyogram (EMG).  Nerve conduction tests. TREATMENT In many cases, treatment is not needed for this condition. With rest, the condition usually gets better over time. If treatment is needed, options may include:  Wearing a soft neck collar for short periods of time.  Physical therapy to strengthen your neck muscles.  Medicines, such as NSAIDs, oral corticosteroids, or spinal injections.  Surgery. This may be needed if other treatments do not help. Various types of surgery may be done depending on the cause of your problems. HOME CARE INSTRUCTIONS Managing Pain  Take over-the-counter and prescription medicines only as told by your health care provider.  If directed, apply ice to the affected area.  Put ice in a plastic bag.  Place a towel between your skin and the bag.  Leave the ice on for 20 minutes, 2-3 times per day.  If ice does not help, you can try using heat. Take a warm shower or warm bath, or use a heat pack as told by your health care provider.  Try a gentle neck and shoulder massage to help relieve symptoms. Activity  Rest as needed. Follow instructions from your health care  provider about any restrictions on activities.  Do stretching and strengthening exercises as told by your health care provider or physical therapist. General Instructions  If you were given a soft collar, wear it as told by your health care provider.  Use a flat pillow when you sleep.  Keep all follow-up visits as told by your health care provider. This is important. SEEK MEDICAL CARE IF:  Your condition does not improve with treatment. SEEK IMMEDIATE MEDICAL CARE IF:  Your pain gets much worse and cannot be controlled with medicines.  You have weakness or numbness in your hand, arm, face, or leg.  You have a high fever.  You have a stiff, rigid neck.  You lose control of your bowels or your bladder (have incontinence).  You have trouble with walking, balance, or speaking.   This information is not intended to replace advice given to you by your health care provider. Make sure you discuss any questions  you have with your health care provider.   Document Released: 11/10/2000 Document Revised: 11/06/2014 Document Reviewed: 04/11/2014 Elsevier Interactive Patient Education [employer].

## 2015-04-22 NOTE — Progress Notes (Signed)
Subjective:  By signing my name below, I, Rawaa Al Rifaie, attest that this documentation has been prepared under the direction and in the presence of Merri Ray, MD.  Leandra Kern, Medical Scribe. 04/22/2015.  12:37 PM.   Patient ID: Brandon Mullen, male    DOB: [birth year], 40 y.o.   MRN: [id number]  Chief Complaint  Patient presents with  . Back Pain    middle, x 2 weeks  . Shoulder Pain    left, x 2 weeks    HPI HPI Comments: Brandon Mullen is a 40 y.o. male who presents to [facility] complaining of shoulder pain.   Pt reports that the pain is sharp in nature and is mostly present in the area between he shoulder blades. He indicates that the pain radiates to the left side of the neck, upper back, and to the left upper arm area at times. Pt reports associated tenderness in the left hand with secondary weakness. Pt states that being in a supine position exacerbate the pain. He notes having a similar episode when he first started his job, and indicates that being off work for few days helped his symptoms. He took about 4 dosages of Ibuprofen at a time every 2-3 hours, however due to not finding much relief with this, he stopped taking the medication that frequently shortly after. Pt denies traumatic injuries to the areas, or performing any new activities, he also denies tarry stool, weakness in the lower extremities, or tingling sensation.   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 He has been working at [employer] for about a year.    Patient Active Problem List   Diagnosis Date Noted  . Alcohol abuse 06/23/2013  . Unspecified hypothyroidism 06/04/2013  . Moderate alcohol use disorder ([place]) 06/02/2013  . MDD (major depressive disorder), recurrent episode ([place]) 06/02/2013  . Anxiety state, unspecified 06/02/2013   Past Medical History  Diagnosis Date  . Alcohol abuse   . Hernia, umbilical   . Hypothyroidism   .  Hypertension    Past Surgical History  Procedure Laterality Date  . Splenectomy, total    . Splinectomy     No Known Allergies Prior to Admission medications   Medication Sig Start Date End Date Taking? Authorizing Provider  levothyroxine (SYNTHROID, LEVOTHROID) 150 MCG tablet Take 1 tablet (150 mcg total) by mouth daily before breakfast. For low thyroid function 09/24/14  Yes Comer Locket, PA-C   Social History   Social History  . Marital Status: Single    Spouse Name: N/A  . Number of Children: N/A  . Years of Education: N/A   Occupational History  . Not on file.   Social History Main Topics  . Smoking status: Never Smoker   . Smokeless tobacco: Current User    Types: Chew  . Alcohol Use: No     Comment: Quit 2015 -- April 9th , 2015  . Drug Use: No     Comment: opiates.Marland KitchenMarland KitchenQuit 2015  . Sexual Activity: Not on file   Other Topics Concern  . Not on file   Social History Narrative    Review of Systems  Musculoskeletal: Positive for myalgias, back pain and neck pain.  Neurological: Negative for weakness.      Objective:   Physical Exam  Constitutional: He is oriented to person, place, and time. He appears well-developed and well-nourished. No distress.  HENT:  Head: Normocephalic  and atraumatic.  Eyes: EOM are normal. Pupils are equal, round, and reactive to light.  Neck: Neck supple.  Cardiovascular: Normal rate.   Pulmonary/Chest: Effort normal.  Musculoskeletal: He exhibits tenderness.  Slight tenderness an spasm in the paraspinals of the neck. No midline bony tenderness in the C and thoracic spines. Decreased extension, decreased left greater than right rotation. Equal latera flexion, but discomfort with left lateral flexion. Slight tender ness over the left rhomboid, left trapezius. Shoulder, AC nodes, and clavicle are non tender.   Neurological: He is alert and oriented to person, place, and time. No cranial nerve deficit.  Reflex Scores:      Tricep  reflexes are 2+ on the right side and 2+ on the left side.      Bicep reflexes are 2+ on the right side and 2+ on the left side.      Brachioradialis reflexes are 2+ on the right side and 2+ on the left side. strenght is equal in upper extremities BL.   Skin: Skin is warm and dry.  Psychiatric: He has a normal mood and affect. His behavior is normal.  Nursing note and vitals reviewed.   Filed Vitals:   04/22/15 1141  BP: 120/84  Pulse: 81  Temp: 98.7 F (37.1 C)  TempSrc: Oral  Resp: 16  Height: 5\' 9"  (1.753 m)  Weight: 202 lb (91.627 kg)  SpO2: 98%      Assessment & Plan:   Brandon Mullen is a 40 y.o. male Cervical radicular pain - Plan: meloxicam (MOBIC) 7.5 MG tablet  Alcohol abuse, in remission  Muscle spasms of neck - Plan: cyclobenzaprine (FLEXERIL) 5 MG tablet  Overuse vs cervical radiculopathy with secondary spasm.  Possible underlying degenerative disc disease. No known injury, no midline bony tenderness. Strength intact and reflexes equal. X-ray was deferred today.  Short-term trial of Flexeril and Mobic for spasm and inflammation, side effects discussed. Heat, ice, gentle range of motion as tolerated.  -Out of work until Sunday night, then if symptoms recur or if not improved, return for possible x-ray and other treatments. Sooner if worse, and RTC precautions.   Commended on his continued sobriety.   Meds ordered this encounter  Medications  . cyclobenzaprine (FLEXERIL) 5 MG tablet    Sig: 1 pill by mouth up to every 8 hours as needed. Start with one pill by mouth each bedtime as needed due to sedation    Dispense:  15 tablet    Refill:  0  . meloxicam (MOBIC) 7.5 MG tablet    Sig: Take 1 tablet (7.5 mg total) by mouth daily.    Dispense:  15 tablet    Refill:  0   Patient Instructions  You likely have a strained muscle or pinched nerve in the neck, which can lead to some muscle spasm as well. Try the mobic each morning (do not combine with other over the  counter pain relievers), flexeril up to every 8 hours if needed.  Heat or ice to area as needed and see info below.  Out of work until Sunday night, but if you're unable to return to work Sunday night, or symptoms return next week, recheck. Return to the clinic or go to the nearest emergency room if any of your symptoms worsen or new symptoms occur.  Muscle Cramps and Spasms Muscle cramps and spasms occur when a muscle or muscles tighten and you have no control over this tightening (involuntary muscle contraction). They are a common problem  and can develop in any muscle. The most common place is in the calf muscles of the leg. Both muscle cramps and muscle spasms are involuntary muscle contractions, but they also have differences:   Muscle cramps are sporadic and painful. They may last a few seconds to a quarter of an hour. Muscle cramps are often more forceful and last longer than muscle spasms.  Muscle spasms may or may not be painful. They may also last just a few seconds or much longer. CAUSES  It is uncommon for cramps or spasms to be due to a serious underlying problem. In many cases, the cause of cramps or spasms is unknown. Some common causes are:   Overexertion.   Overuse from repetitive motions (doing the same thing over and over).   Remaining in a certain position for a long period of time.   Improper preparation, form, or technique while performing a sport or activity.   Dehydration.   Injury.   Side effects of some medicines.   Abnormally low levels of the salts and ions in your blood (electrolytes), especially potassium and calcium. This could happen if you are taking water pills (diuretics) or you are pregnant.  Some underlying medical problems can make it more likely to develop cramps or spasms. These include, but are not limited to:   Diabetes.   Parkinson disease.   Hormone disorders, such as thyroid problems.   Alcohol abuse.   Diseases specific to  muscles, joints, and bones.   Blood vessel disease where not enough blood is getting to the muscles.  HOME CARE INSTRUCTIONS   Stay well hydrated. Drink enough water and fluids to keep your urine clear or pale yellow.  It may be helpful to massage, stretch, and relax the affected muscle.  For tight or tense muscles, use a warm towel, heating pad, or hot shower water directed to the affected area.  If you are sore or have pain after a cramp or spasm, applying ice to the affected area may relieve discomfort.  Put ice in a plastic bag.  Place a towel between your skin and the bag.  Leave the ice on for 15-20 minutes, 03-04 times a day.  Medicines used to treat a known cause of cramps or spasms may help reduce their frequency or severity. Only take over-the-counter or prescription medicines as directed by your caregiver. SEEK MEDICAL CARE IF:  Your cramps or spasms get more severe, more frequent, or do not improve over time.  MAKE SURE YOU:   Understand these instructions.  Will watch your condition.  Will get help right away if you are not doing well or get worse.   This information is not intended to replace advice given to you by your health care provider. Make sure you discuss any questions you have with your health care provider.   Document Released: 08/07/2001 Document Revised: 06/12/2012 Document Reviewed: 02/02/2012 Elsevier Interactive Patient Education 2016 Elsevier Inc.   Cervical Radiculopathy Cervical radiculopathy happens when a nerve in the neck (cervical nerve) is pinched or bruised. This condition can develop because of an injury or as part of the normal aging process. Pressure on the cervical nerves can cause pain or numbness that runs from the neck all the way down into the arm and fingers. Usually, this condition gets better with rest. Treatment may be needed if the condition does not improve.  CAUSES This condition may be caused by:  Injury.  Slipped  (herniated) disk.  Muscle tightness in the  neck because of overuse.  Arthritis.  Breakdown or degeneration in the bones and joints of the spine (spondylosis) due to aging.  Bone spurs that may develop near the cervical nerves. SYMPTOMS Symptoms of this condition include:  Pain that runs from the neck to the arm and hand. The pain can be severe or irritating. It may be worse when the neck is moved.  Numbness or weakness in the affected arm and hand. DIAGNOSIS This condition may be diagnosed based on symptoms, medical history, and a physical exam. You may also have tests, including:  X-rays.  CT scan.  MRI.  Electromyogram (EMG).  Nerve conduction tests. TREATMENT In many cases, treatment is not needed for this condition. With rest, the condition usually gets better over time. If treatment is needed, options may include:  Wearing a soft neck collar for short periods of time.  Physical therapy to strengthen your neck muscles.  Medicines, such as NSAIDs, oral corticosteroids, or spinal injections.  Surgery. This may be needed if other treatments do not help. Various types of surgery may be done depending on the cause of your problems. HOME CARE INSTRUCTIONS Managing Pain  Take over-the-counter and prescription medicines only as told by your health care provider.  If directed, apply ice to the affected area.  Put ice in a plastic bag.  Place a towel between your skin and the bag.  Leave the ice on for 20 minutes, 2-3 times per day.  If ice does not help, you can try using heat. Take a warm shower or warm bath, or use a heat pack as told by your health care provider.  Try a gentle neck and shoulder massage to help relieve symptoms. Activity  Rest as needed. Follow instructions from your health care provider about any restrictions on activities.  Do stretching and strengthening exercises as told by your health care provider or physical therapist. General  Instructions  If you were given a soft collar, wear it as told by your health care provider.  Use a flat pillow when you sleep.  Keep all follow-up visits as told by your health care provider. This is important. SEEK MEDICAL CARE IF:  Your condition does not improve with treatment. SEEK IMMEDIATE MEDICAL CARE IF:  Your pain gets much worse and cannot be controlled with medicines.  You have weakness or numbness in your hand, arm, face, or leg.  You have a high fever.  You have a stiff, rigid neck.  You lose control of your bowels or your bladder (have incontinence).  You have trouble with walking, balance, or speaking.   This information is not intended to replace advice given to you by your health care provider. Make sure you discuss any questions you have with your health care provider.   Document Released: 11/10/2000 Document Revised: 11/06/2014 Document Reviewed: 04/11/2014 Elsevier Interactive Patient Education [employer].     I personally performed the services described in this documentation, which was scribed in my presence. The recorded information has been reviewed and considered, and addended by me as needed.

## 2015-05-01 ENCOUNTER — Emergency Department (HOSPITAL_COMMUNITY)
Admission: EM | Admit: 2015-05-01 | Discharge: 2015-05-01 | Disposition: A | Discharge status: Home / Self Care | Payer: BLUE CROSS/BLUE SHIELD | Attending: Emergency Medicine | Admitting: Emergency Medicine

## 2015-05-01 ENCOUNTER — Encounter (HOSPITAL_COMMUNITY): Payer: Self-pay | Admitting: Emergency Medicine

## 2015-05-01 DIAGNOSIS — Z791 Long term (current) use of non-steroidal anti-inflammatories (NSAID): Secondary | ICD-10-CM | POA: Diagnosis not present

## 2015-05-01 DIAGNOSIS — G8929 Other chronic pain: Secondary | ICD-10-CM | POA: Insufficient documentation

## 2015-05-01 DIAGNOSIS — Z79899 Other long term (current) drug therapy: Secondary | ICD-10-CM | POA: Diagnosis not present

## 2015-05-01 DIAGNOSIS — M25512 Pain in left shoulder: Secondary | ICD-10-CM | POA: Diagnosis not present

## 2015-05-01 DIAGNOSIS — F10120 Alcohol abuse with intoxication, uncomplicated: Secondary | ICD-10-CM | POA: Insufficient documentation

## 2015-05-01 DIAGNOSIS — F10129 Alcohol abuse with intoxication, unspecified: Secondary | ICD-10-CM | POA: Diagnosis present

## 2015-05-01 DIAGNOSIS — I10 Essential (primary) hypertension: Secondary | ICD-10-CM | POA: Insufficient documentation

## 2015-05-01 DIAGNOSIS — Z8719 Personal history of other diseases of the digestive system: Secondary | ICD-10-CM | POA: Diagnosis not present

## 2015-05-01 DIAGNOSIS — E039 Hypothyroidism, unspecified: Secondary | ICD-10-CM | POA: Insufficient documentation

## 2015-05-01 DIAGNOSIS — F1010 Alcohol abuse, uncomplicated: Secondary | ICD-10-CM

## 2015-05-01 NOTE — ED Notes (Signed)
[facility] [phone]

## 2015-05-01 NOTE — ED Notes (Signed)
Pt is able to ambulate steadily in department. Pt to bathroom to get dressed. Gave pt meal and discharge paperwork.  Dr. Lenna Sciara saw pt and spoke with pt and agreed that pt was ok to being discharged to lobby

## 2015-05-01 NOTE — ED Notes (Signed)
MD at bedside. 

## 2015-05-01 NOTE — ED Provider Notes (Signed)
Nursing asked me to see patient briefly on way out. Patient is awake alert" coma score 15. Ambulates without difficulty, speech clear  Orlie Dakin, MD 05/01/15 1610

## 2015-05-01 NOTE — ED Notes (Signed)
Called contact information x 3 without answer. Pt states has no one else to call. Pt states has to go to homeless shelter.. Pt given meal and water

## 2015-05-01 NOTE — Discharge Instructions (Signed)
Alcohol Use Disorder °Alcohol use disorder is a mental disorder. It is not a one-time incident of heavy drinking. Alcohol use disorder is the excessive and uncontrollable use of alcohol over time that leads to problems with functioning in one or more areas of daily living. People with this disorder risk harming themselves and others when they drink to excess. Alcohol use disorder also can cause other mental disorders, such as mood and anxiety disorders, and serious physical problems. People with alcohol use disorder often misuse other drugs.  °Alcohol use disorder is common and widespread. Some people with this disorder drink alcohol to cope with or escape from negative life events. Others drink to relieve chronic pain or symptoms of mental illness. People with a family history of alcohol use disorder are at higher risk of losing control and using alcohol to excess.  °Drinking too much alcohol can cause injury, accidents, and health problems. One drink can be too much when you are: °· Working. °· Pregnant or breastfeeding. °· Taking medicines. Ask your doctor. °· Driving or planning to drive. °SYMPTOMS  °Signs and symptoms of alcohol use disorder may include the following:  °· Consumption of alcohol in larger amounts or over a longer period of time than intended. °· Multiple unsuccessful attempts to cut down or control alcohol use.   °· A great deal of time spent obtaining alcohol, using alcohol, or recovering from the effects of alcohol (hangover). °· A strong desire or urge to use alcohol (cravings).   °· Continued use of alcohol despite problems at work, school, or home because of alcohol use.   °· Continued use of alcohol despite problems in relationships because of alcohol use. °· Continued use of alcohol in situations when it is physically hazardous, such as driving a car. °· Continued use of alcohol despite awareness of a physical or psychological problem that is likely related to alcohol use. Physical  problems related to alcohol use can involve the brain, heart, liver, stomach, and intestines. Psychological problems related to alcohol use include intoxication, depression, anxiety, psychosis, delirium, and dementia.   °· The need for increased amounts of alcohol to achieve the same desired effect, or a decreased effect from the consumption of the same amount of alcohol (tolerance). °· Withdrawal symptoms upon reducing or stopping alcohol use, or alcohol use to reduce or avoid withdrawal symptoms. Withdrawal symptoms include: °¨ Racing heart. °¨ Hand tremor. °¨ Difficulty sleeping. °¨ Nausea. °¨ Vomiting. °¨ Hallucinations. °¨ Restlessness. °¨ Seizures. °DIAGNOSIS °Alcohol use disorder is diagnosed through an assessment by your health care provider. Your health care provider may start by asking three or four questions to screen for excessive or problematic alcohol use. To confirm a diagnosis of alcohol use disorder, at least two symptoms must be present within a 12-month period. The severity of alcohol use disorder depends on the number of symptoms: °· Mild--two or three. °· Moderate--four or five. °· Severe--six or more. °Your health care provider may perform a physical exam or use results from lab tests to see if you have physical problems resulting from alcohol use. Your health care provider may refer you to a mental health professional for evaluation. °TREATMENT  °Some people with alcohol use disorder are able to reduce their alcohol use to low-risk levels. Some people with alcohol use disorder need to quit drinking alcohol. When necessary, mental health professionals with specialized training in substance use treatment can help. Your health care provider can help you decide how severe your alcohol use disorder is and what type of treatment you need.   The following forms of treatment are available:   Detoxification. Detoxification involves the use of prescription medicines to prevent alcohol withdrawal  symptoms in the first week after quitting. This is important for people with a history of symptoms of withdrawal and for heavy drinkers who are likely to have withdrawal symptoms. Alcohol withdrawal can be dangerous and, in severe cases, cause death. Detoxification is usually provided in a hospital or in-patient substance use treatment facility.  Counseling or talk therapy. Talk therapy is provided by substance use treatment counselors. It addresses the reasons people use alcohol and ways to keep them from drinking again. The goals of talk therapy are to help people with alcohol use disorder find healthy activities and ways to cope with life stress, to identify and avoid triggers for alcohol use, and to handle cravings, which can cause relapse.  Medicines.Different medicines can help treat alcohol use disorder through the following actions:  Decrease alcohol cravings.  Decrease the positive reward response felt from alcohol use.  Produce an uncomfortable physical reaction when alcohol is used (aversion therapy).  Support groups. Support groups are run by people who have quit drinking. They provide emotional support, advice, and guidance. These forms of treatment are often combined. Some people with alcohol use disorder benefit from intensive combination treatment provided by specialized substance use treatment centers. Both inpatient and outpatient treatment programs are available.   This information is not intended to replace advice given to you by your health care provider. Make sure you discuss any questions you have with your health care provider.   Document Released: 03/25/2004 Document Revised: 03/08/2014 Document Reviewed: 05/25/2012 Elsevier Interactive Patient Education 2016 [employer].  Do Not use

## 2015-05-01 NOTE — ED Notes (Signed)
Pt admits to wanting help with detox. Denies SI/HI. Pt has not drank in 2 years. Denies any other substance use.

## 2015-05-01 NOTE — ED Notes (Signed)
Bed: [facility] Expected date:  Expected time:  Means of arrival:  Comments: 

## 2015-05-01 NOTE — ED Notes (Signed)
Pt is asleep, will not wake up to be assessed. Pt NAD

## 2015-05-01 NOTE — ED Notes (Signed)
Pt highly intoxicated, vomiting, drank copious amounts of wine and liquor today. Pt had been sober for 20 years, relapsed some point in the past couple days.

## 2015-05-05 ENCOUNTER — Ambulatory Visit (INDEPENDENT_AMBULATORY_CARE_PROVIDER_SITE_OTHER): Payer: BLUE CROSS/BLUE SHIELD | Admitting: Physician Assistant

## 2015-05-05 ENCOUNTER — Ambulatory Visit (INDEPENDENT_AMBULATORY_CARE_PROVIDER_SITE_OTHER): Payer: BLUE CROSS/BLUE SHIELD

## 2015-05-05 VITALS — BP 158/96 | HR 93 | Temp 98.5°F | Resp 16 | Ht 69.5 in | Wt 202.0 lb

## 2015-05-05 DIAGNOSIS — M546 Pain in thoracic spine: Secondary | ICD-10-CM

## 2015-05-05 DIAGNOSIS — M25512 Pain in left shoulder: Secondary | ICD-10-CM

## 2015-05-05 DIAGNOSIS — M5412 Radiculopathy, cervical region: Secondary | ICD-10-CM

## 2015-05-05 DIAGNOSIS — F1010 Alcohol abuse, uncomplicated: Secondary | ICD-10-CM | POA: Diagnosis not present

## 2015-05-05 MED ORDER — MELOXICAM 7.5 MG PO TABS: 7.5000 mg | ORAL_TABLET | Freq: Every day | ORAL | Status: DC

## 2015-05-05 NOTE — Patient Instructions (Addendum)
Because you received an x-ray today, you will receive an invoice from [facility]. Please contact [facility] at [phone] with questions or concerns regarding your invoice. Our billing staff will not be able to assist you with those questions.  Please ice the neck and shoulder 3 times per day for 15 minutes.  I would like you to pick 3 pictures and do them 3 times per day.  Do the exercise in 8 reps.  If it says to hold, hold for 10 seconds.  Please return if your pain does not improve within a week.     Cervical Strain and Sprain With Rehab Cervical strain and sprain are injuries that commonly occur with "whiplash" injuries. Whiplash occurs when the neck is forcefully whipped backward or forward, such as during a motor vehicle accident or during contact sports. The muscles, ligaments, tendons, discs, and nerves of the neck are susceptible to injury when this occurs. RISK FACTORS Risk of having a whiplash injury increases if:  Osteoarthritis of the spine.  Situations that make head or neck accidents or trauma more likely.  High-risk sports (football, rugby, wrestling, hockey, auto racing, gymnastics, diving, contact karate, or boxing).  Poor strength and flexibility of the neck.  Previous neck injury.  Poor tackling technique.  Improperly fitted or padded equipment. SYMPTOMS   Pain or stiffness in the front or back of neck or both.  Symptoms may present immediately or up to 24 hours after injury.  Dizziness, headache, nausea, and vomiting.  Muscle spasm with soreness and stiffness in the neck.  Tenderness and swelling at the injury site. PREVENTION  Learn and use proper technique (avoid tackling with the head, spearing, and head-butting; use proper falling techniques to avoid landing on the head).  Warm up and stretch properly before activity.  Maintain physical fitness:  Strength, flexibility, and endurance.  Cardiovascular fitness.  Wear properly  fitted and padded protective equipment, such as padded soft collars, for participation in contact sports. PROGNOSIS  Recovery from cervical strain and sprain injuries is dependent on the extent of the injury. These injuries are usually curable in 1 week to 3 months with appropriate treatment.  RELATED COMPLICATIONS   Temporary numbness and weakness may occur if the nerve roots are damaged, and this may persist until the nerve has completely healed.  Chronic pain due to frequent recurrence of symptoms.  Prolonged healing, especially if activity is resumed too soon (before complete recovery). TREATMENT  Treatment initially involves the use of ice and medication to help reduce pain and inflammation. It is also important to perform strengthening and stretching exercises and modify activities that worsen symptoms so the injury does not get worse. These exercises may be performed at home or with a therapist. For patients who experience severe symptoms, a soft, padded collar may be recommended to be worn around the neck.  Improving your posture may help reduce symptoms. Posture improvement includes pulling your chin and abdomen in while sitting or standing. If you are sitting, sit in a firm chair with your buttocks against the back of the chair. While sleeping, try replacing your pillow with a small towel rolled to 2 inches in diameter, or use a cervical pillow or soft cervical collar. Poor sleeping positions delay healing.  For patients with nerve root damage, which causes numbness or weakness, the use of a cervical traction apparatus may be recommended. Surgery is rarely necessary for these injuries. However, cervical strain and sprains that are present at birth (congenital) may require  surgery. MEDICATION   If pain medication is necessary, nonsteroidal anti-inflammatory medications, such as aspirin and ibuprofen, or other minor pain relievers, such as acetaminophen, are often recommended.  Do not take  pain medication for 7 days before surgery.  Prescription pain relievers may be given if deemed necessary by your caregiver. Use only as directed and only as much as you need. HEAT AND COLD:   Cold treatment (icing) relieves pain and reduces inflammation. Cold treatment should be applied for 10 to 15 minutes every 2 to 3 hours for inflammation and pain and immediately after any activity that aggravates your symptoms. Use ice packs or an ice massage.  Heat treatment may be used prior to performing the stretching and strengthening activities prescribed by your caregiver, physical therapist, or athletic trainer. Use a heat pack or a warm soak. SEEK MEDICAL CARE IF:   Symptoms get worse or do not improve in 2 weeks despite treatment.  New, unexplained symptoms develop (drugs used in treatment may produce side effects). EXERCISES RANGE OF MOTION (ROM) AND STRETCHING EXERCISES - Cervical Strain and Sprain These exercises may help you when beginning to rehabilitate your injury. In order to successfully resolve your symptoms, you must improve your posture. These exercises are designed to help reduce the forward-head and rounded-shoulder posture which contributes to this condition. Your symptoms may resolve with or without further involvement from your physician, physical therapist or athletic trainer. While completing these exercises, remember:   Restoring tissue flexibility helps normal motion to return to the joints. This allows healthier, less painful movement and activity.  An effective stretch should be held for at least 20 seconds, although you may need to begin with shorter hold times for comfort.  A stretch should never be painful. You should only feel a gentle lengthening or release in the stretched tissue. STRETCH- Axial Extensors  Lie on your back on the floor. You may bend your knees for comfort. Place a rolled-up hand towel or dish towel, about 2 inches in diameter, under the part of your  head that makes contact with the floor.  Gently tuck your chin, as if trying to make a "double chin," until you feel a gentle stretch at the base of your head.  Hold __________ seconds. Repeat __________ times. Complete this exercise __________ times per day.  STRETCH - Axial Extension   Stand or sit on a firm surface. Assume a good posture: chest up, shoulders drawn back, abdominal muscles slightly tense, knees unlocked (if standing) and feet hip width apart.  Slowly retract your chin so your head slides back and your chin slightly lowers. Continue to look straight ahead.  You should feel a gentle stretch in the back of your head. Be certain not to feel an aggressive stretch since this can cause headaches later.  Hold for __________ seconds. Repeat __________ times. Complete this exercise __________ times per day. STRETCH - Cervical Side Bend   Stand or sit on a firm surface. Assume a good posture: chest up, shoulders drawn back, abdominal muscles slightly tense, knees unlocked (if standing) and feet hip width apart.  Without letting your nose or shoulders move, slowly tip your right / left ear to your shoulder until your feel a gentle stretch in the muscles on the opposite side of your neck.  Hold __________ seconds. Repeat __________ times. Complete this exercise __________ times per day. STRETCH - Cervical Rotators   Stand or sit on a firm surface. Assume a good posture: chest up, shoulders drawn  back, abdominal muscles slightly tense, knees unlocked (if standing) and feet hip width apart.  Keeping your eyes level with the ground, slowly turn your head until you feel a gentle stretch along the back and opposite side of your neck.  Hold __________ seconds. Repeat __________ times. Complete this exercise __________ times per day. RANGE OF MOTION - Neck Circles   Stand or sit on a firm surface. Assume a good posture: chest up, shoulders drawn back, abdominal muscles slightly tense,  knees unlocked (if standing) and feet hip width apart.  Gently roll your head down and around from the back of one shoulder to the back of the other. The motion should never be forced or painful.  Repeat the motion 10-20 times, or until you feel the neck muscles relax and loosen. Repeat __________ times. Complete the exercise __________ times per day. STRENGTHENING EXERCISES - Cervical Strain and Sprain These exercises may help you when beginning to rehabilitate your injury. They may resolve your symptoms with or without further involvement from your physician, physical therapist, or athletic trainer. While completing these exercises, remember:   Muscles can gain both the endurance and the strength needed for everyday activities through controlled exercises.  Complete these exercises as instructed by your physician, physical therapist, or athletic trainer. Progress the resistance and repetitions only as guided.  You may experience muscle soreness or fatigue, but the pain or discomfort you are trying to eliminate should never worsen during these exercises. If this pain does worsen, stop and make certain you are following the directions exactly. If the pain is still present after adjustments, discontinue the exercise until you can discuss the trouble with your clinician. STRENGTH - Cervical Flexors, Isometric  Face a wall, standing about 6 inches away. Place a small pillow, a ball about 6-8 inches in diameter, or a folded towel between your forehead and the wall.  Slightly tuck your chin and gently push your forehead into the soft object. Push only with mild to moderate intensity, building up tension gradually. Keep your jaw and forehead relaxed.  Hold 10 to 20 seconds. Keep your breathing relaxed.  Release the tension slowly. Relax your neck muscles completely before you start the next repetition. Repeat __________ times. Complete this exercise __________ times per day. STRENGTH- Cervical  Lateral Flexors, Isometric   Stand about 6 inches away from a wall. Place a small pillow, a ball about 6-8 inches in diameter, or a folded towel between the side of your head and the wall.  Slightly tuck your chin and gently tilt your head into the soft object. Push only with mild to moderate intensity, building up tension gradually. Keep your jaw and forehead relaxed.  Hold 10 to 20 seconds. Keep your breathing relaxed.  Release the tension slowly. Relax your neck muscles completely before you start the next repetition. Repeat __________ times. Complete this exercise __________ times per day. STRENGTH - Cervical Extensors, Isometric   Stand about 6 inches away from a wall. Place a small pillow, a ball about 6-8 inches in diameter, or a folded towel between the back of your head and the wall.  Slightly tuck your chin and gently tilt your head back into the soft object. Push only with mild to moderate intensity, building up tension gradually. Keep your jaw and forehead relaxed.  Hold 10 to 20 seconds. Keep your breathing relaxed.  Release the tension slowly. Relax your neck muscles completely before you start the next repetition. Repeat __________ times. Complete this exercise __________  times per day. POSTURE AND BODY MECHANICS CONSIDERATIONS - Cervical Strain and Sprain Keeping correct posture when sitting, standing or completing your activities will reduce the stress put on different body tissues, allowing injured tissues a chance to heal and limiting painful experiences. The following are general guidelines for improved posture. Your physician or physical therapist will provide you with any instructions specific to your needs. While reading these guidelines, remember:  The exercises prescribed by your provider will help you have the flexibility and strength to maintain correct postures.  The correct posture provides the optimal environment for your joints to work. All of your joints have  less wear and tear when properly supported by a spine with good posture. This means you will experience a healthier, less painful body.  Correct posture must be practiced with all of your activities, especially prolonged sitting and standing. Correct posture is as important when doing repetitive low-stress activities (typing) as it is when doing a single heavy-load activity (lifting). PROLONGED STANDING WHILE SLIGHTLY LEANING FORWARD When completing a task that requires you to lean forward while standing in one place for a long time, place either foot up on a stationary 2- to 4-inch high object to help maintain the best posture. When both feet are on the ground, the low back tends to lose its slight inward curve. If this curve flattens (or becomes too large), then the back and your other joints will experience too much stress, fatigue more quickly, and can cause pain.  RESTING POSITIONS Consider which positions are most painful for you when choosing a resting position. If you have pain with flexion-based activities (sitting, bending, stooping, squatting), choose a position that allows you to rest in a less flexed posture. You would want to avoid curling into a fetal position on your side. If your pain worsens with extension-based activities (prolonged standing, working overhead), avoid resting in an extended position such as sleeping on your stomach. Most people will find more comfort when they rest with their spine in a more neutral position, neither too rounded nor too arched. Lying on a non-sagging bed on your side with a pillow between your knees, or on your back with a pillow under your knees will often provide some relief. Keep in mind, being in any one position for a prolonged period of time, no matter how correct your posture, can still lead to stiffness. WALKING Walk with an upright posture. Your ears, shoulders, and hips should all line up. OFFICE WORK When working at a desk, create an  environment that supports good, upright posture. Without extra support, muscles fatigue and lead to excessive strain on joints and other tissues. CHAIR:  A chair should be able to slide under your desk when your back makes contact with the back of the chair. This allows you to work closely.  The chair's height should allow your eyes to be level with the upper part of your monitor and your hands to be slightly lower than your elbows.  Body position:  Your feet should make contact with the floor. If this is not possible, use a foot rest.  Keep your ears over your shoulders. This will reduce stress on your neck and low back.   This information is not intended to replace advice given to you by your health care provider. Make sure you discuss any questions you have with your health care provider.   Document Released: 02/15/2005 Document Revised: 03/08/2014 Document Reviewed: 05/30/2008 Elsevier Interactive Patient Education [employer].  Generic Shoulder Exercises EXERCISES  RANGE OF MOTION (ROM) AND STRETCHING EXERCISES These exercises may help you when beginning to rehabilitate your injury. Your symptoms may resolve with or without further involvement from your physician, physical therapist or athletic trainer. While completing these exercises, remember:   Restoring tissue flexibility helps normal motion to return to the joints. This allows healthier, less painful movement and activity.  An effective stretch should be held for at least 30 seconds.  A stretch should never be painful. You should only feel a gentle lengthening or release in the stretched tissue. ROM - Pendulum  Bend at the waist so that your right / left arm falls away from your body. Support yourself with your opposite hand on a solid surface, such as a table or a countertop.  Your right / left arm should be perpendicular to the ground. If it is not perpendicular, you need to lean over farther. Relax the muscles in  your right / left arm and shoulder as much as possible.  Gently sway your hips and trunk so they move your right / left arm without any use of your right / left shoulder muscles.  Progress your movements so that your right / left arm moves side to side, then forward and backward, and finally, both clockwise and counterclockwise.  Complete __________ repetitions in each direction. Many people use this exercise to relieve discomfort in their shoulder as well as to gain range of motion. Repeat __________ times. Complete this exercise __________ times per day. STRETCH - Flexion, Standing  Stand with good posture. With an underhand grip on your right / left hand and an overhand grip on the opposite hand, grasp a broomstick or cane so that your hands are a little more than shoulder-width apart.  Keeping your right / left elbow straight and shoulder muscles relaxed, push the stick with your opposite hand to raise your right / left arm in front of your body and then overhead. Raise your arm until you feel a stretch in your right / left shoulder, but before you have increased shoulder pain.  Try to avoid shrugging your right / left shoulder as your arm rises by keeping your shoulder blade tucked down and toward your mid-back spine. Hold __________ seconds.  Slowly return to the starting position. Repeat __________ times. Complete this exercise __________ times per day. STRETCH - Internal Rotation  Place your right / left hand behind your back, palm-up.  Throw a towel or belt over your opposite shoulder. Grasp the towel/belt with your right / left hand.  While keeping an upright posture, gently pull up on the towel/belt until you feel a stretch in the front of your right / left shoulder.  Avoid shrugging your right / left shoulder as your arm rises by keeping your shoulder blade tucked down and toward your mid-back spine.  Hold __________. Release the stretch by lowering your opposite hand. Repeat  __________ times. Complete this exercise __________ times per day. STRETCH - External Rotation and Abduction  Stagger your stance through a doorframe. It does not matter which foot is forward.  As instructed by your physician, physical therapist or athletic trainer, place your hands:  And forearms above your head and on the door frame.  And forearms at head-height and on the door frame.  At elbow-height and on the door frame.  Keeping your head and chest upright and your stomach muscles tight to prevent over-extending your low-back, slowly shift your weight onto your front foot until  you feel a stretch across your chest and/or in the front of your shoulders.  Hold __________ seconds. Shift your weight to your back foot to release the stretch. Repeat __________ times. Complete this stretch __________ times per day.  STRENGTHENING EXERCISES  These exercises may help you when beginning to rehabilitate your injury. They may resolve your symptoms with or without further involvement from your physician, physical therapist or athletic trainer. While completing these exercises, remember:   Muscles can gain both the endurance and the strength needed for everyday activities through controlled exercises.  Complete these exercises as instructed by your physician, physical therapist or athletic trainer. Progress the resistance and repetitions only as guided.  You may experience muscle soreness or fatigue, but the pain or discomfort you are trying to eliminate should never worsen during these exercises. If this pain does worsen, stop and make certain you are following the directions exactly. If the pain is still present after adjustments, discontinue the exercise until you can discuss the trouble with your clinician.  If advised by your physician, during your recovery, avoid activity or exercises which involve actions that place your right / left hand or elbow above your head or behind your back or head.  These positions stress the tissues which are trying to heal. STRENGTH - Scapular Depression and Adduction  With good posture, sit on a firm chair. Supported your arms in front of you with pillows, arm rests or a table top. Have your elbows in line with the sides of your body.  Gently draw your shoulder blades down and toward your mid-back spine. Gradually increase the tension without tensing the muscles along the top of your shoulders and the back of your neck.  Hold for __________ seconds. Slowly release the tension and relax your muscles completely before completing the next repetition.  After you have practiced this exercise, remove the arm support and complete it in standing as well as sitting. Repeat __________ times. Complete this exercise __________ times per day.  STRENGTH - External Rotators  Secure a rubber exercise band/tubing to a fixed object so that it is at the same height as your right / left elbow when you are standing or sitting on a firm surface.  Stand or sit so that the secured exercise band/tubing is at your side that is not injured.  Bend your elbow 90 degrees. Place a folded towel or small pillow under your right / left arm so that your elbow is a few inches away from your side.  Keeping the tension on the exercise band/tubing, pull it away from your body, as if pivoting on your elbow. Be sure to keep your body steady so that the movement is only coming from your shoulder rotating.  Hold __________ seconds. Release the tension in a controlled manner as you return to the starting position. Repeat __________ times. Complete this exercise __________ times per day.  STRENGTH - Supraspinatus  Stand or sit with good posture. Grasp a __________ weight or an exercise band/tubing so that your hand is "thumbs-up," like when you shake hands.  Slowly lift your right / left hand from your thigh into the air, traveling about 30 degrees from straight out at your side. Lift your  hand to shoulder height or as far as you can without increasing any shoulder pain. Initially, many people do not lift their hands above shoulder height.  Avoid shrugging your right / left shoulder as your arm rises by keeping your shoulder blade tucked down and  toward your mid-back spine.  Hold for __________ seconds. Control the descent of your hand as you slowly return to your starting position. Repeat __________ times. Complete this exercise __________ times per day.  STRENGTH - Shoulder Extensors  Secure a rubber exercise band/tubing so that it is at the height of your shoulders when you are either standing or sitting on a firm arm-less chair.  With a thumbs-up grip, grasp an end of the band/tubing in each hand. Straighten your elbows and lift your hands straight in front of you at shoulder height. Step back away from the secured end of band/tubing until it becomes tense.  Squeezing your shoulder blades together, pull your hands down to the sides of your thighs. Do not allow your hands to go behind you.  Hold for __________ seconds. Slowly ease the tension on the band/tubing as you reverse the directions and return to the starting position. Repeat __________ times. Complete this exercise __________ times per day.  STRENGTH - Scapular Retractors  Secure a rubber exercise band/tubing so that it is at the height of your shoulders when you are either standing or sitting on a firm arm-less chair.  With a palm-down grip, grasp an end of the band/tubing in each hand. Straighten your elbows and lift your hands straight in front of you at shoulder height. Step back away from the secured end of band/tubing until it becomes tense.  Squeezing your shoulder blades together, draw your elbows back as you bend them. Keep your upper arm lifted away from your body throughout the exercise.  Hold __________ seconds. Slowly ease the tension on the band/tubing as you reverse the directions and return to the  starting position. Repeat __________ times. Complete this exercise __________ times per day. STRENGTH - Scapular Depressors  Find a sturdy chair without wheels, such as a from a dining room table.  Keeping your feet on the floor, lift your bottom from the seat and lock your elbows.  Keeping your elbows straight, allow gravity to pull your body weight down. Your shoulders will rise toward your ears.  Raise your body against gravity by drawing your shoulder blades down your back, shortening the distance between your shoulders and ears. Although your feet should always maintain contact with the floor, your feet should progressively support less body weight as you get stronger.  Hold __________ seconds. In a controlled and slow manner, lower your body weight to begin the next repetition. Repeat __________ times. Complete this exercise __________ times per day.    This information is not intended to replace advice given to you by your health care provider. Make sure you discuss any questions you have with your health care provider.   Document Released: 12/30/2004 Document Revised: 03/08/2014 Document Reviewed: 05/30/2008 Elsevier Interactive Patient Education [employer].

## 2015-05-05 NOTE — Progress Notes (Signed)
[facility] [address], [place] [address] [phone]  Date:  05/05/2015   Name:  Brandon Mullen   DOB:  Jul 01, 1975   MRN:  [id number]  PCP:  No primary care provider on file.    History of Present Illness:  Brandon Mullen is a 40 y.o. male patient who presents to [facility] for follow up neck pain and upper back pain. Patient was seen 2 weeks ago with complaints of 1 week of cervical/upper thoracic back pain.  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  Patient reports that he thought that the pain was improving with rest, but when he returned to work last week, he started having the pain again at his upper back.  There is predominant pain at the left shoulder that radiates anteriorly down to crux of elbow with tingling.  He states that the mobic has improved the pain somewhat, but the flexeril did not do anything for his pain.  He denies weakness.  He ices rarely.  Stretches are for reaching at upper shelves".    Patient endorses end up in the hospital 4 days ago, due to [facility] intoxication.  He claims that this was the only day.  He was feeling low about his work and shoulder pain.  He currently is going to AA daily.  He lives behind the  Meeting location.  He currently has a sponsor, and attended 6 sessions this past weekend.  He states that this is well controlled, and he has not had a drink since this episode.  Patient Active Problem List   Diagnosis Date Noted  . Alcohol abuse 06/23/2013  . Unspecified hypothyroidism 06/04/2013  . Moderate alcohol use disorder ([place]) 06/02/2013  . MDD (major depressive disorder), recurrent episode ([place]) 06/02/2013  . Anxiety state, unspecified 06/02/2013    Past Medical History  Diagnosis Date  . Alcohol abuse   . Hernia, umbilical   . Hypothyroidism   . Hypertension     Past Surgical History  Procedure Laterality Date  . Splenectomy, total    . Splinectomy       Social History  Substance Use Topics  . Smoking status: Never Smoker   . Smokeless tobacco: Current User    Types: Chew  . Alcohol Use: No     Comment: Quit 2015 -- April 9th , 2015    Family History  Problem Relation Age of Onset  . Hyperlipidemia Mother   . Hyperlipidemia Father     No Known Allergies  Medication list has been reviewed and updated.  Current Outpatient Prescriptions on File Prior to Visit  Medication Sig Dispense Refill  . cyclobenzaprine (FLEXERIL) 5 MG tablet 1 pill by mouth up to every 8 hours as needed. Start with one pill by mouth each bedtime as needed due to sedation 15 tablet 0  . levothyroxine (SYNTHROID, LEVOTHROID) 150 MCG tablet Take 1 tablet (150 mcg total) by mouth daily before breakfast. For low thyroid function 30 tablet 0  . meloxicam (MOBIC) 7.5 MG tablet Take 1 tablet (7.5 mg total) by mouth daily. 15 tablet 0   No current facility-administered medications on file prior to visit.    ROS ROS otherwise unremarkable unless listed above.  Physical Examination: BP 158/96 mmHg  Pulse 93  Temp(Src) 98.5 F (36.9 C) (Oral)  Resp 16  Ht 5' 9.5" (1.765 m)  Wt 202 lb (91.627  kg)  BMI 29.41 kg/m2  SpO2 98% Ideal Body Weight: Weight in (lb) to have BMI = 25: 171.4  Physical Exam  Constitutional: He is oriented to person, place, and time. He appears well-developed and well-nourished. No distress.  HENT:  Head: Normocephalic and atraumatic.  Eyes: Conjunctivae and EOM are normal. Pupils are equal, round, and reactive to light.  Cardiovascular: Normal rate.   Pulmonary/Chest: Effort normal. No respiratory distress.  Musculoskeletal:       Cervical back: He exhibits tenderness (cervical spinous tenderness at the c5-t1 region upon palpation.  ). He exhibits no swelling and no edema.  Tender at the posterior shoulder.  There is pain incited with internal rotation, though normal ROM.  +Hawkins  Negative Tinel, phalen.  Neurological: He is  alert and oriented to person, place, and time.  Skin: Skin is warm and dry. He is not diaphoretic.  Psychiatric: He has a normal mood and affect. His behavior is normal.   Dg Cervical Spine Complete  05/05/2015  CLINICAL DATA:  40 year old presenting neck pain radiating into the left upper extremity. EXAM: CERVICAL SPINE - COMPLETE 4+ VIEW COMPARISON:  None. FINDINGS: Anatomic alignment. No visible fractures. Moderate disc space narrowing at C6-7. Remaining disc spaces well preserved. Normal prevertebral soft tissues. No visible bony foraminal stenoses, allowing for the degree of obliquity. No static evidence of instability. IMPRESSION: Moderate degenerative disc disease at C6-7. Electronically Signed   By: Evangeline Dakin M.D.   On: 05/05/2015 10:05   Dg Thoracic Spine 2 View  05/05/2015  CLINICAL DATA:  Upper back pain EXAM: THORACIC SPINE 2 VIEWS COMPARISON:  None FINDINGS: Normal alignment of the thoracic vertebral bodies. No loss of vertebral body height or disc height. No subluxation. Normal paraspinal lines. IMPRESSION: No acute osseous abnormality. Electronically Signed   By: Suzy Bouchard M.D.   On: 05/05/2015 10:10   Dg Shoulder Left  05/05/2015  CLINICAL DATA:  Pain.  Initial evaluation . EXAM: LEFT SHOULDER - 2+ VIEW COMPARISON:  None. FINDINGS: No acute bony or joint abnormality identified. No evidence fracture or dislocation. Mild acromioclavicular glenohumeral degenerative change. IMPRESSION: Mild acromioclavicular and glenohumeral degenerative change. No acute abnormality. Electronically Signed   By: Marcello Moores  Register   On: 05/05/2015 10:04    Procedure: verbal consent obtained.  povidine placed at the injection site once subacromial location found.  Ethyl chloride spray placed for numbness. .5% marcaine/kenalog placed into the subacromial joint.  Cleansed with alcohol pad with pressure added.  Dressing applied.   Assessment and Plan: Brandon Mullen is a 40 y.o. male who is here today  for follow up of left shoulder and neck pain. We will refill the low dose anti-inflammatory.  Advised of precautionary use. He will do stretches and neck exercises as discussed.  Ice 3 times per day for 15 minutes.   He is to rtc if his sxs do not improve in 2 weeks, where ortho consult may be solicited.   EtOH: reported controlled at this time. Left-sided thoracic back pain  Cervical radicular pain - Plan: DG Cervical Spine Complete, meloxicam (MOBIC) 7.5 MG tablet  Thoracic spine pain - Plan: DG Shoulder Left, DG Thoracic Spine 2 View  Pain in joint of left shoulder - Plan: DG Shoulder Left, DG Cervical Spine Complete, DG Thoracic Spine 2 View  Alcohol abuse  Ivar Drape, PA-C [facility] 05/05/2015 9:04 AM

## 2015-05-06 NOTE — ED Provider Notes (Signed)
CSN: [id number]     Arrival date & time 05/01/15  1310 History   First MD Initiated Contact with Patient 05/01/15 1402     Chief Complaint  Patient presents with  . Alcohol Intoxication     (Consider location/radiation/quality/duration/timing/severity/associated sxs/prior Treatment) HPI   40 year old male with alcohol intoxication. Patient admits to drinking heavily earlier today. He denies any other coingestions. She gives back and forth between feel like he needs detox or not. Denies any suicidal homicidal ideation. Reports left shoulder pain but that this is chronic. Reports that he drinks to help alleviate this pain. He has no acute pain complaints otherwise. Denies acute trauma.   Past Medical History  Diagnosis Date  . Alcohol abuse   . Hernia, umbilical   . Hypothyroidism   . Hypertension    Past Surgical History  Procedure Laterality Date  . Splenectomy, total    . Splinectomy     Family History  Problem Relation Age of Onset  . Hyperlipidemia Mother   . Hyperlipidemia Father    Social History  Substance Use Topics  . Smoking status: Never Smoker   . Smokeless tobacco: Current User    Types: Chew  . Alcohol Use: No     Comment: Quit 2015 -- April 9th , 2015    Review of Systems  All systems reviewed and negative, other than as noted in HPI.   Allergies  Review of patient's allergies indicates no known allergies.  Home Medications   Prior to Admission medications   Medication Sig Start Date End Date Taking? Authorizing Provider  cyclobenzaprine (FLEXERIL) 5 MG tablet 1 pill by mouth up to every 8 hours as needed. Start with one pill by mouth each bedtime as needed due to sedation 04/22/15   Wendie Agreste, MD  levothyroxine (SYNTHROID, LEVOTHROID) 150 MCG tablet Take 1 tablet (150 mcg total) by mouth daily before breakfast. For low thyroid function 09/24/14   Comer Locket, PA-C  meloxicam (MOBIC) 7.5 MG tablet Take 1 tablet (7.5 mg total) by mouth  daily. 05/05/15   Dorian Heckle English, PA   BP 124/87 mmHg  Pulse 88  Temp(Src) 97.9 F (36.6 C)  Resp 16  SpO2 98% Physical Exam  Constitutional: He appears well-developed and well-nourished. No distress.  Clinically intoxicated. Making some inappropriate remarks. His speech is clear though and he is following commands. Is able to sit up and ambulate unassisted.  HENT:  Head: Normocephalic and atraumatic.  Eyes: Conjunctivae are normal. Right eye exhibits no discharge. Left eye exhibits no discharge.  Neck: Neck supple.  Cardiovascular: Normal rate, regular rhythm and normal heart sounds.  Exam reveals no gallop and no friction rub.   No murmur heard. Pulmonary/Chest: Effort normal and breath sounds normal. No respiratory distress.  Abdominal: Soft. He exhibits no distension. There is no tenderness.  Musculoskeletal: He exhibits no edema or tenderness.  No external signs of acute trauma  Neurological: He is alert. No cranial nerve deficit. He exhibits normal muscle tone.  Skin: Skin is warm and dry.  Psychiatric: Thought content normal.  Nursing note and vitals reviewed.   ED Course  Procedures (including critical care time) Labs Review Labs Reviewed - No data to display  Imaging Review Dg Cervical Spine Complete  05/05/2015  CLINICAL DATA:  40 year old presenting neck pain radiating into the left upper extremity. EXAM: CERVICAL SPINE - COMPLETE 4+ VIEW COMPARISON:  None. FINDINGS: Anatomic alignment. No visible fractures. Moderate disc space narrowing at C6-7. Remaining disc spaces  well preserved. Normal prevertebral soft tissues. No visible bony foraminal stenoses, allowing for the degree of obliquity. No static evidence of instability. IMPRESSION: Moderate degenerative disc disease at C6-7. Electronically Signed   By: Evangeline Dakin M.D.   On: 05/05/2015 10:05   Dg Thoracic Spine 2 View  05/05/2015  CLINICAL DATA:  Upper back pain EXAM: THORACIC SPINE 2 VIEWS COMPARISON:  None  FINDINGS: Normal alignment of the thoracic vertebral bodies. No loss of vertebral body height or disc height. No subluxation. Normal paraspinal lines. IMPRESSION: No acute osseous abnormality. Electronically Signed   By: Suzy Bouchard M.D.   On: 05/05/2015 10:10   Dg Shoulder Left  05/05/2015  CLINICAL DATA:  Pain.  Initial evaluation . EXAM: LEFT SHOULDER - 2+ VIEW COMPARISON:  None. FINDINGS: No acute bony or joint abnormality identified. No evidence fracture or dislocation. Mild acromioclavicular glenohumeral degenerative change. IMPRESSION: Mild acromioclavicular and glenohumeral degenerative change. No acute abnormality. Electronically Signed   By: Marcello Moores  Register   On: 05/05/2015 10:04   I have personally reviewed and evaluated these images and lab results as part of my medical decision-making.   EKG Interpretation None      MDM   Final diagnoses:  Alcohol abuse    40 year old male with alcohol intoxication/abuse. Clinically sober enough for discharge. He can follow up with outpatient resources. He is not suicidal homicidal. He is not psychotic.    Virgel Manifold, MD 05/06/15 1536

## 2015-05-19 ENCOUNTER — Encounter (HOSPITAL_COMMUNITY): Payer: Self-pay | Admitting: Emergency Medicine

## 2015-05-19 DIAGNOSIS — I10 Essential (primary) hypertension: Secondary | ICD-10-CM | POA: Insufficient documentation

## 2015-05-19 DIAGNOSIS — F1010 Alcohol abuse, uncomplicated: Secondary | ICD-10-CM | POA: Insufficient documentation

## 2015-05-19 DIAGNOSIS — E039 Hypothyroidism, unspecified: Secondary | ICD-10-CM | POA: Insufficient documentation

## 2015-05-19 DIAGNOSIS — Z79899 Other long term (current) drug therapy: Secondary | ICD-10-CM | POA: Diagnosis not present

## 2015-05-19 LAB — COMPREHENSIVE METABOLIC PANEL
ALT: 20 U/L (ref 17–63)
AST: 22 U/L (ref 15–41)
Albumin: 4.2 g/dL (ref 3.5–5.0)
Alkaline Phosphatase: 76 U/L (ref 38–126)
Anion gap: 14 (ref 5–15)
BUN: 7 mg/dL (ref 6–20)
CO2: 28 mmol/L (ref 22–32)
Calcium: 9 mg/dL (ref 8.9–10.3)
Chloride: 101 mmol/L (ref 101–111)
Creatinine, Ser: 0.82 mg/dL (ref 0.61–1.24)
GFR calc Af Amer: >60 mL/min (ref >60)
GFR calc non Af Amer: >60 mL/min (ref >60)
Glucose, Bld: 118 mg/dL — ABNORMAL HIGH (ref 65–99)
Potassium: 3.6 mmol/L (ref 3.5–5.1)
Sodium: 143 mmol/L (ref 135–145)
Total Bilirubin: 0.6 mg/dL (ref 0.3–1.2)
Total Protein: 6.6 g/dL (ref 6.5–8.1)

## 2015-05-19 LAB — CBC
HCT: 49 % (ref 39.0–52.0)
Hemoglobin: 16.8 g/dL (ref 13.0–17.0)
MCH: 31.9 pg (ref 26.0–34.0)
MCHC: 34.3 g/dL (ref 30.0–36.0)
MCV: 93.2 fL (ref 78.0–100.0)
Platelets: 272 10*3/uL (ref 150–400)
RBC: 5.26 MIL/uL (ref 4.22–5.81)
RDW: 16.5 % — ABNORMAL HIGH (ref 11.5–15.5)
WBC: 11.8 10*3/uL — ABNORMAL HIGH (ref 4.0–10.5)

## 2015-05-19 LAB — ETHANOL: Alcohol, Ethyl (B): 320 mg/dL (ref <5)

## 2015-05-19 NOTE — ED Notes (Signed)
Dr. Ayesha Rumpf ( EDP ) notified on pt.'s elevated blood alcohol level , no new orders given .

## 2015-05-19 NOTE — ED Notes (Signed)
Pt reports he was sober for 3 years and 3 weeks ago he started drinking again. He has had 3 pints of vodka today. Pt denies SI. Pt sts he wants detox.

## 2015-05-20 ENCOUNTER — Emergency Department (HOSPITAL_COMMUNITY)
Admission: EM | Admit: 2015-05-20 | Discharge: 2015-05-20 | Disposition: A | Discharge status: Home / Self Care | Payer: BLUE CROSS/BLUE SHIELD | Attending: Emergency Medicine | Admitting: Emergency Medicine

## 2015-05-20 DIAGNOSIS — F1010 Alcohol abuse, uncomplicated: Secondary | ICD-10-CM

## 2015-05-20 MED ORDER — LORAZEPAM 1 MG PO TABS: ORAL_TABLET | ORAL | Status: DC

## 2015-05-20 NOTE — ED Provider Notes (Signed)
CSN: [id number]     Arrival date & time 05/19/15  2118 History   First MD Initiated Contact with Patient 05/20/15 0534     Chief Complaint  Patient presents with  . Alcohol Problem   Patient is a 40 y.o. male presenting with alcohol problem. The history is provided by the patient.  Alcohol Problem This is a recurrent problem. The current episode started more than 1 week ago. The problem occurs daily. The problem has been gradually worsening. Pertinent negatives include no chest pain, no headaches and no shortness of breath. Nothing aggravates the symptoms. Nothing relieves the symptoms.  pt reports h/o alcohol abuse He reports he was sober for 2 yrs, but relapsed 3 weeks ago and has drank everyday He reports tremors but no seizures No fever/vomiting No HA/CP No other pain complaints   Past Medical History  Diagnosis Date  . Alcohol abuse   . Hernia, umbilical   . Hypothyroidism   . Hypertension    Past Surgical History  Procedure Laterality Date  . Splenectomy, total    . Splinectomy     Family History  Problem Relation Age of Onset  . Hyperlipidemia Mother   . Hyperlipidemia Father    Social History  Substance Use Topics  . Smoking status: Never Smoker   . Smokeless tobacco: Current User    Types: Chew  . Alcohol Use: Yes     Comment: Quit 2015 -- April 9th , 2015    Review of Systems  Constitutional: Negative for fever.  Respiratory: Negative for shortness of breath.   Cardiovascular: Negative for chest pain.  Gastrointestinal: Negative for vomiting.  Neurological: Negative for seizures and headaches.  Psychiatric/Behavioral: Negative for suicidal ideas.  All other systems reviewed and are negative.     Allergies  Review of patient's allergies indicates no known allergies.  Home Medications   Prior to Admission medications   Medication Sig Start Date End Date Taking? Authorizing Provider  cyclobenzaprine (FLEXERIL) 5 MG tablet 1 pill by mouth up to  every 8 hours as needed. Start with one pill by mouth each bedtime as needed due to sedation 04/22/15   Wendie Agreste, MD  levothyroxine (SYNTHROID, LEVOTHROID) 150 MCG tablet Take 1 tablet (150 mcg total) by mouth daily before breakfast. For low thyroid function 09/24/14   Comer Locket, PA-C  LORazepam (ATIVAN) 1 MG tablet Take 3 tablets PO one first day, take 2 tablets PO on day 2, take 1 tablet PO on day 3 then stop 05/20/15   Ripley Fraise, MD  meloxicam (MOBIC) 7.5 MG tablet Take 1 tablet (7.5 mg total) by mouth daily. 05/05/15   Dorian Heckle English, PA   BP 135/98 mmHg  Pulse 88  Temp(Src) 97.6 F (36.4 C) (Oral)  Resp 18  SpO2 94% Physical Exam CONSTITUTIONAL: Pt is disheveled HEAD: Normocephalic/atraumatic EYES: EOMI ENMT: Mucous membranes moist NECK: supple no meningeal signs SPINE/BACK:entire spine nontender CV: S1/S2 noted, no murmurs/rubs/gallops noted LUNGS: Lungs are clear to auscultation bilaterally, no apparent distress ABDOMEN: soft, nontender NEURO: Pt is awake/alert/appropriate, moves all extremitiesx4.  No facial droop. Mild UE tremor noted  EXTREMITIES: pulses normal/equal, full ROM SKIN: warm, color normal PSYCH: no abnormalities of mood noted, alert and oriented to situation  ED Course  Procedures  Labs Review Labs Reviewed  COMPREHENSIVE METABOLIC PANEL - Abnormal; Notable for the following:    Glucose, Bld 118 (*)    All other components within normal limits  ETHANOL - Abnormal; Notable for the  following:    Alcohol, Ethyl (B) 320 (*)    All other components within normal limits  CBC - Abnormal; Notable for the following:    WBC 11.8 (*)    RDW 16.5 (*)    All other components within normal limits    I have personally reviewed and evaluated these   lab results as part of my medical decision-making.  Pt stable No acute distress No signs of significant withdrawal Given outpatient resources He requests ativan as outpatient, short tapering dose  ordered for patient  MDM   Final diagnoses:  Alcohol abuse    Nursing notes including past medical history and social history reviewed and considered in documentation Labs/vital reviewed myself and considered during evaluation     Ripley Fraise, MD 05/20/15 [id number]

## 2015-05-20 NOTE — Discharge Instructions (Signed)
Substance Abuse Treatment Programs ° °Intensive Outpatient Programs °[facility]     °[address]      °[place], [place]                   °[phone]      ° °[facility] °[address] °[place], [place] °[phone] ° °[facility]     °(Inpatient and outpatient)     °[address]           °[phone]   ° °[facility] °[phone] (Suboxone and Methadone) ° °[address]      °[place], [place] [address]      °[phone]      ° °[address] °[place], [place] °[phone] ° °[facility] (Outpatient/Inpatient, [facility])    °(insurance only) [phone]      °       °[facility] (Groups & Residential) °[place], [place] °[phone] ° °   °[facility]     °[address]     °[place], [place]      °[phone]      ° °[facility] (for caregivers and family) °[address] °[place], [place] °336-299-4655 ° ° ° ° ° °Residential Treatment Programs °[facility]      °[address], [place], [place] [address]  °[phone]      ° °[facility] °[address] [place], [place] [address] °[phone] ° °[facility]        °[phone]      ° °[facility] °1-800-659-3381 ° °[facility]             °[address]                                         °[place], [place]                                                °[phone] or [phone]                              ° °[facility] [place] °[address] °[place], [address] °[phone] ° °[facility]    °[address]      °[place], [place]     °[phone]      ° °[facility] °[address] °[place] °[phone] ° °[facility]   °[address]     °[place], [place] [address]     °[phone]      °Admissions: 8am-3pm M-F ° °[facility] °[address] °[place],  [place] °[phone] ° °[facility]: Residential Program (90 Days)   °[place], [place]      °336-725-8389 or [phone]    ° °[facility] °[place], [place] °(Walk in Hours over the weekend or by referral) ° °[facility] °[address], [place], [place] [address] °([phone] ° °Crisis Mobile: [facility]:  1-877-626-1772 (for crisis response 24 hours a day) °[facility] Hotline:      1-800-256-2452 °Outpatient Psychiatry and Counseling ° °[facility]: Mobile Crisis   Management 24 hours:  1-877-626-1772 ° °[facility] sliding scale fee and walk in schedule: M-F 8am-12pm/1pm-3pm °[address]  °[place], [place] [address] °[phone] ° °[facility] °[address] °[place], [place] [address] °[phone] ° °[facility] ([facility])- new patient walk-in appointments available Monday - Friday 8am -3pm.          °[address] °[place], [place] [address] °336-676-6840 or crisis line- 336-676-6905 ° °[facility]/ Intensive Outpatient Therapy Program °[address] °[place], [place] [address] °336-832-9804 ° °[facility]                  °Crisis Services      °[phone]      °[address]     °[place], [place] [address]                ° °[facility]   °[facility] °[phone] °[address] °[place], [place] [address] ° ° °[facility]          °[address],  °[place], [place] [address]       °[phone] ° °[facility] °[address] °[place], [place] [address] °336-292-1510 ° °[facility]    °[address]    °[place], [place] [address]     °[phone]      ° °[facility] McKinney, MD     °[address]     °[place] [address]     °[phone]     °  °[facility] °[address] °[place] [address] ° °[facility]     °[address]     °[place], [place]      °336-542-2076      ° °[facility] °Shamsher Ahluwalia, MD °[address] °[place], [place] [address] °[phone] ° °[facility]     °[address]     °[place], [place] [address]     °336-274-1237      ° °[facility] °[address] °[place], [place] [address] °336-854-4450 °Resources for Temporary Residential Assistance/Crisis Centers ° °DAY CENTERS °[facility] °M-F 8am-3pm   °[address] [place], [place] [address]   336-332-0824 °Services include: laundry, barbering, support groups, case management, phone  & computer access, showers, AA/NA mtgs, mental health/substance abuse nurse, job skills class, disability information, VA assistance, spiritual classes, etc.  ° °HOMELESS SHELTERS ° °[facility]     °[facility]   °[address], [place]     °[phone]       °       °[facility] (women and children)       °[address] °[place], [place] [address] °336-275-0820 °[email or web address] for application and process °Application Required ° °[facility]   °[address]    °[place] [address]     °[phone]       °             °[facility] °[address] °[place], [place] [address] °336.273.5572 °336-235-0363(schedule application appt.) °Application Required ° °[facility] (women only)    °[address]     °[place], [place] [address]     °336-884-1039      °  Intake starts 6pm daily °Need valid ID, SSC, & Police report °[facility] [place] °[address] °[place], [place] °336-881-5420 °Application Required ° °[facility] (men only)     °[address]      °[place], [place]     °[phone]      ° °[facility] °(Pregnant women only) °[address] °[place], [place] °336-275-0206 ° °[facility]      °[address]      °[place], [place] [address]     °[phone]      °       °[facility] °[address] °[place], [place] °336-723-1848 °90 day commitment/SA/Application process ° °[facility](men only)     °[address]     °[place], [place]     °[phone]       °Check-in at 7pm     °       °[facility] °[address] °[place], [place] [address] °336-248-6684 °Men/Women/Women and Children must be there by 7 pm ° °[facility] °[place], [place] °336-722-8721                ° °

## 2015-07-11 ENCOUNTER — Ambulatory Visit (HOSPITAL_COMMUNITY): Payer: Self-pay | Admitting: Psychology

## 2015-07-11 NOTE — Progress Notes (Unsigned)
Brandon Mullen is a 40 y.o. male patient. CD-IOP: NO SHOW. This patient was scheduled to appear for an assessment and orientation at 10 am this morning. He had been referred by the team at [facility]. He did not phone nor did he appear for this scheduled appointment.         Dex Blakely, LCAS

## 2015-09-12 ENCOUNTER — Telehealth: Payer: Self-pay | Admitting: Family Medicine

## 2015-09-12 ENCOUNTER — Ambulatory Visit: Payer: BLUE CROSS/BLUE SHIELD

## 2015-09-12 NOTE — Telephone Encounter (Signed)
Pt came into the office today wanting a refill of his medicine which he gets them through [facility] he has an outstanding balance so he cant be seen until paid I gave him a pamphlet that could help him out with his balance please respond to pt at [phone] he would like to use the [facility] on [address]   (1) FLEXERIL 5 MG (2) SYNTHROID LEVOTHROID 150 MCG (3) FLUOXETINE HCL 20 MG BID (4) NALTREXONE HYDROCHLORIDE TABLETS 50 MG

## 2015-09-12 NOTE — Telephone Encounter (Signed)
Refill from [place] not appropriate as we don't prescribe or manage any of this conditions per [facility].  Philis Fendt, MS, PA-C 10:34 AM, 09/12/2015

## 2015-09-12 NOTE — Telephone Encounter (Signed)
FROM LAST MESSAGE THE PATIENT PROVIDER IS DR Jerold Coombe [facility]

## 2015-10-09 ENCOUNTER — Telehealth: Payer: Self-pay | Admitting: Emergency Medicine

## 2015-10-09 NOTE — Telephone Encounter (Signed)
Left message to call back regarding medication refill request

## 2016-09-28 ENCOUNTER — Ambulatory Visit (INDEPENDENT_AMBULATORY_CARE_PROVIDER_SITE_OTHER): Payer: BLUE CROSS/BLUE SHIELD | Admitting: General Surgery

## 2016-09-28 ENCOUNTER — Encounter: Payer: Self-pay | Admitting: General Surgery

## 2016-09-28 ENCOUNTER — Telehealth: Payer: Self-pay

## 2016-09-28 VITALS — BP 124/82 | HR 74 | Resp 12 | Ht 67.0 in | Wt 187.0 lb

## 2016-09-28 DIAGNOSIS — K432 Incisional hernia without obstruction or gangrene: Secondary | ICD-10-CM

## 2016-09-28 NOTE — Telephone Encounter (Signed)
Message left for patient to call about his CT scan due to his insurance coverage.

## 2016-09-28 NOTE — Patient Instructions (Addendum)
The patient is aware to call back for any questions or concerns.  Steps to Quit Smoking Smoking tobacco can be bad for your health. It can also affect almost every organ in your body. Smoking puts you and people around you at risk for many serious long-lasting (chronic) diseases. Quitting smoking is hard, but it is one of the best things that you can do for your health. It is never too late to quit. What are the benefits of quitting smoking? When you quit smoking, you lower your risk for getting serious diseases and conditions. They can include:  Lung cancer or lung disease.  Heart disease.  Stroke.  Heart attack.  Not being able to have children (infertility).  Weak bones (osteoporosis) and broken bones (fractures).  If you have coughing, wheezing, and shortness of breath, those symptoms may get better when you quit. You may also get sick less often. If you are pregnant, quitting smoking can help to lower your chances of having a baby of low birth weight. What can I do to help me quit smoking? Talk with your doctor about what can help you quit smoking. Some things you can do (strategies) include:  Quitting smoking totally, instead of slowly cutting back how much you smoke over a period of time.  Going to in-person counseling. You are more likely to quit if you go to many counseling sessions.  Using resources and support systems, such as: ? Database administrator with a Social worker. ? Phone quitlines. ? Careers information officer. ? Support groups or group counseling. ? Text messaging programs. ? Mobile phone apps or applications.  Taking medicines. Some of these medicines may have nicotine in them. If you are pregnant or breastfeeding, do not take any medicines to quit smoking unless your doctor says it is okay. Talk with your doctor about counseling or other things that can help you.  Talk with your doctor about using more than one strategy at the same time, such as taking medicines while  you are also going to in-person counseling. This can help make quitting easier. What things can I do to make it easier to quit? Quitting smoking might feel very hard at first, but there is a lot that you can do to make it easier. Take these steps:  Talk to your family and friends. Ask them to support and encourage you.  Call phone quitlines, reach out to support groups, or work with a Social worker.  Ask people who smoke to not smoke around you.  Avoid places that make you want (trigger) to smoke, such as: ? Bars. ? Parties. ? Smoke-break areas at work.  Spend time with people who do not smoke.  Lower the stress in your life. Stress can make you want to smoke. Try these things to help your stress: ? Getting regular exercise. ? Deep-breathing exercises. ? Yoga. ? Meditating. ? Doing a body scan. To do this, close your eyes, focus on one area of your body at a time from head to toe, and notice which parts of your body are tense. Try to relax the muscles in those areas.  Download or buy apps on your mobile phone or tablet that can help you stick to your quit plan. There are many free apps, such as QuitGuide from the [employer] Office manager for Disease Control and Prevention). You can find more support from smokefree.gov and other websites.  This information is not intended to replace advice given to you by your health care provider. Make sure you discuss  any questions you have with your health care provider. Document Released: 12/12/2008 Document Revised: 10/14/2015 Document Reviewed: 07/02/2014 Elsevier Interactive Patient Education  2018 [employer].

## 2016-09-28 NOTE — Progress Notes (Signed)
Patient ID: Brandon Mullen, male   DOB: 09-Jul-1975, 41 y.o.   MRN: [id number]  Chief Complaint  Patient presents with  . Other    HPI [name] is a 41 y.o. male here today for a evaluation of a umbilical hernia. Patient noticed this are over ten years ago. He states the area has got bigger in the past two year, some pain with activity.    HPI  Past Medical History:  Diagnosis Date  . Alcohol abuse   . Hernia, umbilical   . Hypertension   . Hypothyroidism     Past Surgical History:  Procedure Laterality Date  . SPLENECTOMY, 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 57's     Family History  Problem Relation Age of Onset  . Hyperlipidemia Mother   . Hyperlipidemia Father     Social History Social History  Substance Use Topics  . Smoking status: Current Every Day Smoker    Packs/day: 1.00    Years: 20.00    Types: Cigarettes  . Smokeless tobacco: Current User    Types: Chew  . Alcohol use Yes     Comment: Quit 2015 -- April 9th , 2015    No Known Allergies  Current Outpatient Prescriptions  Medication Sig Dispense Refill  . levothyroxine (SYNTHROID, LEVOTHROID) 150 MCG tablet Take 1 tablet (150 mcg total) by mouth daily before breakfast. For low thyroid function 30 tablet 0   No current facility-administered medications for this visit.     Review of Systems Review of Systems  Constitutional: Negative.   Respiratory: Negative.   Cardiovascular: Negative.     Blood pressure 124/82, pulse 74, resp. rate 12, height 5\' 7"  (1.702 m), weight 187 lb (84.8 kg).  Physical Exam Physical Exam  Constitutional: He is oriented to person, place, and time. He appears well-developed and well-nourished.  HENT:  Mouth/Throat: Oropharynx is clear and moist.  Eyes: Conjunctivae are normal. No scleral icterus.  Neck: Neck supple.  Cardiovascular: Normal rate, regular rhythm and normal heart sounds.   Pulmonary/Chest: Effort normal and breath sounds normal.  Abdominal: Soft. Bowel sounds  are normal. There is no tenderness. A hernia is present. Hernia confirmed positive in the ventral area.    Lymphadenopathy:    He has no cervical adenopathy.  Neurological: He is alert and oriented to person, place, and time.  Skin: Skin is warm and dry.  Psychiatric: His behavior is normal.    Data Reviewed   Assessment    Ventral hernia-multiple midline defects     Plan   Findings explained to pt. Need imaging to assess extent of hernial defects. Surgical repair likely will entail component separation.  Pt is agreeable. Schedule CT scan to evaluate extent of incisional hernia       HPI, Physical Exam, Assessment and Plan have been scribed under the direction and in the presence of Mckinley Jewel, MD Karie Fetch, RN I have completed the exam and reviewed the above documentation for accuracy and completeness.  I agree with the above.  Haematologist has been used and any errors in dictation or transcription are unintentional.  Valisha Heslin G. Jamal Collin, M.D., F.A.C.S.   Junie Panning G 09/29/2016, 2:31 PM

## 2016-09-29 NOTE — Telephone Encounter (Signed)
I spoke with the patient about his insurance coverage for Radiology. If he has his scan at the [facility] he will have to meet his deductible first before they will cover the cost and then it will be 80/20 coverage. If he uses his insurance's choice facility, a [place] [facility],  then the scan will be covered at 100% per the insurance company. He expressed understanding of this conversation but wishes to precede with the scan at [facility].

## 2016-10-01 ENCOUNTER — Ambulatory Visit
Admission: RE | Admit: 2016-10-01 | Discharge: 2016-10-01 | Disposition: A | Discharge status: Home / Self Care | Payer: BLUE CROSS/BLUE SHIELD | Source: Ambulatory Visit | Attending: General Surgery | Admitting: General Surgery

## 2016-10-01 DIAGNOSIS — K439 Ventral hernia without obstruction or gangrene: Secondary | ICD-10-CM | POA: Diagnosis not present

## 2016-10-01 DIAGNOSIS — R911 Solitary pulmonary nodule: Secondary | ICD-10-CM | POA: Diagnosis not present

## 2016-10-01 DIAGNOSIS — K429 Umbilical hernia without obstruction or gangrene: Secondary | ICD-10-CM | POA: Insufficient documentation

## 2016-10-01 DIAGNOSIS — K432 Incisional hernia without obstruction or gangrene: Secondary | ICD-10-CM | POA: Diagnosis present

## 2016-10-01 MED ORDER — IOPAMIDOL (ISOVUE-300) INJECTION 61%
125.0000 mL | Freq: Once | INTRAVENOUS | Status: AC | PRN
  Administered 2016-10-01: 125 mL via INTRAVENOUS

## 2016-10-04 ENCOUNTER — Encounter: Payer: Self-pay | Admitting: General Surgery

## 2016-10-04 ENCOUNTER — Ambulatory Visit (INDEPENDENT_AMBULATORY_CARE_PROVIDER_SITE_OTHER): Payer: BLUE CROSS/BLUE SHIELD | Admitting: General Surgery

## 2016-10-04 VITALS — BP 138/82 | HR 78 | Resp 12 | Ht 68.0 in | Wt 192.0 lb

## 2016-10-04 DIAGNOSIS — K432 Incisional hernia without obstruction or gangrene: Secondary | ICD-10-CM

## 2016-10-04 NOTE — Addendum Note (Signed)
Addended by: Christene Lye on: 10/04/2016 02:07 PM   Modules accepted: Orders, SmartSet

## 2016-10-04 NOTE — Progress Notes (Signed)
Patient ID: Brandon Mullen, male   DOB: [birth year], 41 y.o.   MRN: [id number]  Chief Complaint  Patient presents with  . Follow-up    HPI Brandon Mullen is a 41 y.o. male here today to discuss results of CT scan done on 10/01/16 to further evaluate his abdominal hernia.  HPI  Past Medical History:  Diagnosis Date  . Alcohol abuse   . Hernia, umbilical   . Hypertension   . Hypothyroidism     Past Surgical History:  Procedure Laterality Date  . SPLENECTOMY, 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 54's     Family History  Problem Relation Age of Onset  . Hyperlipidemia Mother   . Hyperlipidemia Father     Social History Social History  Substance Use Topics  . Smoking status: Current Every Day Smoker    Packs/day: 1.00    Years: 20.00    Types: Cigarettes  . Smokeless tobacco: Current User    Types: Chew  . Alcohol use Yes     Comment: Quit 2015 -- April 9th , 2015    No Known Allergies  Current Outpatient Prescriptions  Medication Sig Dispense Refill  . levothyroxine (SYNTHROID, LEVOTHROID) 150 MCG tablet Take 1 tablet (150 mcg total) by mouth daily before breakfast. For low thyroid function 30 tablet 0   No current facility-administered medications for this visit.     Review of Systems Review of Systems  Constitutional: Negative.   Respiratory: Negative.   Cardiovascular: Negative.     Blood pressure 138/82, pulse 78, resp. rate 12, height 5\' 8"  (1.727 m), weight 192 lb (87.1 kg).  Physical Exam Physical Exam  Constitutional: He is oriented to person, place, and time. He appears well-developed and well-nourished.  Neurological: He is alert and oriented to person, place, and time.  Skin: Skin is warm and dry.  Psychiatric: He has a normal mood and affect.    Data Reviewed Prior note and CT scan reviewed.   Assessment   Incisional hernias - CT scan shows hernia with multiple midline defects with protruding fatty tissue, small amount of bowel in  in the umbilicus. 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 Pt is agreeable, would like to proceed with scheduling surgery.  Pt is a smoker. Re-emphacized the need for smoking cessation prior to surgery as it increases his risk for complications. Pt is willing and says he plans to start nicotine patches soon.  Pt has a history of alcohol abuse. Reports that he has had nothing to drink since April of 2017.   CT also showed a 5 mm subpleural nodule in the right lower lobe. This will require a 12 month follow-up CT of the chest. Patient advised    Plan     surgery to be scheduled as above   HPI, Physical Exam, Assessment and Plan have been scribed under the direction and in the presence of Mckinley Jewel, MD  Gaspar Cola, CMA  I have completed the exam and reviewed the above documentation for accuracy and completeness.  I agree with the above.  Haematologist has been used and any errors in dictation or transcription are unintentional.  Seeplaputhur G. Jamal Collin, M.D., F.A.C.S.  Junie Panning G 10/04/2016, 11:53 AM

## 2016-10-04 NOTE — Patient Instructions (Signed)
Continue to work on smoking cessation.  Follow-up TBA pending procedure scheduling.

## 2016-10-12 ENCOUNTER — Inpatient Hospital Stay: Admission: RE | Admit: 2016-10-12 | Payer: Self-pay | Source: Ambulatory Visit

## 2016-10-13 ENCOUNTER — Encounter
Admission: RE | Admit: 2016-10-13 | Discharge: 2016-10-13 | Disposition: A | Discharge status: Home / Self Care | Payer: BLUE CROSS/BLUE SHIELD | Source: Ambulatory Visit | Attending: General Surgery | Admitting: General Surgery

## 2016-10-13 HISTORY — DX: Unspecified osteoarthritis, unspecified site: M19.90

## 2016-10-13 HISTORY — DX: Gastro-esophageal reflux disease without esophagitis: K21.9

## 2016-10-13 NOTE — Patient Instructions (Signed)
  Your procedure is scheduled on: 10-19-16 Report to Same Day Surgery 2nd floor medical mall [facility] Entrance-take elevator on left to 2nd floor.  Check in with surgery information desk.) To find out your arrival time please call [phone] between 1PM - 3PM on 10-18-16  Remember: Instructions that are not followed completely may result in serious medical risk, up to and including death, or upon the discretion of your surgeon and anesthesiologist your surgery may need to be rescheduled.    _x___ 1. Do not eat food or drink liquids after midnight. No gum chewing or  hard candies.     __x__ 2. No Alcohol for 24 hours before or after surgery.   __x__3. No Smoking for 24 prior to surgery.   ____  4. Bring all medications with you on the day of surgery if instructed.    __x__ 5. Notify your doctor if there is any change in your medical condition     (cold, fever, infections).     Do not wear jewelry, make-up, hairpins, clips or nail polish.  Do not wear lotions, powders, or perfumes. You may wear deodorant.  Do not shave 48 hours prior to surgery. Men may shave face and neck.  Do not bring valuables to the hospital.    [facility] is not responsible for any belongings or valuables.               Contacts, dentures or bridgework may not be worn into surgery.  Leave your suitcase in the car. After surgery it may be brought to your room.  For patients admitted to the hospital, discharge time is determined by your treatment team.   Patients discharged the day of surgery will not be allowed to drive home.  You will need someone to drive you home and stay with you the night of your procedure.    Please read over the following fact sheets that you were given:     _x___ Take anti-hypertensive (unless it includes a diuretic), cardiac, seizure, asthma,     anti-reflux and psychiatric medicines. These include:  1. LEVOTHYROXINE  2.  3.  4.  5.  6.  ____Fleets enema or Magnesium  Citrate as directed.   _x___ Use CHG Soap or sage wipes as directed on instruction sheet   ____ Use inhalers on the day of surgery and bring to hospital day of surgery  ____ Stop Metformin and Janumet 2 days prior to surgery.    ____ Take 1/2 of usual insulin dose the night before surgery and none on the morning surgery.   ____ Follow recommendations from Cardiologist, Pulmonologist or PCP regarding stopping Aspirin, Coumadin, Pllavix ,Eliquis, Effient, or Pradaxa, and Pletal.  ____Stop Anti-inflammatories such as Advil, Aleve, Ibuprofen, Motrin, Naproxen, Naprosyn, Goodies powders or aspirin products. OK to take Tylenol    ____ Stop supplements until after surgery.   ____ Bring C-Pap to the hospital.

## 2016-10-19 ENCOUNTER — Ambulatory Visit: Payer: BLUE CROSS/BLUE SHIELD | Admitting: Anesthesiology

## 2016-10-19 ENCOUNTER — Encounter
Admission: RE | Disposition: A | Discharge status: Home / Self Care | Payer: Self-pay | Source: Ambulatory Visit | Attending: General Surgery

## 2016-10-19 ENCOUNTER — Encounter: Payer: Self-pay | Admitting: *Deleted

## 2016-10-19 ENCOUNTER — Observation Stay
Admission: RE | Admit: 2016-10-19 | Discharge: 2016-10-22 | Disposition: A | Discharge status: Home / Self Care | Payer: BLUE CROSS/BLUE SHIELD | Source: Ambulatory Visit | Attending: General Surgery | Admitting: General Surgery

## 2016-10-19 DIAGNOSIS — K432 Incisional hernia without obstruction or gangrene: Principal | ICD-10-CM | POA: Diagnosis present

## 2016-10-19 DIAGNOSIS — E039 Hypothyroidism, unspecified: Secondary | ICD-10-CM | POA: Diagnosis not present

## 2016-10-19 DIAGNOSIS — I10 Essential (primary) hypertension: Secondary | ICD-10-CM | POA: Diagnosis not present

## 2016-10-19 DIAGNOSIS — F17210 Nicotine dependence, cigarettes, uncomplicated: Secondary | ICD-10-CM | POA: Insufficient documentation

## 2016-10-19 DIAGNOSIS — Z79899 Other long term (current) drug therapy: Secondary | ICD-10-CM | POA: Insufficient documentation

## 2016-10-19 HISTORY — PX: INSERTION OF MESH: SHX5868

## 2016-10-19 HISTORY — PX: INCISIONAL HERNIA REPAIR: SHX193

## 2016-10-19 LAB — URINE DRUG SCREEN, QUALITATIVE (ARMC ONLY)
Amphetamines, Ur Screen: NOT DETECTED
Barbiturates, Ur Screen: NOT DETECTED
Benzodiazepine, Ur Scrn: NOT DETECTED
Cannabinoid 50 Ng, Ur ~~LOC~~: POSITIVE — AB
Cocaine Metabolite,Ur ~~LOC~~: NOT DETECTED
MDMA (Ecstasy)Ur Screen: NOT DETECTED
Methadone Scn, Ur: NOT DETECTED
Opiate, Ur Screen: NOT DETECTED
Phencyclidine (PCP) Ur S: NOT DETECTED
Tricyclic, Ur Screen: NOT DETECTED

## 2016-10-19 SURGERY — REPAIR, HERNIA, INCISIONAL
Anesthesia: General | Wound class: Clean

## 2016-10-19 MED ORDER — ACETAMINOPHEN 500 MG PO TABS
1000.0000 mg | ORAL_TABLET | Freq: Four times a day (QID) | ORAL | Status: DC
  Administered 2016-10-19 – 2016-10-22 (×8): 1000 mg via ORAL
  Filled 2016-10-19 (×9): qty 2

## 2016-10-19 MED ORDER — LIDOCAINE HCL (PF) 2 % IJ SOLN
INTRAMUSCULAR | Status: AC
  Filled 2016-10-19: qty 2

## 2016-10-19 MED ORDER — SUCCINYLCHOLINE CHLORIDE 20 MG/ML IJ SOLN
INTRAMUSCULAR | Status: AC
Start: 2016-10-19 — End: 2016-10-19
  Filled 2016-10-19: qty 1

## 2016-10-19 MED ORDER — LIDOCAINE HCL (PF) 1 % IJ SOLN
INTRAMUSCULAR | Status: AC
Start: 2016-10-19 — End: 2016-10-19
  Filled 2016-10-19: qty 30

## 2016-10-19 MED ORDER — DEXAMETHASONE SODIUM PHOSPHATE 10 MG/ML IJ SOLN
INTRAMUSCULAR | Status: DC | PRN
  Administered 2016-10-19: 10 mg via INTRAVENOUS

## 2016-10-19 MED ORDER — LACTATED RINGERS IV SOLN
INTRAVENOUS | Status: DC
  Administered 2016-10-19 (×2): via INTRAVENOUS

## 2016-10-19 MED ORDER — HYDROMORPHONE HCL 1 MG/ML IJ SOLN
1.0000 mg | INTRAMUSCULAR | Status: DC | PRN
  Administered 2016-10-19: 2 mg via INTRAVENOUS
  Administered 2016-10-19: 1 mg via INTRAVENOUS
  Administered 2016-10-19: 2 mg via INTRAVENOUS
  Administered 2016-10-19 – 2016-10-20 (×3): 1 mg via INTRAVENOUS
  Administered 2016-10-20: 2 mg via INTRAVENOUS
  Administered 2016-10-20 – 2016-10-21 (×6): 1 mg via INTRAVENOUS
  Filled 2016-10-19: qty 1
  Filled 2016-10-19: qty 2
  Filled 2016-10-19 (×3): qty 1
  Filled 2016-10-19 (×2): qty 2
  Filled 2016-10-19 (×6): qty 1
  Filled 2016-10-19: qty 2
  Filled 2016-10-19: qty 1

## 2016-10-19 MED ORDER — ROCURONIUM BROMIDE 50 MG/5ML IV SOLN
INTRAVENOUS | Status: AC
  Filled 2016-10-19: qty 1

## 2016-10-19 MED ORDER — LEVOTHYROXINE SODIUM 150 MCG PO TABS
150.0000 ug | ORAL_TABLET | Freq: Every day | ORAL | Status: DC
  Administered 2016-10-20 – 2016-10-22 (×3): 150 ug via ORAL
  Filled 2016-10-19: qty 1
  Filled 2016-10-19: qty 3
  Filled 2016-10-19 (×2): qty 1

## 2016-10-19 MED ORDER — ACETAMINOPHEN 10 MG/ML IV SOLN
INTRAVENOUS | Status: DC | PRN
  Administered 2016-10-19: 1000 mg via INTRAVENOUS

## 2016-10-19 MED ORDER — FENTANYL CITRATE (PF) 100 MCG/2ML IJ SOLN
INTRAMUSCULAR | Status: AC
  Filled 2016-10-19: qty 2

## 2016-10-19 MED ORDER — LABETALOL HCL 5 MG/ML IV SOLN
INTRAVENOUS | Status: AC
  Filled 2016-10-19: qty 4

## 2016-10-19 MED ORDER — CEFAZOLIN SODIUM-DEXTROSE 2-4 GM/100ML-% IV SOLN
2.0000 g | INTRAVENOUS | Status: AC
  Administered 2016-10-19: 2 g via INTRAVENOUS

## 2016-10-19 MED ORDER — CEFAZOLIN SODIUM-DEXTROSE 2-4 GM/100ML-% IV SOLN
INTRAVENOUS | Status: AC
  Filled 2016-10-19: qty 100

## 2016-10-19 MED ORDER — ACETAMINOPHEN 10 MG/ML IV SOLN
INTRAVENOUS | Status: AC
Start: 2016-10-19 — End: 2016-10-19
  Filled 2016-10-19: qty 100

## 2016-10-19 MED ORDER — FENTANYL CITRATE (PF) 100 MCG/2ML IJ SOLN
INTRAMUSCULAR | Status: AC
  Administered 2016-10-19: 25 ug via INTRAVENOUS
  Filled 2016-10-19: qty 2

## 2016-10-19 MED ORDER — DEXTROSE-NACL 5-0.45 % IV SOLN
INTRAVENOUS | Status: DC
  Administered 2016-10-19 – 2016-10-21 (×3): via INTRAVENOUS

## 2016-10-19 MED ORDER — CALCIUM CARBONATE ANTACID 500 MG PO CHEW: 1.0000 | CHEWABLE_TABLET | ORAL | Status: DC | PRN

## 2016-10-19 MED ORDER — FAMOTIDINE 20 MG PO TABS
ORAL_TABLET | ORAL | Status: AC
  Filled 2016-10-19: qty 1

## 2016-10-19 MED ORDER — HYDROMORPHONE HCL 1 MG/ML IJ SOLN
0.2500 mg | INTRAMUSCULAR | Status: DC | PRN
  Administered 2016-10-19 (×2): 0.5 mg via INTRAVENOUS

## 2016-10-19 MED ORDER — DEXAMETHASONE SODIUM PHOSPHATE 10 MG/ML IJ SOLN
INTRAMUSCULAR | Status: AC
  Filled 2016-10-19: qty 1

## 2016-10-19 MED ORDER — CEFAZOLIN SODIUM-DEXTROSE 1-4 GM/50ML-% IV SOLN
1.0000 g | Freq: Three times a day (TID) | INTRAVENOUS | Status: DC
  Administered 2016-10-19 – 2016-10-21 (×6): 1 g via INTRAVENOUS
  Filled 2016-10-19 (×8): qty 50

## 2016-10-19 MED ORDER — HYDROMORPHONE HCL 1 MG/ML IJ SOLN
INTRAMUSCULAR | Status: AC
  Administered 2016-10-19: 0.5 mg via INTRAVENOUS
  Filled 2016-10-19: qty 1

## 2016-10-19 MED ORDER — HYDROMORPHONE HCL 1 MG/ML IJ SOLN
INTRAMUSCULAR | Status: AC
  Filled 2016-10-19: qty 1

## 2016-10-19 MED ORDER — SODIUM CHLORIDE 0.9 % IJ SOLN
INTRAMUSCULAR | Status: AC
  Filled 2016-10-19: qty 10

## 2016-10-19 MED ORDER — ONDANSETRON 4 MG PO TBDP: 4.0000 mg | ORAL_TABLET | Freq: Four times a day (QID) | ORAL | Status: DC | PRN

## 2016-10-19 MED ORDER — NICOTINE 14 MG/24HR TD PT24
14.0000 mg | MEDICATED_PATCH | Freq: Every day | TRANSDERMAL | Status: DC
  Administered 2016-10-19: 14 mg via TRANSDERMAL
  Filled 2016-10-19: qty 1

## 2016-10-19 MED ORDER — SEVOFLURANE IN SOLN
RESPIRATORY_TRACT | Status: AC
  Filled 2016-10-19: qty 250

## 2016-10-19 MED ORDER — FAMOTIDINE 20 MG PO TABS
20.0000 mg | ORAL_TABLET | Freq: Once | ORAL | Status: AC
  Administered 2016-10-19: 20 mg via ORAL

## 2016-10-19 MED ORDER — LABETALOL HCL 5 MG/ML IV SOLN
INTRAVENOUS | Status: DC | PRN
  Administered 2016-10-19: 5 mg via INTRAVENOUS

## 2016-10-19 MED ORDER — SUGAMMADEX SODIUM 200 MG/2ML IV SOLN
INTRAVENOUS | Status: DC | PRN
  Administered 2016-10-19: 200 mg via INTRAVENOUS

## 2016-10-19 MED ORDER — ROCURONIUM BROMIDE 100 MG/10ML IV SOLN
INTRAVENOUS | Status: DC | PRN
  Administered 2016-10-19 (×2): 10 mg via INTRAVENOUS
  Administered 2016-10-19 (×2): 20 mg via INTRAVENOUS
  Administered 2016-10-19: 40 mg via INTRAVENOUS

## 2016-10-19 MED ORDER — MORPHINE SULFATE (PF) 2 MG/ML IV SOLN
2.0000 mg | INTRAVENOUS | Status: DC | PRN
  Filled 2016-10-19: qty 1

## 2016-10-19 MED ORDER — PROPOFOL 10 MG/ML IV BOLUS
INTRAVENOUS | Status: AC
Start: 2016-10-19 — End: 2016-10-19
  Filled 2016-10-19: qty 20

## 2016-10-19 MED ORDER — BUPIVACAINE HCL (PF) 0.5 % IJ SOLN
INTRAMUSCULAR | Status: AC
  Filled 2016-10-19: qty 30

## 2016-10-19 MED ORDER — LIDOCAINE HCL (CARDIAC) 20 MG/ML IV SOLN
INTRAVENOUS | Status: DC | PRN
  Administered 2016-10-19: 50 mg via INTRAVENOUS

## 2016-10-19 MED ORDER — MIDAZOLAM HCL 2 MG/2ML IJ SOLN
INTRAMUSCULAR | Status: AC
  Filled 2016-10-19: qty 2

## 2016-10-19 MED ORDER — FENTANYL CITRATE (PF) 100 MCG/2ML IJ SOLN
25.0000 ug | INTRAMUSCULAR | Status: DC | PRN
  Administered 2016-10-19: 50 ug via INTRAVENOUS
  Administered 2016-10-19 (×4): 25 ug via INTRAVENOUS

## 2016-10-19 MED ORDER — PANTOPRAZOLE SODIUM 40 MG IV SOLR
40.0000 mg | Freq: Every day | INTRAVENOUS | Status: DC
  Administered 2016-10-19 – 2016-10-21 (×3): 40 mg via INTRAVENOUS
  Filled 2016-10-19 (×3): qty 40

## 2016-10-19 MED ORDER — SUGAMMADEX SODIUM 200 MG/2ML IV SOLN
INTRAVENOUS | Status: AC
Start: 2016-10-19 — End: 2016-10-19
  Filled 2016-10-19: qty 2

## 2016-10-19 MED ORDER — OXYCODONE HCL 5 MG PO TABS
5.0000 mg | ORAL_TABLET | ORAL | Status: DC | PRN
  Administered 2016-10-19 – 2016-10-22 (×12): 10 mg via ORAL
  Filled 2016-10-19 (×12): qty 2

## 2016-10-19 MED ORDER — ONDANSETRON HCL 4 MG/2ML IJ SOLN: 4.0000 mg | Freq: Once | INTRAMUSCULAR | Status: DC | PRN

## 2016-10-19 MED ORDER — CHLORHEXIDINE GLUCONATE CLOTH 2 % EX PADS: 6.0000 | MEDICATED_PAD | Freq: Once | CUTANEOUS | Status: DC

## 2016-10-19 MED ORDER — MIDAZOLAM HCL 2 MG/2ML IJ SOLN
INTRAMUSCULAR | Status: DC | PRN
  Administered 2016-10-19: 2 mg via INTRAVENOUS

## 2016-10-19 MED ORDER — HYDRALAZINE HCL 20 MG/ML IJ SOLN
INTRAMUSCULAR | Status: AC
  Filled 2016-10-19: qty 1

## 2016-10-19 MED ORDER — ONDANSETRON HCL 4 MG/2ML IJ SOLN
4.0000 mg | Freq: Four times a day (QID) | INTRAMUSCULAR | Status: DC | PRN
  Administered 2016-10-19 – 2016-10-20 (×2): 4 mg via INTRAVENOUS
  Filled 2016-10-19 (×2): qty 2

## 2016-10-19 MED ORDER — FENTANYL CITRATE (PF) 100 MCG/2ML IJ SOLN
INTRAMUSCULAR | Status: DC | PRN
  Administered 2016-10-19 (×2): 100 ug via INTRAVENOUS
  Administered 2016-10-19 (×2): 50 ug via INTRAVENOUS

## 2016-10-19 MED ORDER — PROPOFOL 10 MG/ML IV BOLUS
INTRAVENOUS | Status: DC | PRN
  Administered 2016-10-19: 150 mg via INTRAVENOUS

## 2016-10-19 MED ORDER — HYDRALAZINE HCL 20 MG/ML IJ SOLN
INTRAMUSCULAR | Status: DC | PRN
  Administered 2016-10-19: 5 mg via INTRAVENOUS

## 2016-10-19 MED ORDER — HYDROMORPHONE HCL 1 MG/ML IJ SOLN
INTRAMUSCULAR | Status: DC | PRN
  Administered 2016-10-19: 0.5 mg via INTRAVENOUS

## 2016-10-19 MED ORDER — ONDANSETRON HCL 4 MG/2ML IJ SOLN
INTRAMUSCULAR | Status: DC | PRN
  Administered 2016-10-19: 4 mg via INTRAVENOUS

## 2016-10-19 MED ORDER — HYDROMORPHONE HCL 1 MG/ML IJ SOLN
0.2500 mg | INTRAMUSCULAR | Status: DC | PRN
  Administered 2016-10-19 (×4): 0.5 mg via INTRAVENOUS

## 2016-10-19 MED ORDER — ONDANSETRON HCL 4 MG/2ML IJ SOLN
INTRAMUSCULAR | Status: AC
  Filled 2016-10-19: qty 2

## 2016-10-19 SURGICAL SUPPLY — 41 items
BINDER ABDOMINAL 12 ML 46-62 (SOFTGOODS) ×2 IMPLANT
BLADE SURG 15 STRL SS SAFETY (BLADE) ×2 IMPLANT
BULB RESERV EVAC DRAIN JP 100C (MISCELLANEOUS) ×4 IMPLANT
CANISTER SUCT 1200ML W/VALVE (MISCELLANEOUS) ×2 IMPLANT
CHLORAPREP W/TINT 26ML (MISCELLANEOUS) ×2 IMPLANT
DECANTER SPIKE VIAL GLASS SM (MISCELLANEOUS) IMPLANT
DERMABOND ADVANCED (GAUZE/BANDAGES/DRESSINGS) ×1
DERMABOND ADVANCED .7 DNX12 (GAUZE/BANDAGES/DRESSINGS) ×1 IMPLANT
DRAIN CHANNEL JP 19F (MISCELLANEOUS) ×4 IMPLANT
DRAPE LAPAROTOMY 100X77 ABD (DRAPES) ×2 IMPLANT
ELECT BLADE 6.5 EXT (BLADE) ×2 IMPLANT
ELECT CAUTERY BLADE 6.4 (BLADE) ×2 IMPLANT
ELECT REM PT RETURN 9FT ADLT (ELECTROSURGICAL) ×2
ELECTRODE REM PT RTRN 9FT ADLT (ELECTROSURGICAL) ×1 IMPLANT
GLOVE BIO SURGEON STRL SZ7 (GLOVE) ×10 IMPLANT
GOWN STRL REUS W/ TWL LRG LVL3 (GOWN DISPOSABLE) ×3 IMPLANT
GOWN STRL REUS W/TWL LRG LVL3 (GOWN DISPOSABLE) ×3
KIT RM TURNOVER STRD PROC AR (KITS) ×2 IMPLANT
LABEL OR SOLS (LABEL) IMPLANT
MESH VENTRALIGHT ST 8X10 (Mesh General) ×2 IMPLANT
NDL SAFETY 22GX1.5 (NEEDLE) ×2 IMPLANT
NEEDLE HYPO 25X1 1.5 SAFETY (NEEDLE) IMPLANT
NS IRRIG 500ML POUR BTL (IV SOLUTION) ×2 IMPLANT
PACK BASIN MINOR ARMC (MISCELLANEOUS) ×2 IMPLANT
SPONGE LAP 18X18 5 PK (GAUZE/BANDAGES/DRESSINGS) ×4 IMPLANT
STAPLER SKIN PROX 35W (STAPLE) ×2 IMPLANT
STRIP CLOSURE SKIN 1/2X4 (GAUZE/BANDAGES/DRESSINGS) ×2 IMPLANT
SUT ETHILON 3-0 FS-10 30 BLK (SUTURE) ×2
SUT PROLENE 0 CT 1 30 (SUTURE) ×16 IMPLANT
SUT PROLENE 0 CT 2 (SUTURE) ×4 IMPLANT
SUT SILK 3-0 (SUTURE) ×2 IMPLANT
SUT VIC AB 0 CT1 36 (SUTURE) ×4 IMPLANT
SUT VIC AB 2-0 CT1 27 (SUTURE) ×1
SUT VIC AB 2-0 CT1 TAPERPNT 27 (SUTURE) ×1 IMPLANT
SUT VIC AB 3-0 SH 27 (SUTURE) ×1
SUT VIC AB 3-0 SH 27X BRD (SUTURE) ×1 IMPLANT
SUT VIC AB 4-0 FS2 27 (SUTURE) ×2 IMPLANT
SUT VICRYL+ 3-0 144IN (SUTURE) ×2 IMPLANT
SUTURE EHLN 3-0 FS-10 30 BLK (SUTURE) ×1 IMPLANT
SYR BULB IRRIG 60ML STRL (SYRINGE) ×2 IMPLANT
SYR CONTROL 10ML (SYRINGE) ×2 IMPLANT

## 2016-10-19 NOTE — Interval H&P Note (Signed)
History and Physical Interval Note:  10/19/2016 7:13 AM  Brandon Mullen  has presented today for surgery, with the diagnosis of INCISIONAL HERNIA  The various methods of treatment have been discussed with the patient and family. After consideration of risks, benefits and other options for treatment, the patient has consented to  Procedure(s): HERNIA REPAIR INCISIONAL WITH COMPONENT SEPERATION (N/A) INSERTION OF MESH (N/A) as a surgical intervention .  The patient's history has been reviewed, patient examined, no change in status, stable for surgery.  I have reviewed the patient's chart and labs.  Questions were answered to the patient's satisfaction.     Raenell Mensing G

## 2016-10-19 NOTE — Transfer of Care (Signed)
Immediate Anesthesia Transfer of Care Note  Patient: Brandon Mullen  Procedure(s) Performed: Procedure(s): HERNIA REPAIR INCISIONAL WITH COMPONENT SEPERATION (N/A) INSERTION OF MESH (N/A)  Patient Location: PACU  Anesthesia Type:General  Level of Consciousness: drowsy and patient cooperative  Airway & Oxygen Therapy: Patient Spontanous Breathing and Patient connected to face mask oxygen  Post-op Assessment: Report given to RN and Post -op Vital signs reviewed and stable  Post vital signs: Reviewed and stable  Last Vitals:  Vitals:   10/19/16 0607 10/19/16 1039  BP: 125/90 121/75  Pulse: 64 90  Resp: 16 (!) 95  Temp: 37.1 C   SpO2: 98% 96%    Last Pain:  Vitals:   10/19/16 0607  TempSrc: Oral         Complications: No apparent anesthesia complications

## 2016-10-19 NOTE — Progress Notes (Signed)
abd binder on  

## 2016-10-19 NOTE — Anesthesia Procedure Notes (Signed)
Procedure Name: Intubation Date/Time: 10/19/2016 7:39 AM Performed by: Jonna Clark Pre-anesthesia Checklist: Patient identified, Patient being monitored, Timeout performed, Emergency Drugs available and Suction available Patient Re-evaluated:Patient Re-evaluated prior to induction Oxygen Delivery Method: Circle system utilized Preoxygenation: Pre-oxygenation with 100% oxygen Induction Type: IV induction Ventilation: Mask ventilation without difficulty Laryngoscope Size: Mac and 3 Grade View: Grade I Tube type: Oral Tube size: 7.0 mm Number of attempts: 1 Placement Confirmation: ETT inserted through vocal cords under direct vision,  positive ETCO2 and breath sounds checked- equal and bilateral Secured at: 21 cm Tube secured with: Tape Dental Injury: Teeth and Oropharynx as per pre-operative assessment

## 2016-10-19 NOTE — H&P (View-Only) (Signed)
Patient ID: Brandon Mullen, male   DOB: April 07, 1975, 41 y.o.   MRN: [id number]  Chief Complaint  Patient presents with  . Follow-up    HPI Brandon Mullen is a 41 y.o. male here today to discuss results of CT scan done on 10/01/16 to further evaluate his abdominal hernia.  HPI  Past Medical History:  Diagnosis Date  . Alcohol abuse   . Hernia, umbilical   . Hypertension   . Hypothyroidism     Past Surgical History:  Procedure Laterality Date  . SPLENECTOMY, 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 47's     Family History  Problem Relation Age of Onset  . Hyperlipidemia Mother   . Hyperlipidemia Father     Social History Social History  Substance Use Topics  . Smoking status: Current Every Day Smoker    Packs/day: 1.00    Years: 20.00    Types: Cigarettes  . Smokeless tobacco: Current User    Types: Chew  . Alcohol use Yes     Comment: Quit 2015 -- April 9th , 2015    No Known Allergies  Current Outpatient Prescriptions  Medication Sig Dispense Refill  . levothyroxine (SYNTHROID, LEVOTHROID) 150 MCG tablet Take 1 tablet (150 mcg total) by mouth daily before breakfast. For low thyroid function 30 tablet 0   No current facility-administered medications for this visit.     Review of Systems Review of Systems  Constitutional: Negative.   Respiratory: Negative.   Cardiovascular: Negative.     Blood pressure 138/82, pulse 78, resp. rate 12, height 5\' 8"  (1.727 m), weight 192 lb (87.1 kg).  Physical Exam Physical Exam  Constitutional: He is oriented to person, place, and time. He appears well-developed and well-nourished.  Neurological: He is alert and oriented to person, place, and time.  Skin: Skin is warm and dry.  Psychiatric: He has a normal mood and affect.    Data Reviewed Prior note and CT scan reviewed.   Assessment   Incisional hernias - CT scan shows hernia with multiple midline defects with protruding fatty tissue, small amount of bowel in  in the umbilicus. 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 Pt is agreeable, would like to proceed with scheduling surgery.  Pt is a smoker. Re-emphacized the need for smoking cessation prior to surgery as it increases his risk for complications. Pt is willing and says he plans to start nicotine patches soon.  Pt has a history of alcohol abuse. Reports that he has had nothing to drink since April of 2017.   CT also showed a 5 mm subpleural nodule in the right lower lobe. This will require a 12 month follow-up CT of the chest. Patient advised    Plan     surgery to be scheduled as above   HPI, Physical Exam, Assessment and Plan have been scribed under the direction and in the presence of Mckinley Jewel, MD  Gaspar Cola, CMA  I have completed the exam and reviewed the above documentation for accuracy and completeness.  I agree with the above.  Haematologist has been used and any errors in dictation or transcription are unintentional.  Makyi Ledo G. Jamal Collin, M.D., F.A.C.S.  Junie Panning G 10/04/2016, 11:53 AM

## 2016-10-19 NOTE — Anesthesia Postprocedure Evaluation (Signed)
Anesthesia Post Note  Patient: Brandon Mullen  Procedure(s) Performed: Procedure(s) (LRB): HERNIA REPAIR INCISIONAL WITH COMPONENT SEPERATION (N/A) INSERTION OF MESH (N/A)  Patient location during evaluation: PACU Anesthesia Type: General Level of consciousness: awake and alert and oriented Pain management: pain level controlled Vital Signs Assessment: post-procedure vital signs reviewed and stable Respiratory status: spontaneous breathing Cardiovascular status: blood pressure returned to baseline Anesthetic complications: no     Last Vitals:  Vitals:   10/19/16 1151 10/19/16 1200  BP:  140/88  Pulse: 81 85  Resp: (!) 21 16  Temp:    SpO2: 95% 94%    Last Pain:  Vitals:   10/19/16 1200  TempSrc:   PainSc: 5                  Cariann Kinnamon

## 2016-10-19 NOTE — Interval H&P Note (Signed)
History and Physical Interval Note:  10/19/2016 7:13 AM  Brandon Mullen  has presented today for surgery, with the diagnosis of INCISIONAL HERNIA  The various methods of treatment have been discussed with the patient and family. After consideration of risks, benefits and other options for treatment, the patient has consented to  Procedure(s): HERNIA REPAIR INCISIONAL WITH COMPONENT SEPERATION (N/A) INSERTION OF MESH (N/A) as a surgical intervention .  The patient's history has been reviewed, patient examined, no change in status, stable for surgery.  I have reviewed the patient's chart and labs.  Questions were answered to the patient's satisfaction.     SANKAR,SEEPLAPUTHUR G

## 2016-10-19 NOTE — Op Note (Signed)
Preop diagnosis:incisional hernias  Post op diagnosis: same  Operation: repair of the multiple incisional hernias with component separation and insertion of mesh  Surgeon: s. Robinette Haines  Assistant: Arvilla Meres RN, FA   Anesthesia: Gen.  Complications: none  EBL: approximate 75 mL  Drains: two 19-gauge blake drains  Description: this 41 year old male was evaluated for a large ventral hernia near the umbilicus on exam was noted to have a row of small hernias up and down the previous upper vertical midline incision. CT scan was obtained and showed the presence ofof the multiple defects. Repair was planned with component separation. Patient was put to sleep with an endotracheal tube.Foley catheter was inserted and this was removed to the end of procedure. The abdomen was prepped and draped as le field and timeout performed. The previous skin scar was excised out extending from the upper epigastric region down to and around the umbilicus. Following this further dissection was carried out exposing a row of small defects with the 2 or 3 sizable herniation self for what appeared to be preperitoneal fat but also in some areas the bowel being adherent was pushing into the hernia especially in the umbilicus. With careful opened through the defects up and down. Loops of bowel that was somewhat adherent to this area was carefully dissected off on both sides. Some of the attenuated midline fascia was excised out. In order to obtain a clean midline fascial age subcutaneous tissue was dissected off on both sides for short distance until the fascia could be easily identified and followed to its normal midline. Following this around the retrorectus space was opened on both sides and extended up and down throughout the length of the incisio and out laterally towards the lateral end of the rectus. The upper and lower ends were also freed from the fascia. A 20 x 25 cm Ventralex ST mesh was then brought to the field.  After ensuring hemostasis the posterior sheath and the peritoneum were closed with a running suture of 0 Vicryl.the retrorect. This this space was irrigated with some saline and made sure it was dry. The mesh was then cupped cupped approximate length and measuring approximately 20 cm wide and approximately 15-20 cm of 15-18 cm up and down.With 0 Prolene stitchesthe upper lower and was tacked to the fascia in midline.3 lateral 0 proline stitches were placed bilaterally at the edge of the mesh and using needle passer the stitches were brought out through tiny stab incisions and tied down to the fascia.Reinforcement was felt to be adequate with no tension identified.the 2 drains were then placed and brought out through stab incision inferiorly in the right and left lower quadrant area Following this the midline fascia was closed with interrupted figure-of-eight stitches of 0 Prolene.The subcutaneous tissue was closed running 2-0 Vicryl.the drains were fastened the skin with nylon stitches. Skin was closed with the staples.drains were fastened to the skin with nylon stitches. A honeycomb dressing and Foley fours on the drain were placed. The small stab incision laterally were closed with Steri-Strips. An abdominal binder was then placed. Patient was subsequently extubated and returned recovery room stable condition

## 2016-10-19 NOTE — Anesthesia Preprocedure Evaluation (Signed)
Anesthesia Evaluation  Patient identified by MRN, date of birth, ID band Patient awake    Reviewed: Allergy & Precautions, NPO status , Patient's Chart, lab work & pertinent test results  Airway Mallampati: II  TM Distance: >3 FB     Dental  (+) Chipped   Pulmonary Current Smoker,    Pulmonary exam normal        Cardiovascular hypertension, Pt. on medications Normal cardiovascular exam     Neuro/Psych PSYCHIATRIC DISORDERS Anxiety Depression negative neurological ROS     GI/Hepatic GERD  ,(+)     substance abuse  alcohol use,   Endo/Other  Hypothyroidism   Renal/GU   negative genitourinary   Musculoskeletal  (+) Arthritis , Osteoarthritis,    Abdominal Normal abdominal exam  (+)   Peds negative pediatric ROS (+)  Hematology   Anesthesia Other Findings   Reproductive/Obstetrics                             Anesthesia Physical Anesthesia Plan  ASA: II  Anesthesia Plan: General   Post-op Pain Management:    Induction: Intravenous  PONV Risk Score and Plan:   Airway Management Planned: Oral ETT  Additional Equipment:   Intra-op Plan:   Post-operative Plan: Extubation in OR  Informed Consent: I have reviewed the patients History and Physical, chart, labs and discussed the procedure including the risks, benefits and alternatives for the proposed anesthesia with the patient or authorized representative who has indicated his/her understanding and acceptance.   Dental advisory given  Plan Discussed with: CRNA and Surgeon  Anesthesia Plan Comments:         Anesthesia Quick Evaluation

## 2016-10-19 NOTE — Progress Notes (Signed)
There are six areas of steri strips to abd  Three to left side and three to right side where sutures were pulled through

## 2016-10-19 NOTE — Anesthesia Post-op Follow-up Note (Signed)
Anesthesia QCDR form completed.        

## 2016-10-19 NOTE — Progress Notes (Signed)
Per Dr. Jamal Collin okay to place order for dilaudid 1-2mg  q 3 PRN for severe pain at request of patient. He stated he does not like the way morphine makes him feel. Per Dr. Jamal Collin discontinue order for morphine.

## 2016-10-19 NOTE — H&P (View-Only) (Signed)
Patient ID: [name], male   DOB: March 08, 1975, 41 y.o.   MRN: [id number]  Chief Complaint  Patient presents with  . Other    HPI Brandon Mullen is a 41 y.o. male here today for a evaluation of a umbilical hernia. Patient noticed this are over ten years ago. He states the area has got bigger in the past two year, some pain with activity.    HPI  Past Medical History:  Diagnosis Date  . Alcohol abuse   . Hernia, umbilical   . Hypertension   . Hypothyroidism     Past Surgical History:  Procedure Laterality Date  . SPLENECTOMY, 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 20's     Family History  Problem Relation Age of Onset  . Hyperlipidemia Mother   . Hyperlipidemia Father     Social History Social History  Substance Use Topics  . Smoking status: Current Every Day Smoker    Packs/day: 1.00    Years: 20.00    Types: Cigarettes  . Smokeless tobacco: Current User    Types: Chew  . Alcohol use Yes     Comment: Quit 2015 -- April 9th , 2015    No Known Allergies  Current Outpatient Prescriptions  Medication Sig Dispense Refill  . levothyroxine (SYNTHROID, LEVOTHROID) 150 MCG tablet Take 1 tablet (150 mcg total) by mouth daily before breakfast. For low thyroid function 30 tablet 0   No current facility-administered medications for this visit.     Review of Systems Review of Systems  Constitutional: Negative.   Respiratory: Negative.   Cardiovascular: Negative.     Blood pressure 124/82, pulse 74, resp. rate 12, height 5\' 7"  (1.702 m), weight 187 lb (84.8 kg).  Physical Exam Physical Exam  Constitutional: He is oriented to person, place, and time. He appears well-developed and well-nourished.  HENT:  Mouth/Throat: Oropharynx is clear and moist.  Eyes: Conjunctivae are normal. No scleral icterus.  Neck: Neck supple.  Cardiovascular: Normal rate, regular rhythm and normal heart sounds.   Pulmonary/Chest: Effort normal and breath sounds normal.  Abdominal: Soft. Bowel sounds  are normal. There is no tenderness. A hernia is present. Hernia confirmed positive in the ventral area.    Lymphadenopathy:    He has no cervical adenopathy.  Neurological: He is alert and oriented to person, place, and time.  Skin: Skin is warm and dry.  Psychiatric: His behavior is normal.    Data Reviewed   Assessment    Ventral hernia-multiple midline defects     Plan   Findings explained to pt. Need imaging to assess extent of hernial defects. Surgical repair likely will entail component separation.  Pt is agreeable. Schedule CT scan to evaluate extent of incisional hernia       HPI, Physical Exam, Assessment and Plan have been scribed under the direction and in the presence of Mckinley Jewel, MD Karie Fetch, RN I have completed the exam and reviewed the above documentation for accuracy and completeness.  I agree with the above.  Haematologist has been used and any errors in dictation or transcription are unintentional.  Seeplaputhur G. Jamal Collin, M.D., F.A.C.S.   Junie Panning G 09/29/2016, 2:31 PM

## 2016-10-20 ENCOUNTER — Encounter: Payer: Self-pay | Admitting: General Surgery

## 2016-10-20 DIAGNOSIS — K432 Incisional hernia without obstruction or gangrene: Secondary | ICD-10-CM | POA: Diagnosis not present

## 2016-10-20 LAB — CBC
HCT: 42.4 % (ref 40.0–52.0)
HCT: 40.1 % (ref 40.0–52.0)
Hemoglobin: 14.4 g/dL (ref 13.0–18.0)
Hemoglobin: 13.6 g/dL (ref 13.0–18.0)
MCH: 31.3 pg (ref 26.0–34.0)
MCH: 31.2 pg (ref 26.0–34.0)
MCHC: 34 g/dL (ref 32.0–36.0)
MCHC: 33.9 g/dL (ref 32.0–36.0)
MCV: 92.1 fL (ref 80.0–100.0)
MCV: 92.1 fL (ref 80.0–100.0)
Platelets: 277 10*3/uL (ref 150–440)
Platelets: 269 10*3/uL (ref 150–440)
RBC: 4.6 MIL/uL (ref 4.40–5.90)
RBC: 4.36 MIL/uL — ABNORMAL LOW (ref 4.40–5.90)
RDW: 15.1 % — ABNORMAL HIGH (ref 11.5–14.5)
RDW: 15.2 % — ABNORMAL HIGH (ref 11.5–14.5)
WBC: 22.2 10*3/uL — ABNORMAL HIGH (ref 3.8–10.6)
WBC: 23.2 10*3/uL — ABNORMAL HIGH (ref 3.8–10.6)

## 2016-10-20 LAB — HIV ANTIBODY (ROUTINE TESTING W REFLEX): HIV Screen 4th Generation wRfx: NONREACTIVE

## 2016-10-20 MED ORDER — HYDROMORPHONE HCL 1 MG/ML IJ SOLN
2.0000 mg | Freq: Once | INTRAMUSCULAR | Status: AC
  Administered 2016-10-20: 2 mg via INTRAVENOUS

## 2016-10-20 NOTE — Progress Notes (Signed)
Patient ID: Brandon Mullen, male   DOB: [birth year], 41 y.o.   MRN: [id number] Pt was doing well till about midnight last night- had a lot of sanguinous drainage from drains and later also some from lower end of incision. His VSS. Hgb is 14.  This am he seems more comfortable. Exam shows fullness along the anterior abdomen.  Likely has a bleeder in retro rectus space. Given he is hemodynamically stable, will observe for now. If bleeding persists will need exploration.  Pt advsed. Keep NPO, bed rest. Discussed with nursing also.

## 2016-10-20 NOTE — Progress Notes (Signed)
Notified MD of low BP 84/60 manual. No new orders received will continue to monitor.

## 2016-10-20 NOTE — Progress Notes (Signed)
Called Dr. Jamal Collin regarding patient's distended abdomen and increased bloody drainage on honeycomb dressing.  JP drains are increasing in output per hour with drain one full of blood clots.  Doctor ordered CBC stat. Given instructions to tighten abdominal binder.  Will continue to monitor JP output.  Phoebe Sharps N   10/20/2016  1:16 AM

## 2016-10-20 NOTE — Progress Notes (Signed)
Patient ID: Brandon Mullen, male   DOB: [birth year], 41 y.o.   MRN: [id number] Since this am he has had minimal drainage . BP was low at 80systolic once, now in 03E. Pt states he feels a lot better today. Appears bleeding in retro rectus space has decreased or stopped. Plan to keep pt on bed rest with BRP, liquids po til mn, reassess in am.

## 2016-10-21 ENCOUNTER — Encounter
Admission: RE | Disposition: A | Discharge status: Home / Self Care | Payer: Self-pay | Source: Ambulatory Visit | Attending: General Surgery

## 2016-10-21 DIAGNOSIS — K432 Incisional hernia without obstruction or gangrene: Secondary | ICD-10-CM | POA: Diagnosis not present

## 2016-10-21 LAB — CBC WITH DIFFERENTIAL/PLATELET
Basophils Absolute: 0 10*3/uL (ref 0–0.1)
Basophils Relative: 0 %
Eosinophils Absolute: 0 10*3/uL (ref 0–0.7)
Eosinophils Relative: 0 %
HCT: 31.8 % — ABNORMAL LOW (ref 40.0–52.0)
Hemoglobin: 11.1 g/dL — ABNORMAL LOW (ref 13.0–18.0)
Lymphocytes Relative: 15 %
Lymphs Abs: 2.8 10*3/uL (ref 1.0–3.6)
MCH: 32.1 pg (ref 26.0–34.0)
MCHC: 34.9 g/dL (ref 32.0–36.0)
MCV: 91.9 fL (ref 80.0–100.0)
Monocytes Absolute: 2.8 10*3/uL — ABNORMAL HIGH (ref 0.2–1.0)
Monocytes Relative: 15 %
Neutro Abs: 13.3 10*3/uL — ABNORMAL HIGH (ref 1.4–6.5)
Neutrophils Relative %: 70 %
Platelets: 251 10*3/uL (ref 150–440)
RBC: 3.46 MIL/uL — ABNORMAL LOW (ref 4.40–5.90)
RDW: 14.9 % — ABNORMAL HIGH (ref 11.5–14.5)
WBC: 18.9 10*3/uL — ABNORMAL HIGH (ref 3.8–10.6)

## 2016-10-21 SURGERY — APPENDECTOMY, LAPAROSCOPIC
Anesthesia: Choice

## 2016-10-21 MED ORDER — CEPHALEXIN 500 MG PO CAPS
500.0000 mg | ORAL_CAPSULE | Freq: Two times a day (BID) | ORAL | Status: DC
  Administered 2016-10-21 – 2016-10-22 (×3): 500 mg via ORAL
  Filled 2016-10-21 (×3): qty 1

## 2016-10-21 MED ORDER — MAGNESIUM HYDROXIDE 400 MG/5ML PO SUSP
30.0000 mL | Freq: Every evening | ORAL | Status: DC | PRN
  Filled 2016-10-21: qty 30

## 2016-10-21 SURGICAL SUPPLY — 36 items
APPLIER CLIP 5 13 M/L LIGAMAX5 (MISCELLANEOUS) ×2
BLADE CLIPPER SURG (BLADE) ×2 IMPLANT
CANISTER SUCT 1200ML W/VALVE (MISCELLANEOUS) ×2 IMPLANT
CHLORAPREP W/TINT 26ML (MISCELLANEOUS) ×2 IMPLANT
CLIP APPLIE 5 13 M/L LIGAMAX5 (MISCELLANEOUS) ×1 IMPLANT
CUTTER FLEX LINEAR 45M (STAPLE) ×2 IMPLANT
DERMABOND ADVANCED (GAUZE/BANDAGES/DRESSINGS) ×1
DERMABOND ADVANCED .7 DNX12 (GAUZE/BANDAGES/DRESSINGS) ×1 IMPLANT
ELECT CAUTERY BLADE 6.4 (BLADE) ×2 IMPLANT
ELECT REM PT RETURN 9FT ADLT (ELECTROSURGICAL) ×2
ELECTRODE REM PT RTRN 9FT ADLT (ELECTROSURGICAL) ×1 IMPLANT
GLOVE BIO SURGEON STRL SZ7 (GLOVE) ×2 IMPLANT
GOWN STRL REUS W/ TWL LRG LVL3 (GOWN DISPOSABLE) ×2 IMPLANT
GOWN STRL REUS W/TWL LRG LVL3 (GOWN DISPOSABLE) ×2
IRRIGATION STRYKERFLOW (MISCELLANEOUS) ×1 IMPLANT
IRRIGATOR STRYKERFLOW (MISCELLANEOUS) ×2
IV NS 1000ML (IV SOLUTION) ×1
IV NS 1000ML BAXH (IV SOLUTION) ×1 IMPLANT
NEEDLE HYPO 22GX1.5 SAFETY (NEEDLE) ×2 IMPLANT
NS IRRIG 500ML POUR BTL (IV SOLUTION) ×2 IMPLANT
PACK LAP CHOLECYSTECTOMY (MISCELLANEOUS) ×2 IMPLANT
PENCIL ELECTRO HAND CTR (MISCELLANEOUS) ×2 IMPLANT
POUCH SPECIMEN RETRIEVAL 10MM (ENDOMECHANICALS) ×2 IMPLANT
RELOAD 45 VASCULAR/THIN (ENDOMECHANICALS) ×2 IMPLANT
RELOAD STAPLE TA45 3.5 REG BLU (ENDOMECHANICALS) ×2 IMPLANT
SCALPEL HARMONIC ACE (MISCELLANEOUS) ×2 IMPLANT
SCISSORS METZENBAUM CVD 33 (INSTRUMENTS) ×2 IMPLANT
SLEEVE ENDOPATH XCEL 5M (ENDOMECHANICALS) ×2 IMPLANT
SPONGE LAP 18X18 5 PK (GAUZE/BANDAGES/DRESSINGS) ×2 IMPLANT
SUT MNCRL AB 4-0 PS2 18 (SUTURE) ×2 IMPLANT
SUT VICRYL 0 AB UR-6 (SUTURE) ×4 IMPLANT
SYR 20CC LL (SYRINGE) ×2 IMPLANT
TRAY FOLEY W/METER SILVER 16FR (SET/KITS/TRAYS/PACK) ×2 IMPLANT
TROCAR XCEL BLUNT TIP 100MML (ENDOMECHANICALS) ×2 IMPLANT
TROCAR XCEL NON-BLD 5MMX100MML (ENDOMECHANICALS) ×4 IMPLANT
TUBING INSUF HEATED (TUBING) ×2 IMPLANT

## 2016-10-21 NOTE — Care Management (Signed)
Blood pressure improved.  Diet advanced. Pain controlled with oral pain meds.  Has been on bedrest due to increase output in JP drain.   Hemoglobin 11.1

## 2016-10-22 DIAGNOSIS — K432 Incisional hernia without obstruction or gangrene: Secondary | ICD-10-CM | POA: Diagnosis not present

## 2016-10-22 LAB — CBC WITH DIFFERENTIAL/PLATELET
Basophils Absolute: 0.2 10*3/uL — ABNORMAL HIGH (ref 0–0.1)
Basophils Relative: 1 %
Eosinophils Absolute: 0.2 10*3/uL (ref 0–0.7)
Eosinophils Relative: 1 %
HCT: 29.5 % — ABNORMAL LOW (ref 40.0–52.0)
Hemoglobin: 10.2 g/dL — ABNORMAL LOW (ref 13.0–18.0)
Lymphocytes Relative: 15 %
Lymphs Abs: 2.5 10*3/uL (ref 1.0–3.6)
MCH: 31.8 pg (ref 26.0–34.0)
MCHC: 34.5 g/dL (ref 32.0–36.0)
MCV: 92.1 fL (ref 80.0–100.0)
Monocytes Absolute: 2.7 10*3/uL — ABNORMAL HIGH (ref 0.2–1.0)
Monocytes Relative: 16 %
Neutro Abs: 11 10*3/uL — ABNORMAL HIGH (ref 1.4–6.5)
Neutrophils Relative %: 67 %
Platelets: 258 10*3/uL (ref 150–440)
RBC: 3.2 MIL/uL — ABNORMAL LOW (ref 4.40–5.90)
RDW: 14.4 % (ref 11.5–14.5)
WBC: 16.6 10*3/uL — ABNORMAL HIGH (ref 3.8–10.6)

## 2016-10-22 MED ORDER — CEPHALEXIN 500 MG PO CAPS: 500.0000 mg | ORAL_CAPSULE | Freq: Two times a day (BID) | ORAL | 0 refills | Status: DC

## 2016-10-22 MED ORDER — SODIUM CHLORIDE 0.9% FLUSH: 10.0000 mL | Freq: Three times a day (TID) | INTRAVENOUS | Status: DC

## 2016-10-22 MED ORDER — OXYCODONE HCL 5 MG PO TABS: 5.0000 mg | ORAL_TABLET | ORAL | 0 refills | Status: DC | PRN

## 2016-10-22 MED ORDER — SODIUM CHLORIDE 0.9% FLUSH: 3.0000 mL | Freq: Two times a day (BID) | INTRAVENOUS | Status: DC

## 2016-10-22 NOTE — Care Management (Signed)
will discharge with jp drain and patient will be taught how to empty.  No discharge needs identified by members of the care team

## 2016-10-22 NOTE — Discharge Summary (Signed)
Physician Discharge Summary  Patient ID: Brandon Mullen MRN: [id number] DOB/AGE: February 14, 1976 41 y.o.  Admit date: 10/19/2016 Discharge date: 10/22/2016  Admission Diagnoses:multiple incisional hernias abdominal wall  Discharge Diagnoses:  Active Problems:   Incisional hernia of anterior abdominal wall without obstruction or gangrene   Discharged Condition: good  Hospital Course: this 41 year old male presented with the complaint of hernias in this anterior abdomen. He had had a previous splenectomy many years ago. Exam and CT imaging showed multiple defects in the anterior abdominal wall extending from the mid epigastric region down to the umbilicus. Repair was planned with the component separation. Patient underwent this procedure on 10/19/2016-mesh was placed in the retrorectus space. This space was drained with 2 Blake drains. Patient was stable on the day of surgery up until midnight when he started to have some increasing sanguinous drainage from the drains. This amounted to about 500 mL to 6 mL. In view of this the patient was kept in hospital for the next couple of days to ensure there was no further bleeding. The second day the drainage had decreased significantly and he remained hemodynamically stable. His hemoglobin has drifted down to about 10 but he has no evidence of active bleeding at this time. White count was elevated at 22,000 but is drifted down.In the last 24 hours the patient has been much more comfortable just with oral pain medicines.He is tolerating a regular diet now and has no nausea or vomiting. He has not had a bowel movement but he is starting to pass gas at this time. The drainage has been ranging from about the 50-90 mL total per day. Incision is clean and intact. Patient is now being discharged and will be followed as an outpatient. He has been instructed to empty the drain 2-3 times a day and record the amount. Consults: None  Significant Diagnostic Studies:  none  Treatments: surgery: repair of the incisional hernias with the component separation and placement of mesh  Discharge Exam: Blood pressure (!) 146/83, pulse (!) 103, temperature 98.4 F (36.9 C), resp. rate 20, height 5\' 8"  (1.727 m), weight 192 lb 6.4 oz (87.3 kg), SpO2 96 %. vital signs are stable patient appears much more comfortable and is ambulating well. Abdomen is mildly distended but soft  with good bowel sounds. Incision is clean and intact as also the drains  Disposition: 01-Home or Self Care  Discharge Instructions    Call MD for:  persistant nausea and vomiting    Complete by:  As directed    Call MD for:  redness, tenderness, or signs of infection (pain, swelling, redness, odor or green/yellow discharge around incision site)    Complete by:  As directed    Call MD for:  severe uncontrolled pain    Complete by:  As directed    Call MD for:  temperature >100.4    Complete by:  As directed    Diet - low sodium heart healthy    Complete by:  As directed    Discharge instructions    Complete by:  As directed    Emptied drained 2-3 times a day and record amount. No exertional activity Sink Bath only Use MiraLAX daily if needed. May also use Dulcolax tablets or milk of magnesia when necessary   Discharge wound care:    Complete by:  As directed    Dry gauze dressing around the drain may change every day or every other day. If main incision is dry no dressings required  Allergies as of 10/22/2016   No Known Allergies     Medication List    TAKE these medications   calcium carbonate 500 MG chewable tablet Commonly known as:  TUMS - dosed in mg elemental calcium Chew 1 tablet by mouth as needed for indigestion or heartburn.   cephALEXin 500 MG capsule Commonly known as:  KEFLEX Take 1 capsule (500 mg total) by mouth every 12 (twelve) hours.   ibuprofen 200 MG tablet Commonly known as:  ADVIL,MOTRIN Take 800 mg by mouth 2 (two) times daily as needed for  moderate pain.   levothyroxine 150 MCG tablet Commonly known as:  SYNTHROID, LEVOTHROID Take 1 tablet (150 mcg total) by mouth daily before breakfast. For low thyroid function   oxyCODONE 5 MG immediate release tablet Commonly known as:  Oxy IR/ROXICODONE Take 1-2 tablets (5-10 mg total) by mouth every 4 (four) hours as needed for moderate pain.            Discharge Care Instructions        Start     Ordered   10/22/16 0000  cephALEXin (KEFLEX) 500 MG capsule  Every 12 hours     10/22/16 0948   10/22/16 0000  oxyCODONE (OXY IR/ROXICODONE) 5 MG immediate release tablet  Every 4 hours PRN     10/22/16 0948   10/22/16 0000  Diet - low sodium heart healthy     10/22/16 0948   10/22/16 0000  Discharge instructions    Comments:  Emptied drained 2-3 times a day and record amount. No exertional activity Sink Bath only Use MiraLAX daily if needed. May also use Dulcolax tablets or milk of magnesia when necessary   10/22/16 0948   10/22/16 0000  Discharge wound care:    Comments:  Dry gauze dressing around the drain may change every day or every other day. If main incision is dry no dressings required   10/22/16 0948   10/22/16 0000  Call MD for:  temperature >100.4     10/22/16 0948   10/22/16 0000  Call MD for:  persistant nausea and vomiting     10/22/16 0948   10/22/16 0000  Call MD for:  severe uncontrolled pain     10/22/16 0948   10/22/16 0000  Call MD for:  redness, tenderness, or signs of infection (pain, swelling, redness, odor or green/yellow discharge around incision site)     10/22/16 0948     Follow-up Information    Christene Lye, MD Follow up in 1 week(s).   Specialties:  General Surgery, Radiology Contact information: [address] [place] [address] [phone]           Signed: Christene Lye 10/22/2016, 9:49 AM

## 2016-10-22 NOTE — Progress Notes (Signed)
MD ordered patient to be discharged home.  Discharge instructions were reviewed with the patient and he voiced understanding.  Follow-up appointment was made.  Prescription given to the patient.  Dressing to the abdominal incision was changed.  Patient instructed on how to empty the JP drains and dressing change if needed.  Dressing supplies and recording sheet were given to the patient.  IVs were removed with catheter intact.  All patients questions were answered.  Patient left via wheelchair escorted by auxillary.

## 2016-10-25 ENCOUNTER — Telehealth: Payer: Self-pay

## 2016-10-25 ENCOUNTER — Ambulatory Visit (INDEPENDENT_AMBULATORY_CARE_PROVIDER_SITE_OTHER): Payer: BLUE CROSS/BLUE SHIELD | Admitting: General Surgery

## 2016-10-25 ENCOUNTER — Encounter: Payer: Self-pay | Admitting: General Surgery

## 2016-10-25 VITALS — BP 118/86 | HR 74 | Resp 12 | Ht 68.0 in | Wt 178.0 lb

## 2016-10-25 DIAGNOSIS — K432 Incisional hernia without obstruction or gangrene: Secondary | ICD-10-CM

## 2016-10-25 NOTE — Telephone Encounter (Signed)
Receive call from patient's mother and dressing was changed today and patient no has active bleeding from navel. She states that blood is dripping on the floor.  Spoke with Dr Jamal Collin and he wants to see the patient in office today. The patient is amendable to this.

## 2016-10-25 NOTE — Patient Instructions (Signed)
Follow up as scheduled on Thursday
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
## 2016-10-26 ENCOUNTER — Other Ambulatory Visit: Payer: Self-pay | Admitting: *Deleted

## 2016-10-26 MED ORDER — CEPHALEXIN 500 MG PO CAPS: 500.0000 mg | ORAL_CAPSULE | Freq: Two times a day (BID) | ORAL | 0 refills | Status: DC

## 2016-10-26 NOTE — Telephone Encounter (Signed)
Keflex refilled, sent to pharmacy

## 2016-10-28 ENCOUNTER — Encounter: Payer: Self-pay | Admitting: General Surgery

## 2016-10-28 ENCOUNTER — Ambulatory Visit (INDEPENDENT_AMBULATORY_CARE_PROVIDER_SITE_OTHER): Payer: BLUE CROSS/BLUE SHIELD | Admitting: General Surgery

## 2016-10-28 VITALS — BP 142/74 | HR 88 | Resp 14 | Ht 69.0 in | Wt 188.0 lb

## 2016-10-28 DIAGNOSIS — K432 Incisional hernia without obstruction or gangrene: Secondary | ICD-10-CM
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
## 2016-10-28 NOTE — Patient Instructions (Signed)
Return on Tuesday,

## 2016-10-29 ENCOUNTER — Telehealth: Payer: Self-pay

## 2016-10-29 NOTE — Telephone Encounter (Signed)
Patient called and states that he is almost out of his narcotic pain medication. The patient was seen here yesterday and states that he did not realize that he had so few left and is worried about going thru the weekend and running out.  I spoke with Dr Jamal Collin and notified him of what the patient said and he gave orders to call in Tramadol 50 mg, 1 by mouth every 6 hours as needed for pain, # 20 tablets.  The patient is aware and his prescription has been called into [facility] in [place] on [address].

## 2016-11-02 ENCOUNTER — Encounter: Payer: Self-pay | Admitting: General Surgery

## 2016-11-02 ENCOUNTER — Ambulatory Visit: Payer: BLUE CROSS/BLUE SHIELD | Admitting: General Surgery

## 2016-11-02 ENCOUNTER — Ambulatory Visit (INDEPENDENT_AMBULATORY_CARE_PROVIDER_SITE_OTHER): Payer: BLUE CROSS/BLUE SHIELD | Admitting: General Surgery

## 2016-11-02 VITALS — BP 142/78 | HR 114 | Resp 12 | Ht 68.0 in | Wt 188.0 lb

## 2016-11-02 DIAGNOSIS — K432 Incisional hernia without obstruction or gangrene: Secondary | ICD-10-CM

## 2016-11-02 NOTE — Patient Instructions (Signed)
Patient to return in two days for drain removal and staples.
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
## 2016-11-04 ENCOUNTER — Encounter: Payer: Self-pay | Admitting: General Surgery

## 2016-11-04 ENCOUNTER — Ambulatory Visit (INDEPENDENT_AMBULATORY_CARE_PROVIDER_SITE_OTHER): Payer: BLUE CROSS/BLUE SHIELD | Admitting: General Surgery

## 2016-11-04 VITALS — BP 118/78 | HR 92 | Resp 12 | Ht 68.0 in | Wt 190.0 lb

## 2016-11-04 DIAGNOSIS — K432 Incisional hernia without obstruction or gangrene: Secondary | ICD-10-CM

## 2016-11-04 NOTE — Patient Instructions (Signed)
Follow up on Monday Keep wearing binder and recording drainage output.
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
## 2016-11-08 ENCOUNTER — Encounter: Payer: Self-pay | Admitting: General Surgery

## 2016-11-08 ENCOUNTER — Ambulatory Visit (INDEPENDENT_AMBULATORY_CARE_PROVIDER_SITE_OTHER): Payer: BLUE CROSS/BLUE SHIELD | Admitting: General Surgery

## 2016-11-08 VITALS — BP 124/80 | HR 70 | Resp 12 | Ht 68.0 in | Wt 190.0 lb

## 2016-11-08 DIAGNOSIS — K432 Incisional hernia without obstruction or gangrene: Secondary | ICD-10-CM
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
## 2016-11-08 NOTE — Patient Instructions (Signed)
Return in two weeks.  

## 2016-11-18 ENCOUNTER — Encounter: Payer: Self-pay | Admitting: General Surgery

## 2016-11-18 ENCOUNTER — Ambulatory Visit (INDEPENDENT_AMBULATORY_CARE_PROVIDER_SITE_OTHER): Payer: BLUE CROSS/BLUE SHIELD | Admitting: General Surgery

## 2016-11-18 VITALS — BP 128/78 | HR 82 | Ht 68.0 in | Wt 195.0 lb

## 2016-11-18 DIAGNOSIS — K432 Incisional hernia without obstruction or gangrene: Secondary | ICD-10-CM
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
## 2016-11-18 NOTE — Patient Instructions (Addendum)
Patient can resume work with light duty for the first week. Return to clinic if needed

## 2016-11-22 ENCOUNTER — Ambulatory Visit: Payer: BLUE CROSS/BLUE SHIELD | Admitting: General Surgery

## 2016-11-29 ENCOUNTER — Encounter: Payer: Self-pay | Admitting: General Surgery

## 2016-12-30 ENCOUNTER — Encounter: Payer: Self-pay | Admitting: General Surgery

## 2016-12-30 ENCOUNTER — Ambulatory Visit (INDEPENDENT_AMBULATORY_CARE_PROVIDER_SITE_OTHER): Payer: BLUE CROSS/BLUE SHIELD | Admitting: General Surgery

## 2016-12-30 ENCOUNTER — Ambulatory Visit: Payer: BLUE CROSS/BLUE SHIELD | Admitting: General Surgery

## 2016-12-30 VITALS — BP 134/84 | HR 71 | Resp 11 | Ht 67.0 in | Wt 199.0 lb

## 2016-12-30 DIAGNOSIS — K432 Incisional hernia without obstruction or gangrene: Secondary | ICD-10-CM

## 2016-12-30 NOTE — Patient Instructions (Signed)
Return as needed
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
## 2017-04-01 ENCOUNTER — Ambulatory Visit: Payer: Self-pay | Admitting: General Surgery

## 2017-04-01 NOTE — H&P (Signed)
PATIENT PROFILE: Brandon Mullen is a 42 y.o. male who presents to the Clinic for consultation at the request of Pointer, NP for evaluation of scalp mass.  PCP:  [facility], [facility], MD  HISTORY OF PRESENT ILLNESS: Mr. Botero reports having a scalp cyst on the back of his head since many years ago. The patient refers that in the last year it has been increasing significantly and is causing some problems like discomfort and mild pain. Patient refers pain does not radiates to other part of the body. Pain gets worse with pressure like lying down on a pillow or wearing a cap. Patient refers that removing the pressure improves the pain. Denies episodes of previous infection. Also having problem when cutting his hair that hurst the mass and bleeds sometimes.   PROBLEM LIST: Hypothyroidism  GENERAL REVIEW OF SYSTEMS:   General ROS: negative for - chills, fatigue, fever, weight gain or weight loss Allergy and Immunology ROS: negative for - hives  Hematological and Lymphatic ROS: negative for - bleeding problems or bruising, negative for palpable nodes Endocrine ROS: negative for - heat or cold intolerance, hair changes Respiratory ROS: negative for - cough, shortness of breath or wheezing Cardiovascular ROS: no chest pain or palpitations GI ROS: negative for nausea, vomiting, abdominal pain, diarrhea, constipation Musculoskeletal ROS: negative for - joint swelling or muscle pain Neurological ROS: negative for - confusion, syncope Dermatological ROS: negative for pruritus and rash Psychiatric: negative for anxiety, depression, difficulty sleeping and memory loss  MEDICATIONS: CurrentMedications        Current Outpatient Medications  Medication Sig Dispense Refill  . levothyroxine (SYNTHROID, LEVOTHROID) 150 MCG tablet TAKE 1 TABLET BY MOUTH DAILY FOR LOW THYROID  2   No current facility-administered medications for this visit.       ALLERGIES: Patient has no known  allergies.  PAST MEDICAL HISTORY: Hypothyroidism  PAST SURGICAL HISTORY: Splenectomy Insicional hernia repair 2018  FAMILY HISTORY: History reviewed. No pertinent family history.   SOCIAL HISTORY: Social History        Socioeconomic History  . Marital status: Single    Spouse name: Not on file  . Number of children: Not on file  . Years of education: Not on file  . Highest education level: Not on file  Social Needs  . Financial resource strain: Not on file  . Food insecurity - worry: Not on file  . Food insecurity - inability: Not on file  . Transportation needs - medical: Not on file  . Transportation needs - non-medical: Not on file  Occupational History  . Not on file  Tobacco Use  . Smoking status: Current Every Day Smoker    Packs/day: 1.00    Types: Cigarettes  . Smokeless tobacco: Never Used  Substance and Sexual Activity  . Alcohol use: Not Currently  . Drug use: Never  . Sexual activity: Not on file  Other Topics Concern  . Not on file  Social History Narrative  . Not on file    PHYSICAL EXAM:    Vitals:   04/01/17 1029  BP: 124/80  Pulse: 72  Temp: 36.5 C (97.7 F)   Body mass index is 30.85 kg/m. Weight: 89.4 kg (197 lb)   GENERAL: Alert, active, oriented x3  HEENT: Multiple scalp masses. The largest on the posterior scalp ~5cm of diameter, movable, soft, mild tender to pressure. On the left scalp there is a 2cm mass, movable, soft. On the right scalp there is a skin lesion ~3cm  with a small base that. On the anterior right scalp there is another skin lesion around 1cm. No erythema on any, no sign of infection. Pupils equal reactive to light. Extraocular movements are intact. Sclera clear. Palpebral conjunctiva normal red color.Pharynx clear.  NECK: Supple with no palpable mass and no adenopathy.  LUNGS: Sound clear with no rales rhonchi or wheezes.  HEART: Regular rhythm S1 and S2 without murmur.  ABDOMEN: Soft and  depressible, nontender with no palpable mass, no hepatomegaly. Wounds dry and clean.  EXTREMITIES: Well-developed well-nourished symmetrical with no dependent edema.  NEUROLOGICAL: Awake alert oriented, facial expression symmetrical, moving all extremities.  REVIEW OF DATA: I have reviewed the following data today: No results found for any previous visit.   No labs to be reviewed.   ASSESSMENT: Mr. Agcaoili is a 42 y.o. male presenting for consultation for multiple scalp masses. Patient was oriented about the masses and the excision procedure. The biggest mass is on the posterior scalp. Patient was oriented that the scalp is an area of significant circulation and tends to bleed during surgery. For that reason, the multiple masses and their sized, the procedure needs to be done in the OR. I told him that I will start taking the biggest one and continue taking the ones that bothers him more first with the goal of excising all of them. Procedure details, benefits and risks were discussed with patient and he agreed to proceed. His PCP Laneta Simmers NP will be notified of this findings and plan of care.   PLAN: 1. Excision of multiple scalp masses on the Operation room due to size and risk of bleeding on the office 2. Follow up staff instructions regarding pre admission process 3. CBC, CMP  Patient verbalized understanding, all questions were answered, and were agreeable with the plan outlined above.   Herbert Pun, MD  Electronically signed by Herbert Pun, MD

## 2017-04-01 NOTE — H&P (View-Only) (Signed)
PATIENT PROFILE: Brandon Mullen is a 42 y.o. male who presents to the Clinic for consultation at the request of Pointer, NP for evaluation of scalp mass.  PCP:  Center, [facility], MD  HISTORY OF PRESENT ILLNESS: Brandon Mullen reports having a scalp cyst on the back of his head since many years ago. The patient refers that in the last year it has been increasing significantly and is causing some problems like discomfort and mild pain. Patient refers pain does not radiates to other part of the body. Pain gets worse with pressure like lying down on a pillow or wearing a cap. Patient refers that removing the pressure improves the pain. Denies episodes of previous infection. Also having problem when cutting his hair that hurst the mass and bleeds sometimes.   PROBLEM LIST: Hypothyroidism  GENERAL REVIEW OF SYSTEMS:   General ROS: negative for - chills, fatigue, fever, weight gain or weight loss Allergy and Immunology ROS: negative for - hives  Hematological and Lymphatic ROS: negative for - bleeding problems or bruising, negative for palpable nodes Endocrine ROS: negative for - heat or cold intolerance, hair changes Respiratory ROS: negative for - cough, shortness of breath or wheezing Cardiovascular ROS: no chest pain or palpitations GI ROS: negative for nausea, vomiting, abdominal pain, diarrhea, constipation Musculoskeletal ROS: negative for - joint swelling or muscle pain Neurological ROS: negative for - confusion, syncope Dermatological ROS: negative for pruritus and rash Psychiatric: negative for anxiety, depression, difficulty sleeping and memory loss  MEDICATIONS: CurrentMedications        Current Outpatient Medications  Medication Sig Dispense Refill  . levothyroxine (SYNTHROID, LEVOTHROID) 150 MCG tablet TAKE 1 TABLET BY MOUTH DAILY FOR LOW THYROID  2   No current facility-administered medications for this visit.       ALLERGIES: Patient has no known  allergies.  PAST MEDICAL HISTORY: Hypothyroidism  PAST SURGICAL HISTORY: Splenectomy Insicional hernia repair 2018  FAMILY HISTORY: History reviewed. No pertinent family history.   SOCIAL HISTORY: Social History        Socioeconomic History  . Marital status: Single    Spouse name: Not on file  . Number of children: Not on file  . Years of education: Not on file  . Highest education level: Not on file  Social Needs  . Financial resource strain: Not on file  . Food insecurity - worry: Not on file  . Food insecurity - inability: Not on file  . Transportation needs - medical: Not on file  . Transportation needs - non-medical: Not on file  Occupational History  . Not on file  Tobacco Use  . Smoking status: Current Every Day Smoker    Packs/day: 1.00    Types: Cigarettes  . Smokeless tobacco: Never Used  Substance and Sexual Activity  . Alcohol use: Not Currently  . Drug use: Never  . Sexual activity: Not on file  Other Topics Concern  . Not on file  Social History Narrative  . Not on file    PHYSICAL EXAM:    Vitals:   04/01/17 1029  BP: 124/80  Pulse: 72  Temp: 36.5 C (97.7 F)   Body mass index is 30.85 kg/m. Weight: 89.4 kg (197 lb)   GENERAL: Alert, active, oriented x3  HEENT: Multiple scalp masses. The largest on the posterior scalp ~5cm of diameter, movable, soft, mild tender to pressure. On the left scalp there is a 2cm mass, movable, soft. On the right scalp there is a skin lesion ~3cm  with a small base that. On the anterior right scalp there is another skin lesion around 1cm. No erythema on any, no sign of infection. Pupils equal reactive to light. Extraocular movements are intact. Sclera clear. Palpebral conjunctiva normal red color.Pharynx clear.  NECK: Supple with no palpable mass and no adenopathy.  LUNGS: Sound clear with no rales rhonchi or wheezes.  HEART: Regular rhythm S1 and S2 without murmur.  ABDOMEN: Soft and  depressible, nontender with no palpable mass, no hepatomegaly. Wounds dry and clean.  EXTREMITIES: Well-developed well-nourished symmetrical with no dependent edema.  NEUROLOGICAL: Awake alert oriented, facial expression symmetrical, moving all extremities.  REVIEW OF DATA: I have reviewed the following data today: No results found for any previous visit.   No labs to be reviewed.   ASSESSMENT: Brandon Mullen is a 42 y.o. male presenting for consultation for multiple scalp masses. Patient was oriented about the masses and the excision procedure. The biggest mass is on the posterior scalp. Patient was oriented that the scalp is an area of significant circulation and tends to bleed during surgery. For that reason, the multiple masses and their sized, the procedure needs to be done in the OR. I told him that I will start taking the biggest one and continue taking the ones that bothers him more first with the goal of excising all of them. Procedure details, benefits and risks were discussed with patient and he agreed to proceed. His PCP Laneta Simmers NP will be notified of this findings and plan of care.   PLAN: 1. Excision of multiple scalp masses on the Operation room due to size and risk of bleeding on the office 2. Follow up staff instructions regarding pre admission process 3. CBC, CMP  Patient verbalized understanding, all questions were answered, and were agreeable with the plan outlined above.   Herbert Pun, MD  Electronically signed by Herbert Pun, MD

## 2017-04-04 ENCOUNTER — Other Ambulatory Visit: Payer: Self-pay

## 2017-04-04 ENCOUNTER — Encounter
Admission: RE | Admit: 2017-04-04 | Discharge: 2017-04-04 | Disposition: A | Discharge status: Home / Self Care | Payer: BLUE CROSS/BLUE SHIELD | Source: Ambulatory Visit | Attending: General Surgery | Admitting: General Surgery

## 2017-04-04 NOTE — Patient Instructions (Addendum)
  Your procedure is scheduled on: 04-06-17  [facility] Report to Same Day Surgery 2nd floor medical mall [facility] Entrance-take elevator on left to 2nd floor.  Check in with surgery information desk.) To find out your arrival time please call [phone] between 1PM - 3PM on 04-05-17 TUESDAY  Remember: Instructions that are not followed completely may result in serious medical risk, up to and including death, or upon the discretion of your surgeon and anesthesiologist your surgery may need to be rescheduled.    _x___ 1. Do not eat food after midnight the night before your procedure. NO GUM OR CANDY AFTER MIDNIGHT.  You may drink clear liquids up to 2 hours before you are scheduled to arrive at the hospital for your procedure.  Do not drink clear liquids within 2 hours of your scheduled arrival to the hospital.  Clear liquids include  --Water or Apple juice without pulp  --Clear carbohydrate beverage such as ClearFast or Gatorade  --Black Coffee or Clear Tea (No milk, no creamers, do not add anything to the coffee or Tea      __x__ 2. No Alcohol for 24 hours before or after surgery.   __x__3. No Smoking for 24 prior to surgery.   ____  4. Bring all medications with you on the day of surgery if instructed.    __x__ 5. Notify your doctor if there is any change in your medical condition     (cold, fever, infections).     Do not wear jewelry, make-up, hairpins, clips or nail polish.  Do not wear lotions, powders, or perfumes. You may wear deodorant.  Do not shave 48 hours prior to surgery. Men may shave face and neck.  Do not bring valuables to the hospital.    [facility] is not responsible for any belongings or valuables.               Contacts, dentures or bridgework may not be worn into surgery.  Leave your suitcase in the car. After surgery it may be brought to your room.  For patients admitted to the hospital, discharge time is determined by your treatment team.   Patients  discharged the day of surgery will not be allowed to drive home.  You will need someone to drive you home and stay with you the night of your procedure.    Please read over the following fact sheets that you were given:   [facility] Preparing for Surgery and or MRSA Information   _x___ TAKE THE FOLLOWING MEDICATION THE MORNING OF SURGERY WITH A SMALL SIP OF WATER. These include:  1. LEVOTHYROXINE  2.  3.  4.  5.  6.  ____Fleets enema or Magnesium Citrate as directed.   ____ Use CHG Soap or sage wipes as directed on instruction sheet   ____ Use inhalers on the day of surgery and bring to hospital day of surgery  ____ Stop Metformin and Janumet 2 days prior to surgery.    ____ Take 1/2 of usual insulin dose the night before surgery and none on the morning surgery.   ____ Follow recommendations from Cardiologist, Pulmonologist or PCP regarding stopping Aspirin, Coumadin, Plavix ,Eliquis, Effient, or Pradaxa, and Pletal.  X____Stop Anti-inflammatories such as Advil, Aleve, IBUPROFEN, Motrin, Naproxen, Naprosyn, Goodies powders or aspirin products NOW-OK to take Tylenol   ____ Stop supplements until after surgery.     ____ Bring C-Pap to the hospital.

## 2017-04-05 MED ORDER — CEFAZOLIN SODIUM-DEXTROSE 2-4 GM/100ML-% IV SOLN
2.0000 g | INTRAVENOUS | Status: AC
  Administered 2017-04-06: 2 g via INTRAVENOUS

## 2017-04-06 ENCOUNTER — Encounter
Admission: RE | Disposition: A | Discharge status: Home / Self Care | Payer: Self-pay | Source: Ambulatory Visit | Attending: General Surgery

## 2017-04-06 ENCOUNTER — Ambulatory Visit: Payer: BLUE CROSS/BLUE SHIELD | Admitting: Registered Nurse

## 2017-04-06 ENCOUNTER — Other Ambulatory Visit: Payer: Self-pay

## 2017-04-06 ENCOUNTER — Encounter: Payer: Self-pay | Admitting: *Deleted

## 2017-04-06 ENCOUNTER — Ambulatory Visit
Admission: RE | Admit: 2017-04-06 | Discharge: 2017-04-06 | Disposition: A | Discharge status: Home / Self Care | Payer: BLUE CROSS/BLUE SHIELD | Source: Ambulatory Visit | Attending: General Surgery | Admitting: General Surgery

## 2017-04-06 DIAGNOSIS — F418 Other specified anxiety disorders: Secondary | ICD-10-CM | POA: Diagnosis not present

## 2017-04-06 DIAGNOSIS — L7212 Trichodermal cyst: Secondary | ICD-10-CM | POA: Insufficient documentation

## 2017-04-06 DIAGNOSIS — R220 Localized swelling, mass and lump, head: Secondary | ICD-10-CM | POA: Diagnosis present

## 2017-04-06 DIAGNOSIS — I10 Essential (primary) hypertension: Secondary | ICD-10-CM | POA: Diagnosis not present

## 2017-04-06 DIAGNOSIS — Z79890 Hormone replacement therapy (postmenopausal): Secondary | ICD-10-CM | POA: Diagnosis not present

## 2017-04-06 DIAGNOSIS — F17210 Nicotine dependence, cigarettes, uncomplicated: Secondary | ICD-10-CM | POA: Insufficient documentation

## 2017-04-06 DIAGNOSIS — D239 Other benign neoplasm of skin, unspecified: Secondary | ICD-10-CM | POA: Diagnosis not present

## 2017-04-06 DIAGNOSIS — Z9081 Acquired absence of spleen: Secondary | ICD-10-CM | POA: Insufficient documentation

## 2017-04-06 DIAGNOSIS — E039 Hypothyroidism, unspecified: Secondary | ICD-10-CM | POA: Insufficient documentation

## 2017-04-06 DIAGNOSIS — M1990 Unspecified osteoarthritis, unspecified site: Secondary | ICD-10-CM | POA: Insufficient documentation

## 2017-04-06 DIAGNOSIS — K219 Gastro-esophageal reflux disease without esophagitis: Secondary | ICD-10-CM | POA: Insufficient documentation

## 2017-04-06 HISTORY — PX: LESION EXCISION: SHX5167

## 2017-04-06 LAB — URINE DRUG SCREEN, QUALITATIVE (ARMC ONLY)
Amphetamines, Ur Screen: NOT DETECTED
Barbiturates, Ur Screen: NOT DETECTED
Benzodiazepine, Ur Scrn: NOT DETECTED
Cannabinoid 50 Ng, Ur ~~LOC~~: POSITIVE — AB
Cocaine Metabolite,Ur ~~LOC~~: POSITIVE — AB
MDMA (Ecstasy)Ur Screen: NOT DETECTED
Methadone Scn, Ur: NOT DETECTED
Opiate, Ur Screen: NOT DETECTED
Phencyclidine (PCP) Ur S: NOT DETECTED
Tricyclic, Ur Screen: NOT DETECTED

## 2017-04-06 SURGERY — EXCISION, LESION, SCALP
Anesthesia: General | Wound class: Clean

## 2017-04-06 MED ORDER — TRAMADOL HCL 50 MG PO TABS: 50.0000 mg | ORAL_TABLET | Freq: Four times a day (QID) | ORAL | 0 refills | Status: AC | PRN

## 2017-04-06 MED ORDER — DEXAMETHASONE SODIUM PHOSPHATE 10 MG/ML IJ SOLN
INTRAMUSCULAR | Status: AC
  Filled 2017-04-06: qty 1

## 2017-04-06 MED ORDER — EPHEDRINE SULFATE 50 MG/ML IJ SOLN
INTRAMUSCULAR | Status: AC
  Filled 2017-04-06: qty 2

## 2017-04-06 MED ORDER — BUPIVACAINE-EPINEPHRINE (PF) 0.5% -1:200000 IJ SOLN
INTRAMUSCULAR | Status: AC
  Filled 2017-04-06: qty 30

## 2017-04-06 MED ORDER — LIDOCAINE HCL (PF) 2 % IJ SOLN
INTRAMUSCULAR | Status: AC
  Filled 2017-04-06: qty 10

## 2017-04-06 MED ORDER — FAMOTIDINE 20 MG PO TABS
20.0000 mg | ORAL_TABLET | Freq: Once | ORAL | Status: AC
  Administered 2017-04-06: 20 mg via ORAL

## 2017-04-06 MED ORDER — ROCURONIUM BROMIDE 50 MG/5ML IV SOLN
INTRAVENOUS | Status: AC
  Filled 2017-04-06: qty 1

## 2017-04-06 MED ORDER — FAMOTIDINE 20 MG PO TABS
ORAL_TABLET | ORAL | Status: AC
  Administered 2017-04-06: 20 mg via ORAL
  Filled 2017-04-06: qty 1

## 2017-04-06 MED ORDER — CEFAZOLIN SODIUM-DEXTROSE 2-4 GM/100ML-% IV SOLN
INTRAVENOUS | Status: AC
  Administered 2017-04-06: 2 g via INTRAVENOUS
  Filled 2017-04-06: qty 100

## 2017-04-06 MED ORDER — LIDOCAINE HCL (PF) 1 % IJ SOLN
INTRAMUSCULAR | Status: AC
  Filled 2017-04-06: qty 30

## 2017-04-06 MED ORDER — BUPIVACAINE-EPINEPHRINE 0.5% -1:200000 IJ SOLN
INTRAMUSCULAR | Status: DC | PRN
  Administered 2017-04-06: 16 mL

## 2017-04-06 MED ORDER — BUPIVACAINE HCL (PF) 0.5 % IJ SOLN
INTRAMUSCULAR | Status: AC
  Filled 2017-04-06: qty 30

## 2017-04-06 MED ORDER — MIDAZOLAM HCL 2 MG/2ML IJ SOLN
INTRAMUSCULAR | Status: AC
  Filled 2017-04-06: qty 2

## 2017-04-06 MED ORDER — FENTANYL CITRATE (PF) 100 MCG/2ML IJ SOLN
INTRAMUSCULAR | Status: AC
  Filled 2017-04-06: qty 2

## 2017-04-06 MED ORDER — PHENYLEPHRINE HCL 10 MG/ML IJ SOLN
INTRAMUSCULAR | Status: AC
  Filled 2017-04-06: qty 1

## 2017-04-06 MED ORDER — SUCCINYLCHOLINE CHLORIDE 20 MG/ML IJ SOLN
INTRAMUSCULAR | Status: AC
  Filled 2017-04-06: qty 1

## 2017-04-06 MED ORDER — PROPOFOL 10 MG/ML IV BOLUS
INTRAVENOUS | Status: AC
  Filled 2017-04-06: qty 20

## 2017-04-06 MED ORDER — LACTATED RINGERS IV SOLN
INTRAVENOUS | Status: DC
  Administered 2017-04-06: 07:00:00 via INTRAVENOUS

## 2017-04-06 MED ORDER — ONDANSETRON HCL 4 MG/2ML IJ SOLN
INTRAMUSCULAR | Status: AC
  Filled 2017-04-06: qty 2

## 2017-04-06 SURGICAL SUPPLY — 30 items
BENZOIN TINCTURE PRP APPL 2/3 (GAUZE/BANDAGES/DRESSINGS) ×2 IMPLANT
CANISTER SUCT 1200ML W/VALVE (MISCELLANEOUS) ×2 IMPLANT
CHLORAPREP W/TINT 26ML (MISCELLANEOUS) ×2 IMPLANT
DERMABOND ADVANCED (GAUZE/BANDAGES/DRESSINGS) ×2
DERMABOND ADVANCED .7 DNX12 (GAUZE/BANDAGES/DRESSINGS) ×2 IMPLANT
DRAPE LAPAROTOMY 100X77 ABD (DRAPES) ×2 IMPLANT
DRAPE SURG 17X11 SM STRL (DRAPES) ×8 IMPLANT
ELECT REM PT RETURN 9FT ADLT (ELECTROSURGICAL) ×2
ELECTRODE REM PT RTRN 9FT ADLT (ELECTROSURGICAL) ×1 IMPLANT
GLOVE BIO SURGEON STRL SZ7.5 (GLOVE) ×2 IMPLANT
GLOVE INDICATOR 8.0 STRL GRN (GLOVE) ×2 IMPLANT
GOWN STRL REUS W/ TWL LRG LVL3 (GOWN DISPOSABLE) ×2 IMPLANT
GOWN STRL REUS W/TWL LRG LVL3 (GOWN DISPOSABLE) ×2
KIT TURNOVER KIT A (KITS) ×2 IMPLANT
LABEL OR SOLS (LABEL) ×2 IMPLANT
NEEDLE HYPO 25X1 1.5 SAFETY (NEEDLE) IMPLANT
NS IRRIG 1000ML POUR BTL (IV SOLUTION) ×2 IMPLANT
NS IRRIG 500ML POUR BTL (IV SOLUTION) ×2 IMPLANT
PACK BASIN MINOR ARMC (MISCELLANEOUS) ×2 IMPLANT
PREP CHG 10.5 TEAL (MISCELLANEOUS) ×2 IMPLANT
SLEEVE PROTECTION STRL DISP (MISCELLANEOUS) ×2 IMPLANT
STRIP CLOSURE SKIN 1/2X4 (GAUZE/BANDAGES/DRESSINGS) ×2 IMPLANT
SUT ETHILON 3-0 FS-10 30 BLK (SUTURE) ×2
SUT MNCRL AB 3-0 PS2 27 (SUTURE) ×6 IMPLANT
SUT VIC AB 3-0 SH 27 (SUTURE) ×3
SUT VIC AB 3-0 SH 27X BRD (SUTURE) ×3 IMPLANT
SUT VIC AB 4-0 FS2 27 (SUTURE) ×2 IMPLANT
SUTURE EHLN 3-0 FS-10 30 BLK (SUTURE) ×1 IMPLANT
SYR BULB IRRIG 60ML STRL (SYRINGE) ×2 IMPLANT
SYR CONTROL 10ML (SYRINGE) IMPLANT

## 2017-04-06 NOTE — Discharge Instructions (Signed)
°  AMBULATORY SURGERY  DISCHARGE INSTRUCTIONS   1) The drugs that you were given will stay in your system until tomorrow so for the next 24 hours you should not:  A) Drive an automobile B) Make any legal decisions C) Drink any alcoholic beverage   2) You may resume regular meals tomorrow.  Today it is better to start with liquids and gradually work up to solid foods.  You may eat anything you prefer, but it is better to start with liquids, then soup and crackers, and gradually work up to solid foods.   3) Please notify your doctor immediately if you have any unusual bleeding, trouble breathing, redness and pain at the surgery site, drainage, fever, or pain not relieved by medication.    4) Additional Instructions:        Please contact your physician with any problems or Same Day Surgery at [phone], Monday through Friday 6 am to 4 pm, or [place] [facility] number at [phone].    Diet: Resume home heart healthy regular diet.   Activity:  Do not drive or drink alcohol if taking narcotic pain medications. May start working in one week (04/11/17) on light duty  Wound care: May shower with soapy water and pat dry (do not rub incisions) starting tonight, but no baths or submerging incision underwater until follow-up. (no swimming)   Medications: Resume all home medications. For mild to moderate pain: acetaminophen (Tylenol) or ibuprofen (if no kidney disease). Combining Tylenol with alcohol can substantially increase your risk of causing liver disease. Narcotic pain medications, if prescribed, can be used for severe pain, though may cause nausea, constipation, and drowsiness. If you do not need the narcotic pain medication, you do not need to fill the prescription.  Call office [phone]) at any time if any questions, worsening pain, fevers/chills, bleeding, drainage from incision site, or other concerns.

## 2017-04-06 NOTE — Op Note (Signed)
Pre operative Diagnosis: Right Posterior scal mass         Right scalp mass         Left scalp mass         Right forehead mas   Post Operative Diagnosis: Right Posterior scal mass            Right scalp mass            Left scalp mass            Right forehead mas  Anesthesia: Local  Procedure: Excision of Right Posterior scal mass 11426                   Excision of Right scalp mass 11423                    Excision of Left scalp mass 11422                    Excision of Right forehead mass 11422  Surgeon: Dr. Windell Moment  Indication: A 42 year old male with multiple scalp and a forehead masses that are causing pain and sometimes bleeding when patient has a haircut or shaves.   Description of procedure: after orienting patient about the procedure steps and benefits and patient agreed to proceed, a time out was performed to confirm correct patient name, procedure, site of surgery and equipment. All four masses had hair removed with clipper and chloraprep was used to sterilize each area.  All masses were draped individually. Starting with the biggest mass (the right posterior scalp mass) local anesthesia was infiltrated around the palpable lesion. With a blade #15, an elliptical incision was made using the skin lines. Sharp dissection was carried down and lesion was excised including dermal tissue. Deep dermal stitches were done with vicryl 3-0 to approximate skin and skin closed with Monocryl 4-0 in subcuticular fashion. Same procedure was done for the second biggest mass (the right scalp mass), followed by the left scalp mass and finally with the right forehead mass. All masses excised using the same procedure: elliptical incision, dissection, removal and closure. The exception was the closure of the right scalp mass that was with interrupted 3-0 ethilon mattress suture. All wound were cleaned at the end with sterile water and dried. Dermabond applied  to wounds.   All fours specimens were sent to pathology.   Complications: none  EBL: 15

## 2017-04-06 NOTE — Interval H&P Note (Signed)
History and Physical Interval Note:  04/06/2017 7:33 AM  Brandon Mullen  has presented today for surgery, with the diagnosis of SCALP MASS  The various methods of treatment have been discussed with the patient and family. After consideration of risks, benefits and other options for treatment, the patient has consented to  Procedure(s): EXCISION SCALP LESION (N/A) as a surgical intervention .  The patient's history has been reviewed, patient examined, no change in status, stable for surgery.  I have reviewed the patient's chart and labs.  Questions were answered to the patient's satisfaction.     Herbert Pun

## 2017-04-06 NOTE — Anesthesia Preprocedure Evaluation (Addendum)
Anesthesia Evaluation  Patient identified by MRN, date of birth, ID band Patient awake    Reviewed: Allergy & Precautions, NPO status , Patient's Chart, lab work & pertinent test results  Airway Mallampati: II  TM Distance: >3 FB     Dental  (+) Chipped   Pulmonary Current Smoker, former smoker,    Pulmonary exam normal        Cardiovascular hypertension, Pt. on medications Normal cardiovascular exam     Neuro/Psych PSYCHIATRIC DISORDERS Anxiety Depression negative neurological ROS     GI/Hepatic GERD  ,(+)     substance abuse  alcohol use,   Endo/Other  Hypothyroidism   Renal/GU   negative genitourinary   Musculoskeletal  (+) Arthritis , Osteoarthritis,    Abdominal Normal abdominal exam  (+)   Peds negative pediatric ROS (+)  Hematology   Anesthesia Other Findings   Reproductive/Obstetrics                             Anesthesia Physical  Anesthesia Plan  ASA: II  Anesthesia Plan: General   Post-op Pain Management:    Induction: Intravenous  PONV Risk Score and Plan: TIVA  Airway Management Planned: Nasal Cannula  Additional Equipment:   Intra-op Plan:   Post-operative Plan:   Informed Consent: I have reviewed the patients History and Physical, chart, labs and discussed the procedure including the risks, benefits and alternatives for the proposed anesthesia with the patient or authorized representative who has indicated his/her understanding and acceptance.   Dental advisory given  Plan Discussed with: CRNA and Surgeon  Anesthesia Plan Comments:        Anesthesia Quick Evaluation

## 2017-04-07 ENCOUNTER — Encounter: Payer: Self-pay | Admitting: General Surgery

## 2017-04-11 LAB — SURGICAL PATHOLOGY

## 2017-08-19 ENCOUNTER — Emergency Department (HOSPITAL_COMMUNITY)
Admission: EM | Admit: 2017-08-19 | Discharge: 2017-08-20 | Discharge status: Against Medical Advice | Payer: BLUE CROSS/BLUE SHIELD | Attending: Emergency Medicine | Admitting: Emergency Medicine

## 2017-08-19 ENCOUNTER — Other Ambulatory Visit: Payer: Self-pay

## 2017-08-19 ENCOUNTER — Encounter (HOSPITAL_COMMUNITY): Payer: Self-pay | Admitting: Emergency Medicine

## 2017-08-19 DIAGNOSIS — Y999 Unspecified external cause status: Secondary | ICD-10-CM | POA: Diagnosis not present

## 2017-08-19 DIAGNOSIS — W268XXA Contact with other sharp object(s), not elsewhere classified, initial encounter: Secondary | ICD-10-CM | POA: Insufficient documentation

## 2017-08-19 DIAGNOSIS — Y929 Unspecified place or not applicable: Secondary | ICD-10-CM | POA: Diagnosis not present

## 2017-08-19 DIAGNOSIS — Y939 Activity, unspecified: Secondary | ICD-10-CM | POA: Insufficient documentation

## 2017-08-19 DIAGNOSIS — Z5321 Procedure and treatment not carried out due to patient leaving prior to being seen by health care provider: Secondary | ICD-10-CM | POA: Diagnosis not present

## 2017-08-19 DIAGNOSIS — S61412A Laceration without foreign body of left hand, initial encounter: Secondary | ICD-10-CM | POA: Insufficient documentation

## 2017-08-19 NOTE — ED Triage Notes (Signed)
Approx 1 1/2-2 inch laceration to L hand/thumb from a box cutter at 1pm.  Bleeding controlled.

## 2017-08-20 ENCOUNTER — Other Ambulatory Visit: Payer: Self-pay

## 2017-08-20 ENCOUNTER — Emergency Department
Admission: EM | Admit: 2017-08-20 | Discharge: 2017-08-20 | Disposition: A | Discharge status: Home / Self Care | Payer: BLUE CROSS/BLUE SHIELD | Attending: Emergency Medicine | Admitting: Emergency Medicine

## 2017-08-20 ENCOUNTER — Encounter: Payer: Self-pay | Admitting: Emergency Medicine

## 2017-08-20 DIAGNOSIS — S61412A Laceration without foreign body of left hand, initial encounter: Secondary | ICD-10-CM

## 2017-08-20 DIAGNOSIS — Y999 Unspecified external cause status: Secondary | ICD-10-CM | POA: Diagnosis not present

## 2017-08-20 DIAGNOSIS — Y929 Unspecified place or not applicable: Secondary | ICD-10-CM | POA: Insufficient documentation

## 2017-08-20 DIAGNOSIS — E039 Hypothyroidism, unspecified: Secondary | ICD-10-CM | POA: Insufficient documentation

## 2017-08-20 DIAGNOSIS — W269XXA Contact with unspecified sharp object(s), initial encounter: Secondary | ICD-10-CM | POA: Insufficient documentation

## 2017-08-20 DIAGNOSIS — Z23 Encounter for immunization: Secondary | ICD-10-CM | POA: Insufficient documentation

## 2017-08-20 DIAGNOSIS — Y939 Activity, unspecified: Secondary | ICD-10-CM | POA: Insufficient documentation

## 2017-08-20 DIAGNOSIS — F17210 Nicotine dependence, cigarettes, uncomplicated: Secondary | ICD-10-CM | POA: Insufficient documentation

## 2017-08-20 MED ORDER — TETANUS-DIPHTH-ACELL PERTUSSIS 5-2.5-18.5 LF-MCG/0.5 IM SUSP
0.5000 mL | Freq: Once | INTRAMUSCULAR | Status: AC
  Administered 2017-08-20: 0.5 mL via INTRAMUSCULAR
  Filled 2017-08-20: qty 0.5

## 2017-08-20 MED ORDER — CEPHALEXIN 500 MG PO CAPS: 500.0000 mg | ORAL_CAPSULE | Freq: Three times a day (TID) | ORAL | 0 refills | Status: DC

## 2017-08-20 NOTE — ED Notes (Addendum)
Called Pt 3rd time for rooming no answer

## 2017-08-20 NOTE — Discharge Instructions (Addendum)
You wound was not repaired with sutures today, due to the delay in presenting for treatment (>18 hours). Keep the wound clean, dry, and covered. The steri-strips applied should stay in place for at least a week and a half. We have boostered your tetanus and started you on a prophylactic antibiotic. Follow-up with your provider as needed.

## 2017-08-20 NOTE — ED Triage Notes (Signed)
Cut L hand with razor yesterday about 2p. Wrapped with small gauze with no bleed through noted, dressing not removed in triage.

## 2017-08-20 NOTE — ED Provider Notes (Signed)
Patient left without being seen  1. Patient left without being seen       Deno Etienne, DO 08/20/17 [phone]

## 2017-08-20 NOTE — ED Notes (Signed)
Pt ambulatory upon discharge; declined wheel chair. Verbalized understanding of discharge instructions, follow-up care and prescription. VSS. Skin warm and dry. A&O x4.  

## 2017-08-20 NOTE — ED Notes (Signed)
Called Pt for room assignment no answer

## 2017-08-20 NOTE — ED Notes (Signed)
Pt called for rooming, no answer
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
## 2017-12-09 ENCOUNTER — Ambulatory Visit: Payer: Self-pay | Admitting: Psychiatry

## 2017-12-23 ENCOUNTER — Ambulatory Visit: Payer: Self-pay | Admitting: Psychiatry

## 2017-12-30 ENCOUNTER — Other Ambulatory Visit: Payer: Self-pay

## 2017-12-30 ENCOUNTER — Encounter: Payer: Self-pay | Admitting: Psychiatry

## 2017-12-30 ENCOUNTER — Ambulatory Visit (INDEPENDENT_AMBULATORY_CARE_PROVIDER_SITE_OTHER): Payer: BLUE CROSS/BLUE SHIELD | Admitting: Psychiatry

## 2017-12-30 VITALS — BP 153/108 | HR 69 | Temp 99.3°F | Wt 202.0 lb

## 2017-12-30 DIAGNOSIS — F330 Major depressive disorder, recurrent, mild: Secondary | ICD-10-CM | POA: Diagnosis not present

## 2017-12-30 DIAGNOSIS — F1410 Cocaine abuse, uncomplicated: Secondary | ICD-10-CM

## 2017-12-30 DIAGNOSIS — F1020 Alcohol dependence, uncomplicated: Secondary | ICD-10-CM | POA: Diagnosis not present

## 2017-12-30 DIAGNOSIS — F1210 Cannabis abuse, uncomplicated: Secondary | ICD-10-CM

## 2017-12-30 DIAGNOSIS — F17200 Nicotine dependence, unspecified, uncomplicated: Secondary | ICD-10-CM

## 2017-12-30 MED ORDER — BUPROPION HCL 75 MG PO TABS: 75.0000 mg | ORAL_TABLET | Freq: Every day | ORAL | 1 refills | Status: DC

## 2017-12-30 MED ORDER — NALTREXONE HCL 50 MG PO TABS: 50.0000 mg | ORAL_TABLET | Freq: Every day | ORAL | 1 refills | Status: DC
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
## 2017-12-30 NOTE — Patient Instructions (Signed)
Bupropion tablets (Depression/Mood Disorders) What is this medicine? BUPROPION (byoo PROE pee on) is used to treat depression. This medicine may be used for other purposes; ask your health care provider or pharmacist if you have questions. COMMON BRAND NAME(S): Wellbutrin What should I tell my health care provider before I take this medicine? They need to know if you have any of these conditions: -an eating disorder, such as anorexia or bulimia -bipolar disorder or psychosis -diabetes or high blood sugar, treated with medication -glaucoma -heart disease, previous heart attack, or irregular heart beat -head injury or brain tumor -high blood pressure -kidney or liver disease -seizures -suicidal thoughts or a previous suicide attempt -Tourette's syndrome -weight loss -an unusual or allergic reaction to bupropion, other medicines, foods, dyes, or preservatives -breast-feeding -pregnant or trying to become pregnant How should I use this medicine? Take this medicine by mouth with a glass of water. Follow the directions on the prescription label. You can take it with or without food. If it upsets your stomach, take it with food. Take your medicine at regular intervals. Do not take your medicine more often than directed. Do not stop taking this medicine suddenly except upon the advice of your doctor. Stopping this medicine too quickly may cause serious side effects or your condition may worsen. A special MedGuide will be given to you by the pharmacist with each prescription and refill. Be sure to read this information carefully each time. Talk to your pediatrician regarding the use of this medicine in children. Special care may be needed. Overdosage: If you think you have taken too much of this medicine contact a poison control center or emergency room at once. NOTE: This medicine is only for you. Do not share this medicine with others. What if I miss a dose? If you miss a dose, take it as soon  as you can. If it is less than four hours to your next dose, take only that dose and skip the missed dose. Do not take double or extra doses. What may interact with this medicine? Do not take this medicine with any of the following medications: -linezolid -MAOIs like Azilect, Carbex, Eldepryl, Marplan, Nardil, and Parnate -methylene blue (injected into a vein) -other medicines that contain bupropion like Zyban This medicine may also interact with the following medications: -alcohol -certain medicines for anxiety or sleep -certain medicines for blood pressure like metoprolol, propranolol -certain medicines for depression or psychotic disturbances -certain medicines for HIV or AIDS like efavirenz, lopinavir, nelfinavir, ritonavir -certain medicines for irregular heart beat like propafenone, flecainide -certain medicines for Parkinson's disease like amantadine, levodopa -certain medicines for seizures like carbamazepine, phenytoin, phenobarbital -cimetidine -clopidogrel -cyclophosphamide -digoxin -furazolidone -isoniazid -nicotine -orphenadrine -procarbazine -steroid medicines like prednisone or cortisone -stimulant medicines for attention disorders, weight loss, or to stay awake -tamoxifen -theophylline -thiotepa -ticlopidine -tramadol -warfarin This list may not describe all possible interactions. Give your health care provider a list of all the medicines, herbs, non-prescription drugs, or dietary supplements you use. Also tell them if you smoke, drink alcohol, or use illegal drugs. Some items may interact with your medicine. What should I watch for while using this medicine? Tell your doctor if your symptoms do not get better or if they get worse. Visit your doctor or health care professional for regular checks on your progress. Because it may take several weeks to see the full effects of this medicine, it is important to continue your treatment as prescribed by your  doctor. Patients and their families  should watch out for new or worsening thoughts of suicide or depression. Also watch out for sudden changes in feelings such as feeling anxious, agitated, panicky, irritable, hostile, aggressive, impulsive, severely restless, overly excited and hyperactive, or not being able to sleep. If this happens, especially at the beginning of treatment or after a change in dose, call your health care professional. Avoid alcoholic drinks while taking this medicine. Drinking excessive alcoholic beverages, using sleeping or anxiety medicines, or quickly stopping the use of these agents while taking this medicine may increase your risk for a seizure. Do not drive or use heavy machinery until you know how this medicine affects you. This medicine can impair your ability to perform these tasks. Do not take this medicine close to bedtime. It may prevent you from sleeping. Your mouth may get dry. Chewing sugarless gum or sucking hard candy, and drinking plenty of water may help. Contact your doctor if the problem does not go away or is severe. What side effects may I notice from receiving this medicine? Side effects that you should report to your doctor or health care professional as soon as possible: -allergic reactions like skin rash, itching or hives, swelling of the face, lips, or tongue -breathing problems -changes in vision -confusion -elevated mood, decreased need for sleep, racing thoughts, impulsive behavior -fast or irregular heartbeat -hallucinations, loss of contact with reality -increased blood pressure -redness, blistering, peeling or loosening of the skin, including inside the mouth -seizures -suicidal thoughts or other mood changes -unusually weak or tired -vomiting Side effects that usually do not require medical attention (report to your doctor or health care professional if they continue or are bothersome): -constipation -headache -loss of  appetite -nausea -tremors -weight loss This list may not describe all possible side effects. Call your doctor for medical advice about side effects. You may report side effects to FDA at 1-800-FDA-1088. Where should I keep my medicine? Keep out of the reach of children. Store at room temperature between 20 and 25 degrees C (68 and 77 degrees F), away from direct sunlight and moisture. Keep tightly closed. Throw away any unused medicine after the expiration date. NOTE: This sheet is a summary. It may not cover all possible information. If you have questions about this medicine, talk to your doctor, pharmacist, or health care provider.  2018 Elsevier/Gold Standard (2015-08-08 13:44:21) Naltrexone tablets What is this medicine? NALTREXONE (nal TREX one) helps you to remain free of your dependence on opiate drugs or alcohol. It blocks the 'high' that these substances can give you. This medicine is combined with counseling and support groups. This medicine may be used for other purposes; ask your health care provider or pharmacist if you have questions. COMMON BRAND NAME(S): Depade, ReVia What should I tell my health care provider before I take this medicine? They need to know if you have any of these conditions: -if you have used drugs or alcohol within 7 to 10 days -kidney disease -liver disease, including hepatitis -an unusual or allergic reaction to naltrexone, other medicines, foods, dyes, or preservatives -pregnant or trying to get pregnant -breast-feeding How should I use this medicine? Take this medicine by mouth with a full glass of water. Follow the directions on the prescription label. Do not take this medicine within 7 to 10 days of taking any opioid drugs. Take your medicine at regular intervals. Do not take your medicine more often than directed. Do not stop taking except on your doctor's advice. Talk to your pediatrician regarding  the use of this medicine in children. Special care  may be needed. Overdosage: If you think you have taken too much of this medicine contact a poison control center or emergency room at once. NOTE: This medicine is only for you. Do not share this medicine with others. What if I miss a dose? If you miss a dose and remember on the same day, take the missed dose. If you do not remember until the next day, ask your doctor or health care professional about rescheduling your doses. Do not take double or extra doses. What may interact with this medicine? Do not take this medicine with any of the following medications: -any prescription or street opioid drug like codiene, heroin, methadone This medicine may also interact with the following medications: -disulfiram -thioridazine This list may not describe all possible interactions. Give your health care provider a list of all the medicines, herbs, non-prescription drugs, or dietary supplements you use. Also tell them if you smoke, drink alcohol, or use illegal drugs. Some items may interact with your medicine. What should I watch for while using this medicine? Your condition will be monitored carefully while you are receiving this medicine. Visit your doctor or health care professional regularly. For this medicine to be most effective you should attend any counseling or support groups that your doctor or health care professional recommends. Do not try to overcome the effects of the medicine by taking large amounts of narcotics or by drinking large amounts of alcohol. This can cause severe problems including death. Also, you may be more sensitive to lower doses of narcotics after you stop taking this medicine. If you are going to have surgery, tell your doctor or health care professional that you are taking this medicine. Do not treat yourself for coughs, colds, pain, or diarrhea. Ask your doctor or health care professional for advice. Some of the ingredients may interact with this medicine and cause side  effects. Wear a medical ID bracelet or chain, and carry a card that describes your disease and details of your medicine and dosage times. You may get drowsy or dizzy. Do not drive, use machinery, or do anything that needs mental alertness until you know how this medicine affects you. Do not stand or sit up quickly, especially if you are an older patient. This reduces the risk of dizzy or fainting spells. Alcohol may interfere with the effect of this medicine. Avoid alcoholic drinks. What side effects may I notice from receiving this medicine? Side effects that you should report to your doctor or health care professional as soon as possible: -allergic reactions like skin rash, itching or hives, swelling of the face, lips, or tongue -breathing problems -changes in vision, hearing -confusion -dark urine -depressed mood -diarrhea -fast or irregular heart beat -hallucination, loss of contact with reality -light-colored stools -right upper belly pain -suicidal thoughts or other mood changes -unusually weak or tired -vomiting -yellowing of the eyes or skin Side effects that usually do not require medical attention (report to your doctor or health care professional if they continue or are bothersome): -aches, pains -change in sex drive or performance -feeling anxious -headache -loss of appetite, nausea -runny nose, sinus problems, sneezing -stomach pain -trouble sleeping This list may not describe all possible side effects. Call your doctor for medical advice about side effects. You may report side effects to FDA at 1-800-FDA-1088. Where should I keep my medicine? Keep out of the reach of children. Store at room temperature between 20 and 25  degrees C (68 and 77 degrees F). Throw away any unused medicine after the expiration date. NOTE: This sheet is a summary. It may not cover all possible information. If you have questions about this medicine, talk to your doctor, pharmacist, or health  care provider.  2018 Elsevier/Gold Standard (2011-12-09 10:33:18)

## 2018-01-13 ENCOUNTER — Ambulatory Visit: Payer: BLUE CROSS/BLUE SHIELD | Admitting: Licensed Clinical Social Worker

## 2018-01-18 ENCOUNTER — Telehealth: Payer: Self-pay

## 2018-01-18 NOTE — Telephone Encounter (Signed)
faxed and confirm status report

## 2018-01-18 NOTE — Telephone Encounter (Signed)
received fax status report , reached out x3 phone and email with no response.

## 2018-01-18 NOTE — Telephone Encounter (Signed)
thanks

## 2018-01-18 NOTE — Telephone Encounter (Signed)
faxed and confirmed order for [place] attention specialist

## 2018-01-23 ENCOUNTER — Ambulatory Visit: Payer: BLUE CROSS/BLUE SHIELD | Admitting: Psychiatry

## 2018-01-27 ENCOUNTER — Ambulatory Visit: Payer: BLUE CROSS/BLUE SHIELD | Admitting: Psychiatry

## 2018-02-21 ENCOUNTER — Other Ambulatory Visit: Payer: Self-pay | Admitting: Psychiatry

## 2018-03-01 DIAGNOSIS — K5792 Diverticulitis of intestine, part unspecified, without perforation or abscess without bleeding: Secondary | ICD-10-CM

## 2018-03-01 HISTORY — DX: Diverticulitis of intestine, part unspecified, without perforation or abscess without bleeding: K57.92

## 2018-12-25 ENCOUNTER — Inpatient Hospital Stay
Admission: EM | Admit: 2018-12-25 | Discharge: 2019-01-10 | DRG: 329 | Disposition: A | Discharge status: Home Health | Payer: BC Managed Care – PPO | Attending: Surgery | Admitting: Surgery

## 2018-12-25 ENCOUNTER — Inpatient Hospital Stay: Payer: BC Managed Care – PPO

## 2018-12-25 ENCOUNTER — Emergency Department: Payer: BC Managed Care – PPO

## 2018-12-25 ENCOUNTER — Encounter: Payer: Self-pay | Admitting: Radiology

## 2018-12-25 ENCOUNTER — Other Ambulatory Visit: Payer: Self-pay

## 2018-12-25 DIAGNOSIS — K660 Peritoneal adhesions (postprocedural) (postinfection): Secondary | ICD-10-CM | POA: Diagnosis present

## 2018-12-25 DIAGNOSIS — K567 Ileus, unspecified: Secondary | ICD-10-CM | POA: Diagnosis not present

## 2018-12-25 DIAGNOSIS — F419 Anxiety disorder, unspecified: Secondary | ICD-10-CM | POA: Diagnosis present

## 2018-12-25 DIAGNOSIS — Z6831 Body mass index (BMI) 31.0-31.9, adult: Secondary | ICD-10-CM | POA: Diagnosis not present

## 2018-12-25 DIAGNOSIS — Z9081 Acquired absence of spleen: Secondary | ICD-10-CM

## 2018-12-25 DIAGNOSIS — Z79899 Other long term (current) drug therapy: Secondary | ICD-10-CM | POA: Diagnosis not present

## 2018-12-25 DIAGNOSIS — Z6834 Body mass index (BMI) 34.0-34.9, adult: Secondary | ICD-10-CM

## 2018-12-25 DIAGNOSIS — E669 Obesity, unspecified: Secondary | ICD-10-CM | POA: Diagnosis present

## 2018-12-25 DIAGNOSIS — K219 Gastro-esophageal reflux disease without esophagitis: Secondary | ICD-10-CM | POA: Diagnosis present

## 2018-12-25 DIAGNOSIS — K5780 Diverticulitis of intestine, part unspecified, with perforation and abscess without bleeding: Secondary | ICD-10-CM | POA: Diagnosis not present

## 2018-12-25 DIAGNOSIS — K651 Peritoneal abscess: Secondary | ICD-10-CM | POA: Diagnosis present

## 2018-12-25 DIAGNOSIS — K5720 Diverticulitis of large intestine with perforation and abscess without bleeding: Principal | ICD-10-CM | POA: Diagnosis present

## 2018-12-25 DIAGNOSIS — R1030 Lower abdominal pain, unspecified: Secondary | ICD-10-CM

## 2018-12-25 DIAGNOSIS — Z832 Family history of diseases of the blood and blood-forming organs and certain disorders involving the immune mechanism: Secondary | ICD-10-CM

## 2018-12-25 DIAGNOSIS — Z79890 Hormone replacement therapy (postmenopausal): Secondary | ICD-10-CM

## 2018-12-25 DIAGNOSIS — I10 Essential (primary) hypertension: Secondary | ICD-10-CM | POA: Diagnosis present

## 2018-12-25 DIAGNOSIS — E039 Hypothyroidism, unspecified: Secondary | ICD-10-CM | POA: Diagnosis present

## 2018-12-25 DIAGNOSIS — Z791 Long term (current) use of non-steroidal anti-inflammatories (NSAID): Secondary | ICD-10-CM

## 2018-12-25 DIAGNOSIS — Z20828 Contact with and (suspected) exposure to other viral communicable diseases: Secondary | ICD-10-CM | POA: Diagnosis present

## 2018-12-25 DIAGNOSIS — F329 Major depressive disorder, single episode, unspecified: Secondary | ICD-10-CM | POA: Diagnosis present

## 2018-12-25 DIAGNOSIS — Z8349 Family history of other endocrine, nutritional and metabolic diseases: Secondary | ICD-10-CM

## 2018-12-25 DIAGNOSIS — F17210 Nicotine dependence, cigarettes, uncomplicated: Secondary | ICD-10-CM | POA: Diagnosis present

## 2018-12-25 DIAGNOSIS — M19022 Primary osteoarthritis, left elbow: Secondary | ICD-10-CM | POA: Diagnosis present

## 2018-12-25 DIAGNOSIS — R52 Pain, unspecified: Secondary | ICD-10-CM

## 2018-12-25 DIAGNOSIS — D3502 Benign neoplasm of left adrenal gland: Secondary | ICD-10-CM | POA: Diagnosis present

## 2018-12-25 DIAGNOSIS — Z4659 Encounter for fitting and adjustment of other gastrointestinal appliance and device: Secondary | ICD-10-CM

## 2018-12-25 DIAGNOSIS — M19021 Primary osteoarthritis, right elbow: Secondary | ICD-10-CM | POA: Diagnosis present

## 2018-12-25 DIAGNOSIS — R140 Abdominal distension (gaseous): Secondary | ICD-10-CM

## 2018-12-25 LAB — CBC
HCT: 46.5 % (ref 39.0–52.0)
Hemoglobin: 15.5 g/dL (ref 13.0–17.0)
MCH: 30.5 pg (ref 26.0–34.0)
MCHC: 33.3 g/dL (ref 30.0–36.0)
MCV: 91.5 fL (ref 80.0–100.0)
Platelets: 530 10*3/uL — ABNORMAL HIGH (ref 150–400)
RBC: 5.08 MIL/uL (ref 4.22–5.81)
RDW: 14.3 % (ref 11.5–15.5)
WBC: 28.5 10*3/uL — ABNORMAL HIGH (ref 4.0–10.5)
nRBC: 0 % (ref 0.0–0.2)

## 2018-12-25 LAB — COMPREHENSIVE METABOLIC PANEL
ALT: 39 U/L (ref 0–44)
AST: 22 U/L (ref 15–41)
Albumin: 3.6 g/dL (ref 3.5–5.0)
Alkaline Phosphatase: 110 U/L (ref 38–126)
Anion gap: 14 (ref 5–15)
BUN: 15 mg/dL (ref 6–20)
CO2: 23 mmol/L (ref 22–32)
Calcium: 9.3 mg/dL (ref 8.9–10.3)
Chloride: 99 mmol/L (ref 98–111)
Creatinine, Ser: 0.75 mg/dL (ref 0.61–1.24)
GFR calc Af Amer: >60 mL/min (ref >60)
GFR calc non Af Amer: >60 mL/min (ref >60)
Glucose, Bld: 111 mg/dL — ABNORMAL HIGH (ref 70–99)
Potassium: 4.4 mmol/L (ref 3.5–5.1)
Sodium: 136 mmol/L (ref 135–145)
Total Bilirubin: 0.9 mg/dL (ref 0.3–1.2)
Total Protein: 7.4 g/dL (ref 6.5–8.1)

## 2018-12-25 LAB — LIPASE, BLOOD: Lipase: 22 U/L (ref 11–51)

## 2018-12-25 LAB — URINALYSIS, COMPLETE (UACMP) WITH MICROSCOPIC
Bilirubin Urine: NEGATIVE
Glucose, UA: NEGATIVE mg/dL
Ketones, ur: NEGATIVE mg/dL
Leukocytes,Ua: NEGATIVE
Nitrite: NEGATIVE
Protein, ur: NEGATIVE mg/dL
Specific Gravity, Urine: 1.012 (ref 1.005–1.030)
Squamous Epithelial / LPF: NONE SEEN (ref 0–5)
pH: 6 (ref 5.0–8.0)

## 2018-12-25 LAB — SARS CORONAVIRUS 2 BY RT PCR (HOSPITAL ORDER, PERFORMED IN ~~LOC~~ HOSPITAL LAB): SARS Coronavirus 2: NEGATIVE

## 2018-12-25 MED ORDER — LEVOTHYROXINE SODIUM 175 MCG PO TABS
175.0000 ug | ORAL_TABLET | Freq: Every day | ORAL | Status: DC
  Administered 2018-12-26 – 2019-01-10 (×16): 175 ug via ORAL
  Filled 2018-12-25 (×16): qty 1

## 2018-12-25 MED ORDER — FENTANYL CITRATE (PF) 100 MCG/2ML IJ SOLN
75.0000 ug | Freq: Once | INTRAMUSCULAR | Status: AC
  Administered 2018-12-25: 13:00:00 75 ug via INTRAVENOUS

## 2018-12-25 MED ORDER — ONDANSETRON HCL 4 MG/2ML IJ SOLN: 4.0000 mg | Freq: Four times a day (QID) | INTRAMUSCULAR | Status: DC | PRN

## 2018-12-25 MED ORDER — MIDAZOLAM HCL 2 MG/2ML IJ SOLN
INTRAMUSCULAR | Status: AC | PRN
  Administered 2018-12-25 (×2): 1 mg via INTRAVENOUS

## 2018-12-25 MED ORDER — FENTANYL CITRATE (PF) 100 MCG/2ML IJ SOLN
INTRAMUSCULAR | Status: AC
  Filled 2018-12-25: qty 2

## 2018-12-25 MED ORDER — ONDANSETRON HCL 4 MG/2ML IJ SOLN
4.0000 mg | Freq: Once | INTRAMUSCULAR | Status: AC
  Administered 2018-12-25: 4 mg via INTRAVENOUS
  Filled 2018-12-25: qty 2

## 2018-12-25 MED ORDER — PIPERACILLIN-TAZOBACTAM 3.375 G IVPB: 3.3750 g | Freq: Three times a day (TID) | INTRAVENOUS | Status: DC

## 2018-12-25 MED ORDER — SODIUM CHLORIDE 0.9% FLUSH
5.0000 mL | Freq: Three times a day (TID) | INTRAVENOUS | Status: DC
  Administered 2018-12-25 – 2018-12-27 (×5): 5 mL

## 2018-12-25 MED ORDER — FENTANYL CITRATE (PF) 100 MCG/2ML IJ SOLN
75.0000 ug | Freq: Once | INTRAMUSCULAR | Status: AC
  Administered 2018-12-25: 75 ug via INTRAVENOUS

## 2018-12-25 MED ORDER — MIDAZOLAM HCL 5 MG/5ML IJ SOLN
INTRAMUSCULAR | Status: AC
  Filled 2018-12-25: qty 5

## 2018-12-25 MED ORDER — KETOROLAC TROMETHAMINE 30 MG/ML IJ SOLN
30.0000 mg | Freq: Four times a day (QID) | INTRAMUSCULAR | Status: AC
  Administered 2018-12-25 – 2018-12-30 (×17): 30 mg via INTRAVENOUS
  Filled 2018-12-25 (×19): qty 1

## 2018-12-25 MED ORDER — LACTATED RINGERS IV BOLUS
1000.0000 mL | Freq: Once | INTRAVENOUS | Status: AC
  Administered 2018-12-25: 1000 mL via INTRAVENOUS

## 2018-12-25 MED ORDER — ACETAMINOPHEN 500 MG PO TABS
1000.0000 mg | ORAL_TABLET | Freq: Four times a day (QID) | ORAL | Status: DC
  Administered 2018-12-25 – 2018-12-26 (×4): 1000 mg via ORAL
  Filled 2018-12-25 (×6): qty 2

## 2018-12-25 MED ORDER — IOHEXOL 300 MG/ML  SOLN
125.0000 mL | Freq: Once | INTRAMUSCULAR | Status: AC | PRN
  Administered 2018-12-25: 125 mL via INTRAVENOUS

## 2018-12-25 MED ORDER — MORPHINE SULFATE (PF) 4 MG/ML IV SOLN
4.0000 mg | Freq: Once | INTRAVENOUS | Status: AC
  Administered 2018-12-25: 4 mg via INTRAVENOUS
  Filled 2018-12-25: qty 1

## 2018-12-25 MED ORDER — MORPHINE SULFATE (PF) 2 MG/ML IV SOLN
2.0000 mg | INTRAVENOUS | Status: DC | PRN
  Administered 2018-12-25 – 2018-12-26 (×2): 4 mg via INTRAVENOUS
  Administered 2018-12-26: 2 mg via INTRAVENOUS
  Filled 2018-12-25: qty 2
  Filled 2018-12-25: qty 1
  Filled 2018-12-25: qty 2

## 2018-12-25 MED ORDER — FENTANYL CITRATE (PF) 100 MCG/2ML IJ SOLN
INTRAMUSCULAR | Status: AC
  Administered 2018-12-25: 75 ug via INTRAVENOUS
  Filled 2018-12-25: qty 2

## 2018-12-25 MED ORDER — TAMSULOSIN HCL 0.4 MG PO CAPS
0.4000 mg | ORAL_CAPSULE | Freq: Every day | ORAL | Status: DC
  Administered 2018-12-25 – 2019-01-10 (×17): 0.4 mg via ORAL
  Filled 2018-12-25 (×17): qty 1

## 2018-12-25 MED ORDER — FENTANYL CITRATE (PF) 100 MCG/2ML IJ SOLN
INTRAMUSCULAR | Status: AC | PRN
  Administered 2018-12-25 (×2): 50 ug via INTRAVENOUS

## 2018-12-25 MED ORDER — LISINOPRIL 20 MG PO TABS
20.0000 mg | ORAL_TABLET | Freq: Every day | ORAL | Status: DC
  Administered 2018-12-26 – 2019-01-10 (×16): 20 mg via ORAL
  Filled 2018-12-25 (×11): qty 1
  Filled 2018-12-25: qty 2
  Filled 2018-12-25 (×6): qty 1

## 2018-12-25 MED ORDER — SODIUM CHLORIDE 0.9 % IV BOLUS
1000.0000 mL | Freq: Once | INTRAVENOUS | Status: AC
  Administered 2018-12-25: 1000 mL via INTRAVENOUS

## 2018-12-25 MED ORDER — SODIUM CHLORIDE 0.9 % IV SOLN
Freq: Once | INTRAVENOUS | Status: AC
  Administered 2018-12-25: 09:00:00 via INTRAVENOUS

## 2018-12-25 MED ORDER — HYDRALAZINE HCL 20 MG/ML IJ SOLN: 10.0000 mg | INTRAMUSCULAR | Status: DC | PRN

## 2018-12-25 MED ORDER — ONDANSETRON 4 MG PO TBDP
4.0000 mg | ORAL_TABLET | Freq: Four times a day (QID) | ORAL | Status: DC | PRN
  Administered 2019-01-05: 4 mg via ORAL
  Filled 2018-12-25: qty 1

## 2018-12-25 MED ORDER — KETOROLAC TROMETHAMINE 30 MG/ML IJ SOLN: 30.0000 mg | Freq: Four times a day (QID) | INTRAMUSCULAR | Status: DC | PRN

## 2018-12-25 MED ORDER — SODIUM CHLORIDE 0.9 % IV SOLN
INTRAVENOUS | Status: DC
  Administered 2018-12-26 – 2018-12-27 (×4): via INTRAVENOUS

## 2018-12-25 MED ORDER — PIPERACILLIN-TAZOBACTAM 3.375 G IVPB
3.3750 g | Freq: Three times a day (TID) | INTRAVENOUS | Status: DC
  Administered 2018-12-25 – 2019-01-10 (×48): 3.375 g via INTRAVENOUS
  Filled 2018-12-25 (×47): qty 50

## 2018-12-25 NOTE — Procedures (Signed)
Interventional Radiology Procedure Note  Procedure: CT Guided Drainage of diverticular abscess  Complications: None  Estimated Blood Loss: < 10 mL  Findings: 12 Fr drain placed in diverticular abscess with return of purulent fluid. Fluid sample sent for culture analysis. Drain attached to suction bulb drainage.  Will follow.  Venetia Night. Kathlene Cote, M.D Pager:  [phone]

## 2018-12-25 NOTE — ED Notes (Signed)
Entered room to give pt's toradol and tylenol. Pt sitting upright on edge of bed, profusely diaphoretic, gray pallor. VS 118/82, P105. Pt states pain suddenly spiked extremely high, in same location. EDP Isaacs called to bedside with bedside ultrasound.

## 2018-12-25 NOTE — Progress Notes (Signed)
Pharmacy Antibiotic Note  Brandon Mullen is a 43 y.o. male admitted on 12/25/2018 with Intra-abdominal infection.  Pharmacy has been consulted for Zosyn dosing.  Plan: Zosyn 3.375g IV q8h (4 hour infusion).  Height: 5\' 8"  (172.7 cm) Weight: 230 lb (104.3 kg) IBW/kg (Calculated) : 68.4  Temp (24hrs), Avg:98.6 F (37 C), Min:98.6 F (37 C), Max:98.6 F (37 C)  Recent Labs  Lab 12/25/18 0602  WBC 28.5*  CREATININE 0.75    Estimated Creatinine Clearance: 136.6 mL/min (by C-G formula based on SCr of 0.75 mg/dL).    No Known Allergies  Antimicrobials this admission: Zosyn 10/26 >>   Thank you for allowing pharmacy to be a part of this patient's care.  Paulina Fusi, PharmD, BCPS 12/25/2018 9:45 AM

## 2018-12-25 NOTE — ED Notes (Signed)
Called IR RN back. Pt is not going to OR, scheduled for IR around 1600. Pt notified of plan. Pt states pain has eased off somewhat with fentanyl and sitting upright in chair. Will continue to monitor.

## 2018-12-25 NOTE — ED Notes (Signed)
Dr Charna Archer at bedside with this RN; pt reports low abd pain x 3 weeks; saw his primary and was prescribed Flomax for possible kidney stone; pt says his pain is no better;  pt denies he's ever had urinary s/s; pt reports no good BM x 3 weeks; small amount of hard stool occasionally with some liquid stool as well; pt says he does get gas pains, they are sharp; reports pain also gets worse after eating; tender to lower abd on palpation, worse to low midline and left lower quadrant; rectal exam performed by MD with this RN present-no fecal impaction per MD: pt in no acute distress;

## 2018-12-25 NOTE — ED Triage Notes (Signed)
Pt with mid lower abd pain and pelvic pain for three weeks. Pt states has seen pmd for same and was prescribed flomax with no improvement in symptoms. Pt denies vomiting or fever.
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
## 2018-12-25 NOTE — ED Notes (Signed)
XR called for stat abd to be done

## 2018-12-25 NOTE — ED Notes (Signed)
Olean Ree, PA at bedside

## 2018-12-25 NOTE — ED Notes (Signed)
In to start IV fluids as ordered; pt voiced need to void; given sterile cup for collection; pt will open door when finished;

## 2018-12-25 NOTE — ED Notes (Signed)
Received call from Radiology RN for report. Explained that pt has had an acute status change and is pending surgery team decision whether to go to OR immediately or not.

## 2018-12-25 NOTE — ED Notes (Signed)
ED Provider at bedside. 
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
## 2018-12-25 NOTE — ED Provider Notes (Signed)
Assumed care from Dr. Charna Archer at 7 AM. Briefly, the patient is a 43 y.o. male with PMHx of  has a past medical history of Alcohol abuse, Arthritis, GERD (gastroesophageal reflux disease), Hernia, umbilical, Hypertension, and Hypothyroidism. here with abdominal paijn. H/o splenectomy. Pt with marked leukocytosis here but is HDS. CT pending   Labs Reviewed  COMPREHENSIVE METABOLIC PANEL - Abnormal; Notable for the following components:      Result Value   Glucose, Bld 111 (*)    All other components within normal limits  CBC - Abnormal; Notable for the following components:   WBC 28.5 (*)    Platelets 530 (*)    All other components within normal limits  URINALYSIS, COMPLETE (UACMP) WITH MICROSCOPIC - Abnormal; Notable for the following components:   Color, Urine YELLOW (*)    APPearance CLEAR (*)    Hgb urine dipstick SMALL (*)    Bacteria, UA RARE (*)    All other components within normal limits  CULTURE, BLOOD (ROUTINE X 2)  CULTURE, BLOOD (ROUTINE X 2)  LIPASE, BLOOD    Course of Care: -CT reviewed by me, concerning for perforated diverticulum vs colitis w/ intra-abd abscess, with surrounding stranding. Zosyn, BCx sent. Plan to likely discuss w/ Surgery.  -CT confirms perforated diverticulitis with intra-abdominal abscess.  Updated patient.  Discussed with surgery, who will see and admit the patient.  I have ordered blood cultures and IV Zosyn for broad-spectrum coverage.  Fluids started.  N.p.o.  Covid pending.  CRITICAL CARE Performed by: Evonnie Pat   Total critical care time: 35 minutes  Critical care time was exclusive of separately billable procedures and treating other patients.  Critical care was necessary to treat or prevent imminent or life-threatening deterioration.  Critical care was time spent personally by me on the following activities: development of treatment plan with patient and/or surrogate as well as nursing, discussions with consultants, evaluation of  patient's response to treatment, examination of patient, obtaining history from patient or surrogate, ordering and performing treatments and interventions, ordering and review of laboratory studies, ordering and review of radiographic studies, pulse oximetry and re-evaluation of patient's condition.     Duffy Bruce, MD 12/25/18 [phone]

## 2018-12-26 LAB — CBC
HCT: 41.7 % (ref 39.0–52.0)
Hemoglobin: 14.3 g/dL (ref 13.0–17.0)
MCH: 30.8 pg (ref 26.0–34.0)
MCHC: 34.3 g/dL (ref 30.0–36.0)
MCV: 89.9 fL (ref 80.0–100.0)
Platelets: 498 10*3/uL — ABNORMAL HIGH (ref 150–400)
RBC: 4.64 MIL/uL (ref 4.22–5.81)
RDW: 14.2 % (ref 11.5–15.5)
WBC: 32.9 10*3/uL — ABNORMAL HIGH (ref 4.0–10.5)
nRBC: 0 % (ref 0.0–0.2)

## 2018-12-26 LAB — BASIC METABOLIC PANEL
Anion gap: 9 (ref 5–15)
BUN: 24 mg/dL — ABNORMAL HIGH (ref 6–20)
CO2: 23 mmol/L (ref 22–32)
Calcium: 8.8 mg/dL — ABNORMAL LOW (ref 8.9–10.3)
Chloride: 106 mmol/L (ref 98–111)
Creatinine, Ser: 0.93 mg/dL (ref 0.61–1.24)
GFR calc Af Amer: >60 mL/min (ref >60)
GFR calc non Af Amer: >60 mL/min (ref >60)
Glucose, Bld: 125 mg/dL — ABNORMAL HIGH (ref 70–99)
Potassium: 4.9 mmol/L (ref 3.5–5.1)
Sodium: 138 mmol/L (ref 135–145)

## 2018-12-26 LAB — HIV ANTIBODY (ROUTINE TESTING W REFLEX): HIV Screen 4th Generation wRfx: NONREACTIVE

## 2018-12-26 MED ORDER — OXYCODONE HCL 5 MG PO TABS
5.0000 mg | ORAL_TABLET | ORAL | Status: DC | PRN
  Filled 2018-12-26: qty 1

## 2018-12-26 MED ORDER — HYDROMORPHONE HCL 1 MG/ML IJ SOLN
0.5000 mg | INTRAMUSCULAR | Status: DC | PRN
  Administered 2018-12-26 – 2019-01-09 (×73): 0.5 mg via INTRAVENOUS
  Filled 2018-12-26 (×72): qty 0.5

## 2018-12-26 NOTE — Progress Notes (Signed)
[facility] SURGICAL PROGRESS NOTE (cpt [phone])  Hospital Day(s): 1.   Interval History: Patient seen and examined, no acute events or new complaints overnight. Patient reports that he feels better but he is still having abdominal pain and does continue to note distension. No fever, chills, nausea, or emesis. He is having bowel function. Percutaneous drain placed yesterday by IR with 70 ccs out. Cultures pending. Leukocytosis to 32K.    Review of Systems:  Constitutional: denies fever, chills  HEENT: denies cough or congestion  Respiratory: denies any shortness of breath  Cardiovascular: denies chest pain or palpitations  Gastrointestinal: + abdominal pain, + distension, denied N/V, or diarrhea/and bowel function as per interval history Genitourinary: denies burning with urination or urinary frequency   Vital signs in last 24 hours: [min-max] current  Temp:  [97.9 F (36.6 C)-100.1 F (37.8 C)] 97.9 F (36.6 C) (10/27 0423) Pulse Rate:  [86-144] 91 (10/27 0423) Resp:  [15-29] 18 (10/27 0423) BP: (105-157)/(59-101) 112/81 (10/27 0423) SpO2:  [91 %-100 %] 94 % (10/27 0423)     Height: 5\' 8"  (172.7 cm) Weight: 104.3 kg BMI (Calculated): 34.98   Intake/Output last 2 shifts:  10/26 0701 - 10/27 0700 In: 2739.5 [I.V.:1457; IV Piggyback:1282.5] Out: 270 [Urine:200; Drains:70]   Physical Exam:  Constitutional: alert, cooperative and no distress  HENT: normocephalic without obvious abnormality  Eyes: PERRL, EOM's grossly intact and symmetric  Respiratory: breathing non-labored at rest  Cardiovascular: regular rate and sinus rhythm  Gastrointestinal: soft, diffuse tenderness worse in suprapubic region, distended. Drain in suprapubic region with serosanguinous output Musculoskeletal: no edema or wounds, motor and sensation grossly intact, NT    Labs:  CBC Latest Ref Rng & Units 12/26/2018 12/25/2018 10/22/2016  WBC 4.0 - 10.5 K/uL 32.9(H) 28.5(H) 16.6(H)  Hemoglobin  13.0 - 17.0 g/dL 14.3 15.5 10.2(L)  Hematocrit 39.0 - 52.0 % 41.7 46.5 29.5(L)  Platelets 150 - 400 K/uL 498(H) 530(H) 258   CMP Latest Ref Rng & Units 12/26/2018 12/25/2018 05/19/2015  Glucose 70 - 99 mg/dL 125(H) 111(H) 118(H)  BUN 6 - 20 mg/dL 24(H) 15 7  Creatinine 0.61 - 1.24 mg/dL 0.93 0.75 0.82  Sodium 135 - 145 mmol/L 138 136 143  Potassium 3.5 - 5.1 mmol/L 4.9 4.4 3.6  Chloride 98 - 111 mmol/L 106 99 101  CO2 22 - 32 mmol/L 23 23 28   Calcium 8.9 - 10.3 mg/dL 8.8(L) 9.3 9.0  Total Protein 6.5 - 8.1 g/dL - 7.4 6.6  Total Bilirubin 0.3 - 1.2 mg/dL - 0.9 0.6  Alkaline Phos 38 - 126 U/L - 110 76  AST 15 - 41 U/L - 22 22  ALT 0 - 44 U/L - 39 20    Imaging studies: No new pertinent imaging studies   Assessment/Plan: (ICD-10's: K70.80) 43 y.o. 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   - Continue NPO + IVF             - IV ABx (Zosyn --> day 2)             - pain control prn; antiemetics prn             - monitor abdominal examination   - Monitor drain output   - No indication for emergent surgical intervention currently. We will continue to follow closely. He understands if he clinically deteriorates he may require more  emergent intervention (ex: Hartman's).    - Given the complexity of his diverticulitis he may benefit from elective surgical intervention in the future if we can get him through this acute event conservatively   - Mobilization encouraged             - medical management of comorbidities: home meds               - DVT prophylaxis; okay to restart  All of the above findings and recommendations were discussed with the patient, and the medical team, and all of patient's questions were answered to his expressed satisfaction.  -- Edison Simon, PA-C [facility] 12/26/2018, 7:23  AM [phone] M-F: 7am - 4pm

## 2018-12-27 ENCOUNTER — Inpatient Hospital Stay: Payer: BC Managed Care – PPO | Admitting: Anesthesiology

## 2018-12-27 ENCOUNTER — Inpatient Hospital Stay: Payer: BC Managed Care – PPO

## 2018-12-27 ENCOUNTER — Encounter
Admission: EM | Disposition: A | Discharge status: Home Health | Payer: Self-pay | Source: Home / Self Care | Attending: Surgery

## 2018-12-27 DIAGNOSIS — K5720 Diverticulitis of large intestine with perforation and abscess without bleeding: Principal | ICD-10-CM

## 2018-12-27 HISTORY — PX: LAPAROTOMY: SHX154

## 2018-12-27 HISTORY — PX: COLECTOMY WITH COLOSTOMY CREATION/HARTMANN PROCEDURE: SHX6598

## 2018-12-27 LAB — BASIC METABOLIC PANEL
Anion gap: 9 (ref 5–15)
BUN: 27 mg/dL — ABNORMAL HIGH (ref 6–20)
CO2: 26 mmol/L (ref 22–32)
Calcium: 8.7 mg/dL — ABNORMAL LOW (ref 8.9–10.3)
Chloride: 105 mmol/L (ref 98–111)
Creatinine, Ser: 0.96 mg/dL (ref 0.61–1.24)
GFR calc Af Amer: >60 mL/min (ref >60)
GFR calc non Af Amer: >60 mL/min (ref >60)
Glucose, Bld: 103 mg/dL — ABNORMAL HIGH (ref 70–99)
Potassium: 4.5 mmol/L (ref 3.5–5.1)
Sodium: 140 mmol/L (ref 135–145)

## 2018-12-27 LAB — CBC WITH DIFFERENTIAL/PLATELET
Abs Immature Granulocytes: 0.53 10*3/uL — ABNORMAL HIGH (ref 0.00–0.07)
Basophils Absolute: 0.1 10*3/uL (ref 0.0–0.1)
Basophils Relative: 0 %
Eosinophils Absolute: 0.3 10*3/uL (ref 0.0–0.5)
Eosinophils Relative: 1 %
HCT: 39.5 % (ref 39.0–52.0)
Hemoglobin: 13.2 g/dL (ref 13.0–17.0)
Immature Granulocytes: 2 %
Lymphocytes Relative: 5 %
Lymphs Abs: 1.8 10*3/uL (ref 0.7–4.0)
MCH: 30.7 pg (ref 26.0–34.0)
MCHC: 33.4 g/dL (ref 30.0–36.0)
MCV: 91.9 fL (ref 80.0–100.0)
Monocytes Absolute: 1.7 10*3/uL — ABNORMAL HIGH (ref 0.1–1.0)
Monocytes Relative: 5 %
Neutro Abs: 31.7 10*3/uL — ABNORMAL HIGH (ref 1.7–7.7)
Neutrophils Relative %: 87 %
Platelets: 465 10*3/uL — ABNORMAL HIGH (ref 150–400)
RBC: 4.3 MIL/uL (ref 4.22–5.81)
RDW: 14.5 % (ref 11.5–15.5)
WBC: 36.1 10*3/uL — ABNORMAL HIGH (ref 4.0–10.5)
nRBC: 0 % (ref 0.0–0.2)

## 2018-12-27 LAB — MAGNESIUM: Magnesium: 2.3 mg/dL (ref 1.7–2.4)

## 2018-12-27 LAB — GLUCOSE, CAPILLARY: Glucose-Capillary: 131 mg/dL — ABNORMAL HIGH (ref 70–99)

## 2018-12-27 IMAGING — CT CT ABD-PELV W/ CM
2 of 5 series · 15 of 46 positions shown, 17 images · IV contrast (APPLIED)
Comparison: [DATE]

CLINICAL DATA: 43-year-old male with a history of diverticular
abscess, with percutaneous drainage performed [DATE]

EXAM:
CT ABDOMEN AND PELVIS WITH CONTRAST
TECHNIQUE: Multidetector CT imaging of the abdomen and pelvis was performed
using the standard protocol following bolus administration of
intravenous contrast.
CONTRAST:  100mL OMNIPAQUE IOHEXOL 300 MG/ML  SOLN

[Series 2: axial st · axial · 0.85mm/px · z∈[+430,+880]mm · 12 of 104 slices shown, 14 images]
[im 7/104  soft-tissue]
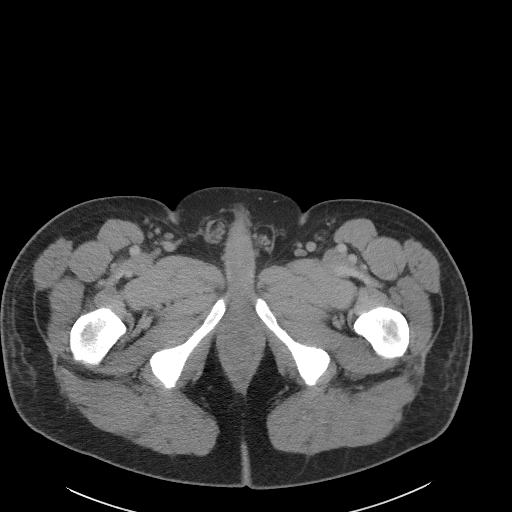
[im 7/104  bone]
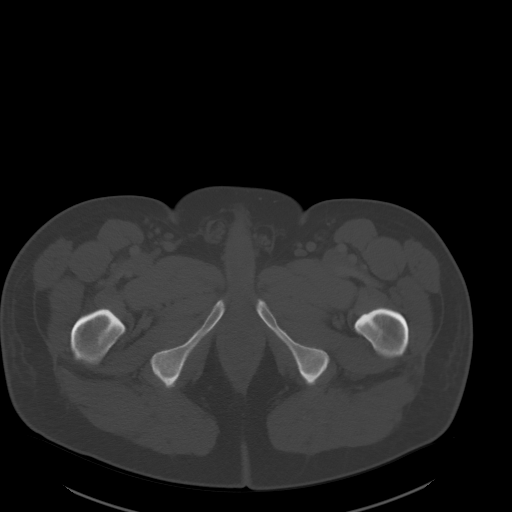
[im 14/104  soft-tissue]
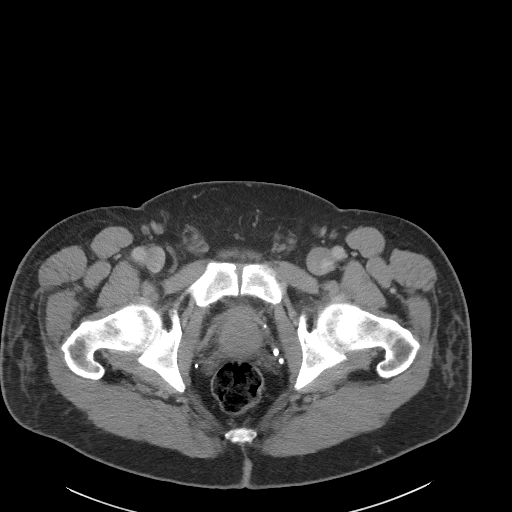
[im 21/104  soft-tissue]
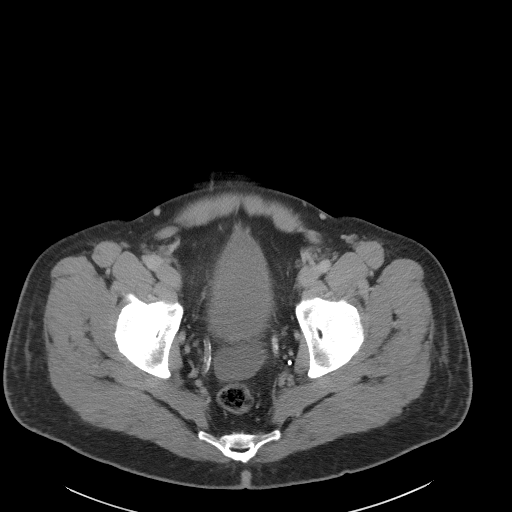
[im 35/104  soft-tissue]
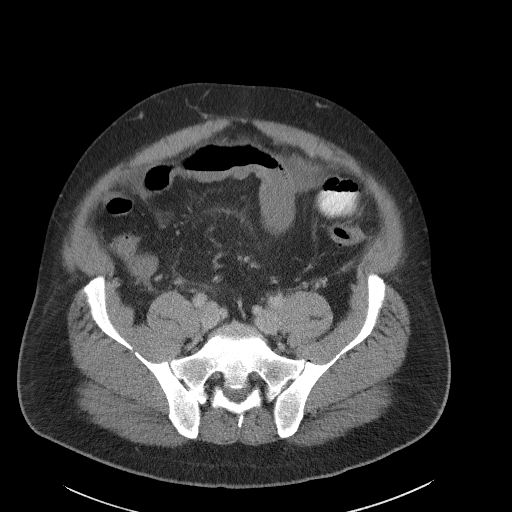
[im 42/104  soft-tissue]
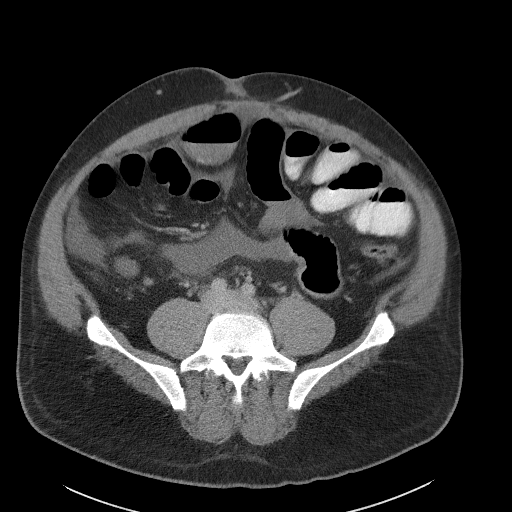
[im 49/104  soft-tissue]
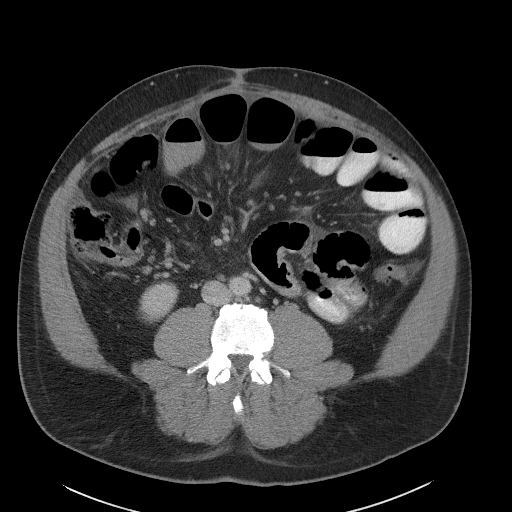
[im 55/104  soft-tissue]
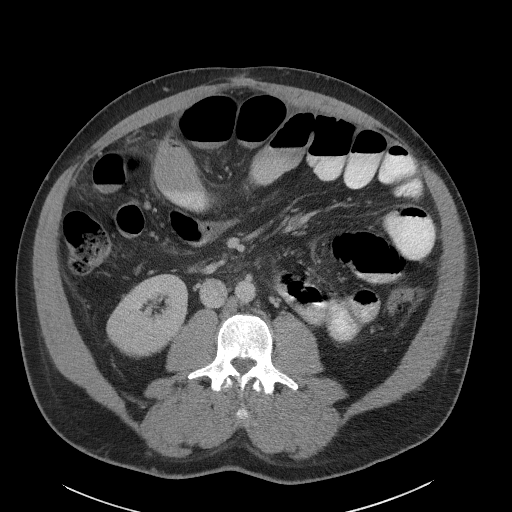
[im 62/104  soft-tissue]
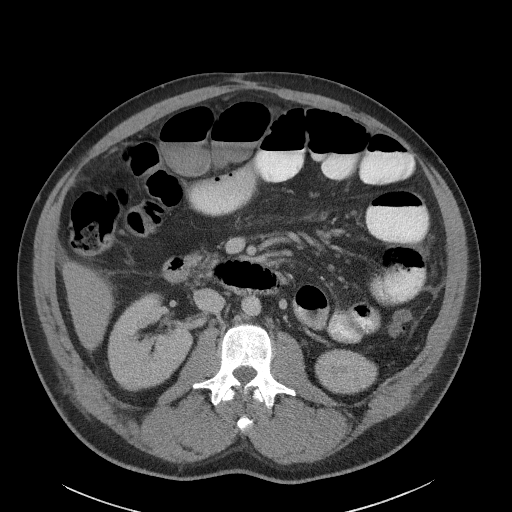
[im 69/104  soft-tissue]
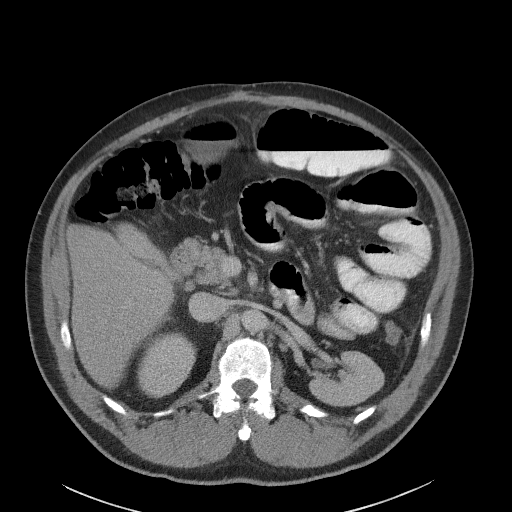
[im 69/104  bone]
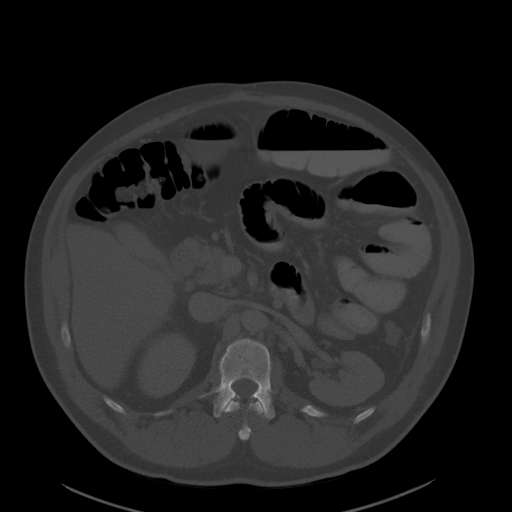
[im 83/104  soft-tissue]
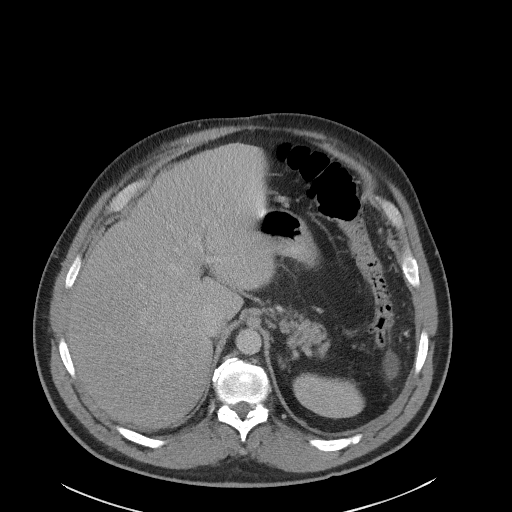
[im 90/104  soft-tissue]
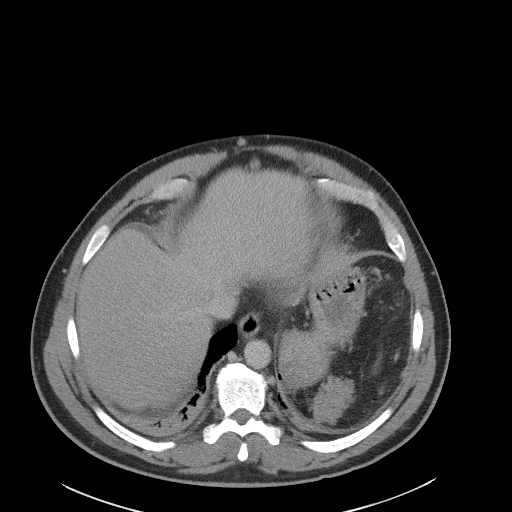
[im 97/104  soft-tissue]
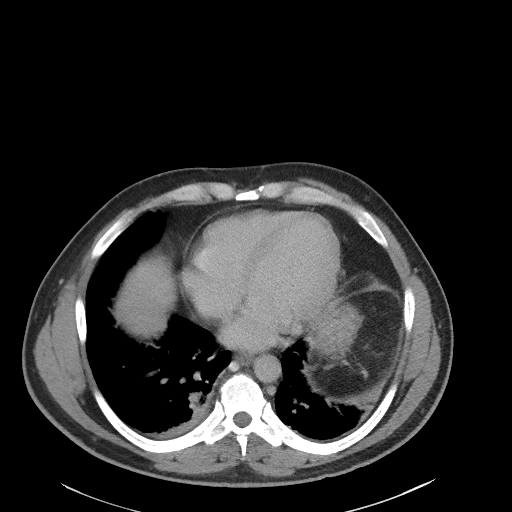

[Series 5: coronal st · coronal · 0.80mm/px · 3 of 122 slices shown]
[im 41/122  soft-tissue]
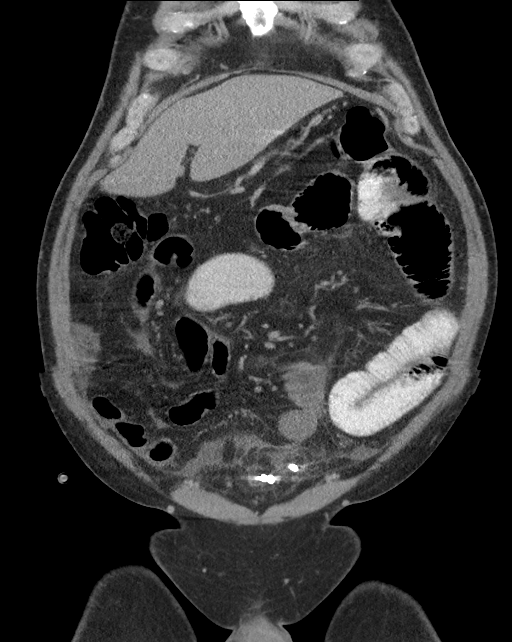
[im 54/122  soft-tissue]
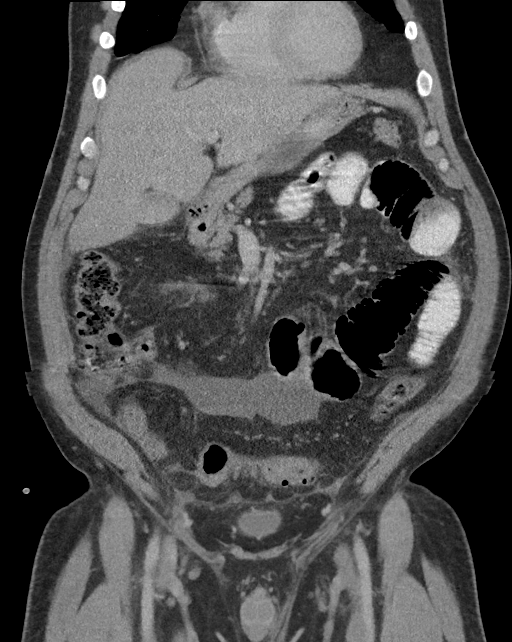
[im 68/122  soft-tissue]
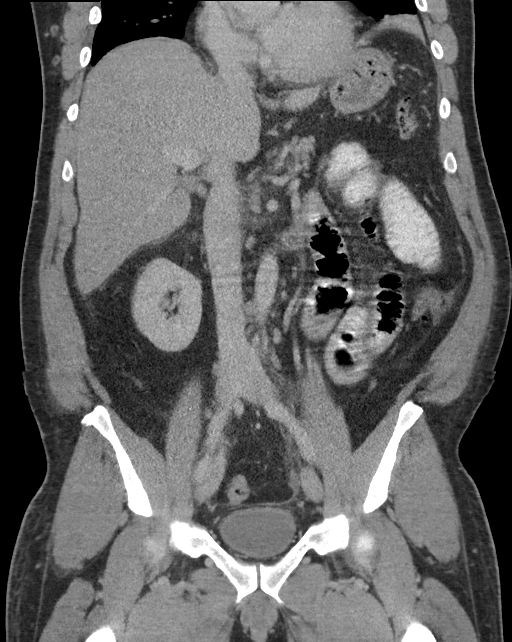

[15 of 46 positions shown; findings below may reference images not displayed]

FINDINGS: Lower chest: Atelectasis at the lung bases.

Hepatobiliary: Unremarkable appearance of the liver. High density
material within the gallbladder compatible with vicarious excretion
of contrast. No pericholecystic fluid inflammatory changes.

Pancreas: Unremarkable

Spleen: Absent spleen

Adrenals/Urinary Tract: Unremarkable appearance of the adrenal
glands. No evidence of hydronephrosis of the right or left kidney.
No nephrolithiasis. Unremarkable course of the bilateral ureters.
Unremarkable appearance of the urinary bladder.

Stomach/Bowel: Unremarkable stomach.

The mid and distal small bowel fluid-filled and distended with
multiple air-fluid levels. Terminal ileum is relatively
decompressed.

Normal appendix.

No significant small bowel wall thickening. No distinct transition
point to indicate obstruction.

No significant stool burden. Circumferential inflammatory changes
and wall thickening of the sigmoid colon adjacent to the pigtail
drainage catheter. Overall there is not significant changed in the
wall thickening compared to the prior CT. Diverticular disease.

Unchanged position of the pigtail drainage catheter anterior to the
sigmoid colon with near complete resolution of the prior
phlegmon/abscess. Persisting stranding/haziness of the fat with
trace fluid in the peritoneal regions, including small volume fluid
layer dependently within the pelvis without evidence of rim
enhancement or loculation. Reactive fluid within the mid abdomen.

Vascular/Lymphatic: No significant atherosclerotic changes.
Bilateral iliac arteries and proximal femoral arteries appear
patent.

Small lymph nodes of the retroperitoneum.  No inguinal adenopathy.

Reproductive: Unremarkable pelvic structures

Other: No evidence of ventral wall hernia.

Musculoskeletal: No acute displaced fracture. Degenerative changes
of the visualized thoracolumbar spine.
IMPRESSION: Unchanged position of the pigtail drainage catheter on the anterior
aspect of the sigmoid colon, with near complete resolution of the
prior phlegmon/abscess.

Persisting inflammatory changes of the sigmoid colon compatible with
diverticulitis. Reactive changes of the mesenteric fat with trace
reactive free fluid in the mid abdomen and dependent pelvis.

Evidence of small bowel ileus given the inflammatory changes of the
abdomen with multiple dilated small bowel loops containing air-fluid
levels.

## 2018-12-27 SURGERY — LAPAROTOMY, EXPLORATORY
Anesthesia: General

## 2018-12-27 MED ORDER — BUPIVACAINE-EPINEPHRINE (PF) 0.25% -1:200000 IJ SOLN
INTRAMUSCULAR | Status: DC | PRN
  Administered 2018-12-27: 30 mL

## 2018-12-27 MED ORDER — LACTATED RINGERS IV SOLN
INTRAVENOUS | Status: DC
  Administered 2018-12-27 – 2018-12-29 (×6): via INTRAVENOUS

## 2018-12-27 MED ORDER — PIPERACILLIN-TAZOBACTAM 3.375 G IVPB
INTRAVENOUS | Status: AC
  Filled 2018-12-27: qty 50

## 2018-12-27 MED ORDER — DEXAMETHASONE SODIUM PHOSPHATE 10 MG/ML IJ SOLN
INTRAMUSCULAR | Status: DC | PRN
  Administered 2018-12-27: 8 mg via INTRAVENOUS

## 2018-12-27 MED ORDER — PROMETHAZINE HCL 25 MG/ML IJ SOLN: 6.2500 mg | INTRAMUSCULAR | Status: DC | PRN

## 2018-12-27 MED ORDER — ACETAMINOPHEN 10 MG/ML IV SOLN
INTRAVENOUS | Status: AC
  Filled 2018-12-27: qty 100

## 2018-12-27 MED ORDER — FENTANYL CITRATE (PF) 100 MCG/2ML IJ SOLN
INTRAMUSCULAR | Status: AC
  Filled 2018-12-27: qty 2

## 2018-12-27 MED ORDER — ONDANSETRON HCL 4 MG/2ML IJ SOLN
INTRAMUSCULAR | Status: DC | PRN
  Administered 2018-12-27: 4 mg via INTRAVENOUS

## 2018-12-27 MED ORDER — LACTATED RINGERS IV SOLN
INTRAVENOUS | Status: DC | PRN
  Administered 2018-12-27 (×2): via INTRAVENOUS

## 2018-12-27 MED ORDER — FENTANYL CITRATE (PF) 100 MCG/2ML IJ SOLN
INTRAMUSCULAR | Status: DC | PRN
  Administered 2018-12-27 (×5): 50 ug via INTRAVENOUS

## 2018-12-27 MED ORDER — BUPIVACAINE HCL (PF) 0.25 % IJ SOLN
INTRAMUSCULAR | Status: AC
  Filled 2018-12-27: qty 30

## 2018-12-27 MED ORDER — HYDROMORPHONE HCL 1 MG/ML IJ SOLN
0.2500 mg | INTRAMUSCULAR | Status: DC | PRN
  Administered 2018-12-27 (×4): 0.25 mg via INTRAVENOUS

## 2018-12-27 MED ORDER — SODIUM CHLORIDE FLUSH 0.9 % IV SOLN
INTRAVENOUS | Status: AC
  Filled 2018-12-27: qty 10

## 2018-12-27 MED ORDER — SUCCINYLCHOLINE CHLORIDE 20 MG/ML IJ SOLN
INTRAMUSCULAR | Status: DC | PRN
  Administered 2018-12-27: 120 mg via INTRAVENOUS

## 2018-12-27 MED ORDER — ROCURONIUM BROMIDE 50 MG/5ML IV SOLN
INTRAVENOUS | Status: AC
  Filled 2018-12-27: qty 1

## 2018-12-27 MED ORDER — METOPROLOL TARTRATE 5 MG/5ML IV SOLN
INTRAVENOUS | Status: DC | PRN
  Administered 2018-12-27: 5 mg via INTRAVENOUS

## 2018-12-27 MED ORDER — PHENYLEPHRINE HCL-NACL 10-0.9 MG/250ML-% IV SOLN
INTRAVENOUS | Status: DC | PRN
  Administered 2018-12-27: 75 ug/min via INTRAVENOUS

## 2018-12-27 MED ORDER — LIDOCAINE HCL (CARDIAC) PF 100 MG/5ML IV SOSY
PREFILLED_SYRINGE | INTRAVENOUS | Status: DC | PRN
  Administered 2018-12-27: 80 mg via INTRAVENOUS

## 2018-12-27 MED ORDER — SODIUM CHLORIDE 0.9 % IV SOLN
INTRAVENOUS | Status: DC | PRN
  Administered 2018-12-27: 40 mL

## 2018-12-27 MED ORDER — LACTATED RINGERS IV SOLN
INTRAVENOUS | Status: DC | PRN
  Administered 2018-12-27: 16:00:00 via INTRAVENOUS

## 2018-12-27 MED ORDER — KETAMINE HCL 50 MG/ML IJ SOLN
INTRAMUSCULAR | Status: AC
  Filled 2018-12-27: qty 10

## 2018-12-27 MED ORDER — PROPOFOL 10 MG/ML IV BOLUS
INTRAVENOUS | Status: DC | PRN
  Administered 2018-12-27: 150 mg via INTRAVENOUS

## 2018-12-27 MED ORDER — SUGAMMADEX SODIUM 200 MG/2ML IV SOLN
INTRAVENOUS | Status: DC | PRN
  Administered 2018-12-27: 200 mg via INTRAVENOUS

## 2018-12-27 MED ORDER — LIDOCAINE HCL (CARDIAC) PF 100 MG/5ML IV SOSY
PREFILLED_SYRINGE | INTRAVENOUS | Status: DC | PRN
  Administered 2018-12-27: 240 mg via INTRAVENOUS

## 2018-12-27 MED ORDER — PHENYLEPHRINE HCL (PRESSORS) 10 MG/ML IV SOLN
INTRAVENOUS | Status: DC | PRN
  Administered 2018-12-27 (×3): 100 ug via INTRAVENOUS
  Administered 2018-12-27: 200 ug via INTRAVENOUS
  Administered 2018-12-27: 100 ug via INTRAVENOUS
  Administered 2018-12-27: 200 ug via INTRAVENOUS
  Administered 2018-12-27 (×2): 100 ug via INTRAVENOUS

## 2018-12-27 MED ORDER — DEXMEDETOMIDINE HCL 200 MCG/2ML IV SOLN
INTRAVENOUS | Status: DC | PRN
  Administered 2018-12-27: 8 ug via INTRAVENOUS
  Administered 2018-12-27 (×3): 4 ug via INTRAVENOUS

## 2018-12-27 MED ORDER — VASOPRESSIN 20 UNIT/ML IV SOLN
INTRAVENOUS | Status: DC | PRN
  Administered 2018-12-27 (×2): 1 [IU] via INTRAVENOUS

## 2018-12-27 MED ORDER — ROCURONIUM BROMIDE 100 MG/10ML IV SOLN
INTRAVENOUS | Status: DC | PRN
  Administered 2018-12-27 (×2): 20 mg via INTRAVENOUS
  Administered 2018-12-27: 10 mg via INTRAVENOUS
  Administered 2018-12-27 (×2): 20 mg via INTRAVENOUS
  Administered 2018-12-27: 30 mg via INTRAVENOUS
  Administered 2018-12-27: 20 mg via INTRAVENOUS

## 2018-12-27 MED ORDER — ACETAMINOPHEN 10 MG/ML IV SOLN
INTRAVENOUS | Status: DC | PRN
  Administered 2018-12-27: 1000 mg via INTRAVENOUS

## 2018-12-27 MED ORDER — IOHEXOL 9 MG/ML PO SOLN
500.0000 mL | ORAL | Status: AC
  Administered 2018-12-27 (×2): 500 mL via ORAL

## 2018-12-27 MED ORDER — KETAMINE HCL 50 MG/ML IJ SOLN
INTRAMUSCULAR | Status: DC | PRN
  Administered 2018-12-27: 50 mg via INTRAMUSCULAR

## 2018-12-27 MED ORDER — HYDROMORPHONE HCL 1 MG/ML IJ SOLN
INTRAMUSCULAR | Status: AC
  Administered 2018-12-27: 0.25 mg via INTRAVENOUS
  Filled 2018-12-27: qty 1

## 2018-12-27 MED ORDER — METOPROLOL TARTRATE 5 MG/5ML IV SOLN
INTRAVENOUS | Status: AC
  Filled 2018-12-27: qty 5

## 2018-12-27 MED ORDER — BUPIVACAINE HCL (PF) 0.5 % IJ SOLN
INTRAMUSCULAR | Status: AC
  Filled 2018-12-27: qty 30

## 2018-12-27 MED ORDER — MIDAZOLAM HCL 2 MG/2ML IJ SOLN
INTRAMUSCULAR | Status: DC | PRN
  Administered 2018-12-27: 2 mg via INTRAVENOUS

## 2018-12-27 MED ORDER — EPINEPHRINE PF 1 MG/ML IJ SOLN
INTRAMUSCULAR | Status: AC
  Filled 2018-12-27: qty 1

## 2018-12-27 MED ORDER — SODIUM CHLORIDE (PF) 0.9 % IJ SOLN
INTRAMUSCULAR | Status: AC
  Filled 2018-12-27: qty 50

## 2018-12-27 MED ORDER — BUPIVACAINE LIPOSOME 1.3 % IJ SUSP
INTRAMUSCULAR | Status: AC
  Filled 2018-12-27: qty 20

## 2018-12-27 MED ORDER — MIDAZOLAM HCL 2 MG/2ML IJ SOLN
INTRAMUSCULAR | Status: AC
  Filled 2018-12-27: qty 2

## 2018-12-27 MED ORDER — IOHEXOL 300 MG/ML  SOLN
100.0000 mL | Freq: Once | INTRAMUSCULAR | Status: AC | PRN
  Administered 2018-12-27: 100 mL via INTRAVENOUS

## 2018-12-27 MED ORDER — HYDRALAZINE HCL 20 MG/ML IJ SOLN: 10.0000 mg | INTRAMUSCULAR | Status: DC | PRN

## 2018-12-27 SURGICAL SUPPLY — 57 items
APPLICATOR CHLORAPREP 10.5 ORG (MISCELLANEOUS) IMPLANT
BULB RESERV EVAC DRAIN JP 100C (MISCELLANEOUS) ×2 IMPLANT
CANISTER SUCT 1200ML W/VALVE (MISCELLANEOUS) ×2 IMPLANT
CANISTER WOUND CARE 500ML ATS (WOUND CARE) ×2 IMPLANT
CHLORAPREP W/TINT 26 (MISCELLANEOUS) ×2 IMPLANT
COVER WAND RF STERILE (DRAPES) ×2 IMPLANT
DRAIN CHANNEL JP 19F (MISCELLANEOUS) ×2 IMPLANT
DRAPE LAPAROTOMY 100X77 ABD (DRAPES) ×2 IMPLANT
DRAPE LEGGINS SURG 28X43 STRL (DRAPES) IMPLANT
DRAPE UNDER BUTTOCK W/FLU (DRAPES) IMPLANT
DRSG OPSITE POSTOP 4X12 (GAUZE/BANDAGES/DRESSINGS) ×2 IMPLANT
DRSG TEGADERM 4X10 (GAUZE/BANDAGES/DRESSINGS) ×2 IMPLANT
DRSG VAC ATS LRG SENSATRAC (GAUZE/BANDAGES/DRESSINGS) ×2 IMPLANT
ELECT CAUTERY BLADE 6.4 (BLADE) ×2 IMPLANT
ELECT REM PT RETURN 9FT ADLT (ELECTROSURGICAL) ×2
ELECTRODE REM PT RTRN 9FT ADLT (ELECTROSURGICAL) ×1 IMPLANT
GAUZE SPONGE 4X4 12PLY STRL (GAUZE/BANDAGES/DRESSINGS) ×2 IMPLANT
GLOVE SURG SYN 6.5 ES PF (GLOVE) ×8 IMPLANT
GLOVE SURG SYN 7.0 (GLOVE) ×4 IMPLANT
GLOVE SURG SYN 7.5  E (GLOVE) ×2
GLOVE SURG SYN 7.5 E (GLOVE) ×2 IMPLANT
GOWN STRL REUS W/ TWL LRG LVL3 (GOWN DISPOSABLE) ×4 IMPLANT
GOWN STRL REUS W/TWL LRG LVL3 (GOWN DISPOSABLE) ×4
KIT OSTOMY DRAINABLE 2.75 STR (WOUND CARE) ×2 IMPLANT
KIT TURNOVER KIT A (KITS) ×2 IMPLANT
LABEL OR SOLS (LABEL) ×2 IMPLANT
LIGASURE IMPACT 36 18CM CVD LR (INSTRUMENTS) ×2 IMPLANT
NEEDLE HYPO 22GX1.5 SAFETY (NEEDLE) ×2 IMPLANT
NS IRRIG 1000ML POUR BTL (IV SOLUTION) ×16 IMPLANT
PACK BASIN MAJOR ARMC (MISCELLANEOUS) ×2 IMPLANT
PACK COLON CLEAN CLOSURE (MISCELLANEOUS) IMPLANT
SEPRAFILM MEMBRANE 5X6 (MISCELLANEOUS) ×2 IMPLANT
SPONGE KITTNER 5P (MISCELLANEOUS) ×2 IMPLANT
SPONGE LAP 18X18 RF (DISPOSABLE) ×6 IMPLANT
STAPLER CIRCULAR 29MM (STAPLE) IMPLANT
STAPLER CUT CVD 40MM BLUE (STAPLE) IMPLANT
STAPLER PROXIMATE 75MM BLUE (STAPLE) ×2 IMPLANT
STAPLER SKIN PROX 35W (STAPLE) ×2 IMPLANT
STAPLER SYS INTERNAL RELOAD SS (MISCELLANEOUS) IMPLANT
SUT ETHILON 3-0 FS-10 30 BLK (SUTURE) ×2
SUT MNCRL 3-0 UNDYED SH (SUTURE) ×1 IMPLANT
SUT MONOCRYL 3-0 UNDYED (SUTURE) ×1
SUT PDS AB 1 CT1 36 (SUTURE) ×2 IMPLANT
SUT PDS AB 1 TP1 96 (SUTURE) ×4 IMPLANT
SUT PROLENE 0 CT 1 30 (SUTURE) ×6 IMPLANT
SUT PROLENE 2 0 SH DA (SUTURE) ×2 IMPLANT
SUT SILK 2 0 (SUTURE) ×1
SUT SILK 2-0 (SUTURE) ×2 IMPLANT
SUT SILK 2-0 18XBRD TIE 12 (SUTURE) ×1 IMPLANT
SUT SILK 3-0 (SUTURE) ×4 IMPLANT
SUT VIC AB 1 CTX 27 (SUTURE) ×2 IMPLANT
SUT VIC AB 3-0 SH 27 (SUTURE) ×2
SUT VIC AB 3-0 SH 27X BRD (SUTURE) ×2 IMPLANT
SUTURE EHLN 3-0 FS-10 30 BLK (SUTURE) ×1 IMPLANT
SYR 10ML LL (SYRINGE) ×2 IMPLANT
TAPE TRANSPORE STRL 2 31045 (GAUZE/BANDAGES/DRESSINGS) ×2 IMPLANT
TRAY FOLEY MTR SLVR 16FR STAT (SET/KITS/TRAYS/PACK) ×2 IMPLANT

## 2018-12-27 NOTE — Anesthesia Post-op Follow-up Note (Signed)
Anesthesia QCDR form completed.        

## 2018-12-27 NOTE — Transfer of Care (Signed)
Immediate Anesthesia Transfer of Care Note  Patient: Brandon Mullen  Procedure(s) Performed: EXPLORATORY LAPAROTOMY (N/A ) COLECTOMY WITH COLOSTOMY CREATION/HARTMANN PROCEDURE (N/A )  Patient Location: PACU  Anesthesia Type:General  Level of Consciousness: awake  Airway & Oxygen Therapy: Patient connected to face mask oxygen  Post-op Assessment: Post -op Vital signs reviewed and stable  Post vital signs: stable  Last Vitals:  Vitals Value Taken Time  BP 117/70 12/27/18 1838  Temp    Pulse 100 12/27/18 1838  Resp 14 12/27/18 1838  SpO2 95 % 12/27/18 1838  Vitals shown include unvalidated device data.  Last Pain:  Vitals:   12/27/18 1307  TempSrc: Tympanic  PainSc: 0-No pain      Patients Stated Pain Goal: 0 ([id number])  Complications: No apparent anesthesia complications

## 2018-12-27 NOTE — Anesthesia Procedure Notes (Signed)
Procedure Name: Intubation Date/Time: 12/27/2018 1:51 PM Performed by: Lowry Bowl, CRNA Pre-anesthesia Checklist: Patient identified, Emergency Drugs available, Suction available, Patient being monitored and Timeout performed Patient Re-evaluated:Patient Re-evaluated prior to induction Oxygen Delivery Method: Circle system utilized Preoxygenation: Pre-oxygenation with 100% oxygen Induction Type: IV induction, Cricoid Pressure applied and Rapid sequence Ventilation: Mask ventilation without difficulty Laryngoscope Size: McGraph and 4 Grade View: Grade I Tube type: Oral Tube size: 7.5 mm Number of attempts: 1 Airway Equipment and Method: Stylet and Video-laryngoscopy Placement Confirmation: ETT inserted through vocal cords under direct vision,  positive ETCO2 and breath sounds checked- equal and bilateral Secured at: 23 cm Tube secured with: Tape Dental Injury: Teeth and Oropharynx as per pre-operative assessment
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
## 2018-12-27 NOTE — Op Note (Signed)
Procedure Date:  12/27/2018  Pre-operative Diagnosis:  Perforated diverticulitis with abscess  Post-operative Diagnosis:  Perforated diverticulitis with abscess  Procedure:  Exploratory Laparotomy, extensive lysis of adhesions, partial greater omentectomy, sigmoidectomy with end colostomy, wound vac placement.  Surgeon:  Melvyn Neth, MD  Assistant:  Nestor Lewandowsky, MD.  His assistance was needed due to the critical and complex nature of this case.  Also assisting [employer], PA-S.  Anesthesia:  General endotracheal  Estimated Blood Loss:  100 ml  Specimens:  Sigmoid colon, partial omentum.  Complications:  None  Findings:  Patient had significant adhesions from prior splenectomy and incisional hernia repair.  Omentum, small bowel, and splenic flexure were adhered to the abdominal wall.  Patient had a large inflammatory mass from the perforated diverticulitis in the left lower quadrant and some further seropurulent fluid in the abdomen.  One serosal tear of small bowel repaired primarily.  Indications for Procedure:  This is a 43 y.o. male who presents with worsening diverticulitis with abscess, not improving after percutaneous drainage.  Discussed with patient that he required Hartmann's procedure.  The risks of bleeding, abscess or infection, injury to surrounding structures, and need for further procedures were all discussed with the patient and was willing to proceed.  Description of Procedure: The patient was correctly identified in the preoperative area and brought into the operating room.  The patient was placed supine with VTE prophylaxis in place.  Appropriate time-outs were performed.  Anesthesia was induced and the patient was intubated.  Foley catheter was placed.  Appropriate antibiotics were infused.  The abdomen was prepped and draped in a sterile fashion.  A midline incision was made and electrocautery was used to dissect down the subcutaneous tissue to the fascia.   The fascia was incised and careful dissection was used to go through the previously inserted mesh and through the peritoneum to get into the abdominal cavity.  The incision was then extended superiorly and inferiorly.  The abdomen was explored revealing seropurulent fluid in the mid abdomen and extensive adhesions.  Adhesions were taken down between omentum and abdominal wall, small bowel loops, and between small bowel and abdominal wall.  There was extensive lysis of adhesions for over 90 minutes in order to be able to mobilize the entire small bowel.  There was one serosal tear created during dissection which was repaired primarily with 3-0 silk sutures.  As part of the mobilization, part of the greater omentum had to be resected.  Once the small bowel was fully mobilized, contents were milked back from the terminal ileum up to the ligament of treitz and onto the stomach to the NG tube.  700 ml of fluid was suctioned through the NG tube.  The bowel was then mobilized and the sigmoid colon was identified.  The patient had a large inflammatory mass in the left lower quadrant.  Blunt dissection was used to get into the abscess cavity.  After this, the sigmoid colon was mobilized off the abdominal wall along the white line of Toldt, going from the pelvis to the left upper quadrant in the area of the splenic flexure.  We identified the left iliac vessels which were intact.  Left ureter was not visualized but there was no other structure in the mesentery of the sigmoid colon.  We created windows in the mesentery both proximally and distally and resected the sigmoid colon proximally using GIA blue load and distally using Contour blue load stapler.  The mesentery was cauterized with  the LigaSure.  Specimen was sent off the field.  The rectal stump was marked with two 2-0 Prolene sutures.  We then created an incision in the left side of the abdomen lateral to rectus to avoid previously inserted mesh and brought the  proximal portion of the colon through it for our ostomy creation.  The abdomen was then thoroughly irrigated with warm saline in all quadrants.  A 19 Fr. Blake drain was placed from the right lower quadrant going to the pelvis and secured in place with 3-0 Nylon.  The midline incision was then infiltrated using Exparel solution, and then it was closed using #1 PDS sutures.  The midline wound was then irrigated and then dressed with a wound vac black sponge dressing.  Then the end colostomy was matured in standard fashion without complications.  The mucosa appeared viable with some congestion only.  Ostomy appliance was placed.  Drain was dressed with 4x4 gauze and Tegaderm.  The patient was emerged from anesthesia and extubated and brought to the recovery room for further management.  The patient tolerated the procedure well and all counts were correct at the end of the case.   Melvyn Neth, MD

## 2018-12-27 NOTE — Progress Notes (Signed)
[facility] Hospital Day(s): 2.   Interval History: Patient seen and examined, no acute events or new complaints overnight. Patient reports that he continues to notice lower abdominal pain, worse in his suprapubic region, however he is now noticing more diffuse tenderness. He continues to endorse distension. No fever, chills, nausea, or emesis. Still with flatus but no BM. He is tachycardic this morning and has had a worsening of his leukocytosis to 36K.   Vital signs in last 24 hours: [min-max] current  Temp:  [98.1 F (36.7 C)-98.8 F (37.1 C)] 98.3 F (36.8 C) (10/28 0445) Pulse Rate:  [97-113] 107 (10/28 0445) Resp:  [20-26] 20 (10/28 0445) BP: (117-135)/(70-94) 135/94 (10/28 0445) SpO2:  [92 %-100 %] 93 % (10/28 0445)     Height: 5\' 8"  (172.7 cm) Weight: 104.3 kg BMI (Calculated): 34.98   Intake/Output last 2 shifts:  10/27 0701 - 10/28 0700 In: 675.5 [P.O.:30; I.V.:585.4; IV Piggyback:50.1] Out: 20 [Drains:20]   Physical Exam:  Constitutional: alert, cooperative and no distress  Respiratory: breathing non-labored at rest  Cardiovascular: tachycardic and sinus rhythm  Gastrointestinal: More firm, distended, and tender to palpation throughout (worse in suprapubic - LLQ), no rebound. JP in suprapubic region with serosanguinous output.  Integumentary: Warm, dry.   Labs:  CBC Latest Ref Rng & Units 12/27/2018 12/26/2018 12/25/2018  WBC 4.0 - 10.5 K/uL 36.1(H) 32.9(H) 28.5(H)  Hemoglobin 13.0 - 17.0 g/dL 13.2 14.3 15.5  Hematocrit 39.0 - 52.0 % 39.5 41.7 46.5  Platelets 150 - 400 K/uL 465(H) 498(H) 530(H)   CMP Latest Ref Rng & Units 12/27/2018 12/26/2018 12/25/2018  Glucose 70 - 99 mg/dL 103(H) 125(H) 111(H)  BUN 6 - 20 mg/dL 27(H) 24(H) 15  Creatinine 0.61 - 1.24 mg/dL 0.96 0.93 0.75  Sodium 135 - 145 mmol/L 140 138 136  Potassium 3.5 - 5.1 mmol/L 4.5 4.9 4.4  Chloride 98 - 111 mmol/L 105 106 99  CO2 22 - 32 mmol/L 26 23 23    Calcium 8.9 - 10.3 mg/dL 8.7(L) 8.8(L) 9.3  Total Protein 6.5 - 8.1 g/dL - - 7.4  Total Bilirubin 0.3 - 1.2 mg/dL - - 0.9  Alkaline Phos 38 - 126 U/L - - 110  AST 15 - 41 U/L - - 22  ALT 0 - 44 U/L - - 39     Imaging studies: No new pertinent imaging studies   Assessment/Plan: (ICD-10's: K2.80) 43 y.o. male who is not clinically improving/progressing as expected with continued, now diffuse, abdominal pain, increasing leukocytosis, and tachycardia admitted with complicated diverticulitis with abscess s/p drain placement.    - He continues to not improve x3 days with worsening of labs/vitals. At this point, we will likely proceed with Hartman's Procedure this afternoon pending OR/anesthesia availability, which he understands. We will obtain repeat CT Abdomen/Pelvis to reassess abdomen prior to proceeding to the OR  - All risks, benefits, and alternatives to above procedure(s) were discussed with the patient, all of his questions were answered to his expressed satisfaction, patient expresses he wishes to proceed, and informed consent was obtained.   - Continue NPO + IVF - IV ABx (Zosyn -->day 2) - pain control prn; antiemetics prn - monitor abdominal examination              - Monitor drain output   - Mobilization encouraged - medical management of comorbidities: home meds - DVT prophylaxis; HOLD for possible OR this afternoon  All of the above findings and recommendations were  discussed with the patient, and the medical team, and all of patient's questions were answered to his expressed satisfaction.  -- Edison Simon, PA-C [facility] 12/27/2018, 8:20 AM [phone] M-F: 7am - 4pm

## 2018-12-28 ENCOUNTER — Encounter: Payer: Self-pay | Admitting: Surgery

## 2018-12-28 LAB — CBC
HCT: 37.3 % — ABNORMAL LOW (ref 39.0–52.0)
Hemoglobin: 12.8 g/dL — ABNORMAL LOW (ref 13.0–17.0)
MCH: 30.9 pg (ref 26.0–34.0)
MCHC: 34.3 g/dL (ref 30.0–36.0)
MCV: 90.1 fL (ref 80.0–100.0)
Platelets: 479 10*3/uL — ABNORMAL HIGH (ref 150–400)
RBC: 4.14 MIL/uL — ABNORMAL LOW (ref 4.22–5.81)
RDW: 14.6 % (ref 11.5–15.5)
WBC: 35.1 10*3/uL — ABNORMAL HIGH (ref 4.0–10.5)
nRBC: 0 % (ref 0.0–0.2)

## 2018-12-28 LAB — BASIC METABOLIC PANEL
Anion gap: 11 (ref 5–15)
BUN: 26 mg/dL — ABNORMAL HIGH (ref 6–20)
CO2: 27 mmol/L (ref 22–32)
Calcium: 8.5 mg/dL — ABNORMAL LOW (ref 8.9–10.3)
Chloride: 102 mmol/L (ref 98–111)
Creatinine, Ser: 0.83 mg/dL (ref 0.61–1.24)
GFR calc Af Amer: >60 mL/min (ref >60)
GFR calc non Af Amer: >60 mL/min (ref >60)
Glucose, Bld: 141 mg/dL — ABNORMAL HIGH (ref 70–99)
Potassium: 4.2 mmol/L (ref 3.5–5.1)
Sodium: 140 mmol/L (ref 135–145)

## 2018-12-28 LAB — MAGNESIUM: Magnesium: 2.2 mg/dL (ref 1.7–2.4)

## 2018-12-28 LAB — ABO/RH: ABO/RH(D): O NEG

## 2018-12-28 MED ORDER — ENOXAPARIN SODIUM 40 MG/0.4ML ~~LOC~~ SOLN
40.0000 mg | SUBCUTANEOUS | Status: DC
  Administered 2018-12-28 – 2019-01-09 (×13): 40 mg via SUBCUTANEOUS
  Filled 2018-12-28 (×13): qty 0.4

## 2018-12-28 NOTE — Progress Notes (Signed)
[facility] Hospital Day(s): 3.   Post op day(s): 1 Day Post-Op.   Interval History: Patient seen and examined, no acute events or new complaints overnight. Patient reports he is doing okay, he notes that his pain has improved and he is just now diffusely sore. No fever, chills, nausea, or emesis. NGT with ~700 out. He does endorse flatus from colostomy. JP with 110 out, serosanguinous. Wound vac working. Leukocytosis improved to 35K.   Vital signs in last 24 hours: [min-max] current  Temp:  [97.2 F (36.2 C)-98.5 F (36.9 C)] 98.5 F (36.9 C) (10/29 0409) Pulse Rate:  [90-104] 100 (10/29 0409) Resp:  [0-20] 16 (10/29 0409) BP: (117-152)/(70-93) 122/89 (10/29 0409) SpO2:  [89 %-96 %] 91 % (10/29 0409) Weight:  [104 kg] 104 kg (10/28 1307)     Height: 5\' 8"  (172.7 cm) Weight: 104 kg BMI (Calculated): 34.87   Intake/Output last 2 shifts:  10/28 0701 - 10/29 0700 In: 3300 [I.V.:3300] Out: 2100 [Urine:1140; Drains:160; Blood:100]   Physical Exam:  Constitutional: alert, cooperative and no distress HEENT: NGT in palce  Respiratory: breathing non-labored at rest  Cardiovascular: regular rate and sinus rhythm  Gastrointestinal: soft, diffuse soreness, and non-distended, JP in RLQ with serosanguinous output Integumentary: Wound vac to midline wound, good seal, serosanguinous  Labs:  CBC Latest Ref Rng & Units 12/27/2018 12/26/2018 12/25/2018  WBC 4.0 - 10.5 K/uL 36.1(H) 32.9(H) 28.5(H)  Hemoglobin 13.0 - 17.0 g/dL 13.2 14.3 15.5  Hematocrit 39.0 - 52.0 % 39.5 41.7 46.5  Platelets 150 - 400 K/uL 465(H) 498(H) 530(H)   CMP Latest Ref Rng & Units 12/27/2018 12/26/2018 12/25/2018  Glucose 70 - 99 mg/dL 103(H) 125(H) 111(H)  BUN 6 - 20 mg/dL 27(H) 24(H) 15  Creatinine 0.61 - 1.24 mg/dL 0.96 0.93 0.75  Sodium 135 - 145 mmol/L 140 138 136  Potassium 3.5 - 5.1 mmol/L 4.5 4.9 4.4  Chloride 98 - 111 mmol/L 105 106 99  CO2 22 - 32 mmol/L 26 23 23    Calcium 8.9 - 10.3 mg/dL 8.7(L) 8.8(L) 9.3  Total Protein 6.5 - 8.1 g/dL - - 7.4  Total Bilirubin 0.3 - 1.2 mg/dL - - 0.9  Alkaline Phos 38 - 126 U/L - - 110  AST 15 - 41 U/L - - 22  ALT 0 - 44 U/L - - 39     Imaging studies: No new pertinent imaging studies   Assessment/Plan:  43 y.o. male overall doing well 1 Day Post-Op s/p Hartman's for complicated diverticulitis with abscess that failed to clinically improve despite IV Abx and percutaneous drainage   - NPO (ice chips okay) + IVF  - Continue NGT decompression for now; monitor output  - pain control prn; antiemetics prn  - monitor abdominal examination; colostomy output  - discontinue foley catheter  - mobilize   - medical management of comorbidities   - Restart DVT prophylaxis tonight   All of the above findings and recommendations were discussed with the patient, and the medical team, and all of patient's questions were answered to his expressed satisfaction.  -- Edison Simon, PA-C [facility] 12/28/2018, 7:16 AM [phone] M-F: 7am - 4pm

## 2018-12-29 LAB — TYPE AND SCREEN
ABO/RH(D): O NEG
Antibody Screen: POSITIVE
Unit division: 0
Unit division: 0

## 2018-12-29 LAB — BPAM RBC
Blood Product Expiration Date: 202011282359
Blood Product Expiration Date: 202011282359
Unit Type and Rh: 9500
Unit Type and Rh: 9500

## 2018-12-29 LAB — BASIC METABOLIC PANEL
Anion gap: 16 — ABNORMAL HIGH (ref 5–15)
BUN: 33 mg/dL — ABNORMAL HIGH (ref 6–20)
CO2: 29 mmol/L (ref 22–32)
Calcium: 8.6 mg/dL — ABNORMAL LOW (ref 8.9–10.3)
Chloride: 99 mmol/L (ref 98–111)
Creatinine, Ser: 0.98 mg/dL (ref 0.61–1.24)
GFR calc Af Amer: >60 mL/min (ref >60)
GFR calc non Af Amer: >60 mL/min (ref >60)
Glucose, Bld: 90 mg/dL (ref 70–99)
Potassium: 3.7 mmol/L (ref 3.5–5.1)
Sodium: 144 mmol/L (ref 135–145)

## 2018-12-29 LAB — CBC
HCT: 36.3 % — ABNORMAL LOW (ref 39.0–52.0)
Hemoglobin: 12.4 g/dL — ABNORMAL LOW (ref 13.0–17.0)
MCH: 30.6 pg (ref 26.0–34.0)
MCHC: 34.2 g/dL (ref 30.0–36.0)
MCV: 89.6 fL (ref 80.0–100.0)
Platelets: 479 10*3/uL — ABNORMAL HIGH (ref 150–400)
RBC: 4.05 MIL/uL — ABNORMAL LOW (ref 4.22–5.81)
RDW: 14.3 % (ref 11.5–15.5)
WBC: 24.8 10*3/uL — ABNORMAL HIGH (ref 4.0–10.5)
nRBC: 0.2 % (ref 0.0–0.2)

## 2018-12-29 LAB — SURGICAL PATHOLOGY

## 2018-12-29 LAB — MAGNESIUM: Magnesium: 2.2 mg/dL (ref 1.7–2.4)

## 2018-12-29 MED ORDER — KCL IN DEXTROSE-NACL 20-5-0.45 MEQ/L-%-% IV SOLN
INTRAVENOUS | Status: AC
  Administered 2018-12-29 – 2019-01-02 (×8): via INTRAVENOUS
  Filled 2018-12-29 (×12): qty 1000

## 2018-12-29 MED ORDER — CYCLOBENZAPRINE HCL 10 MG PO TABS
5.0000 mg | ORAL_TABLET | Freq: Three times a day (TID) | ORAL | Status: DC | PRN
  Administered 2018-12-29 – 2019-01-10 (×6): 5 mg via ORAL
  Filled 2018-12-29 (×6): qty 1

## 2018-12-29 MED ORDER — ACETAMINOPHEN 325 MG PO TABS
650.0000 mg | ORAL_TABLET | Freq: Four times a day (QID) | ORAL | Status: DC | PRN
  Filled 2018-12-29: qty 2

## 2018-12-29 MED ORDER — OXYCODONE HCL 5 MG PO TABS
5.0000 mg | ORAL_TABLET | ORAL | Status: DC | PRN
  Administered 2018-12-29 – 2019-01-03 (×22): 10 mg via ORAL
  Administered 2019-01-03: 5 mg via ORAL
  Administered 2019-01-03 – 2019-01-06 (×14): 10 mg via ORAL
  Administered 2019-01-06: 5 mg via ORAL
  Administered 2019-01-07 – 2019-01-10 (×19): 10 mg via ORAL
  Filled 2018-12-29 (×53): qty 2
  Filled 2018-12-29: qty 1
  Filled 2018-12-29 (×4): qty 2

## 2018-12-29 NOTE — Progress Notes (Signed)
Pharmacy Antibiotic Note  Brandon Mullen is a 43 y.o. male admitted on 12/25/2018 with Intra-abdominal infection.  Pharmacy has been consulted for Zosyn dosing.  Plan: Continue Zosyn 3.375g IV q8h (4 hour infusion).  Height: 5\' 8"  (172.7 cm) Weight: 229 lb 4.5 oz (104 kg) IBW/kg (Calculated) : 68.4  Temp (24hrs), Avg:97.9 F (36.6 C), Min:97.7 F (36.5 C), Max:98.1 F (36.7 C)  Recent Labs  Lab 12/25/18 0602 12/26/18 0453 12/27/18 0500 12/28/18 0739 12/29/18 0658  WBC 28.5* 32.9* 36.1* 35.1* 24.8*  CREATININE 0.75 0.93 0.96 0.83 0.98    Estimated Creatinine Clearance: 111.2 mL/min (by C-G formula based on SCr of 0.98 mg/dL).    Allergies  Allergen Reactions  . Chlorhexidine     Discontinue catheter     Antimicrobials this admission: Zosyn 10/26 >>   Thank you for allowing pharmacy to be a part of this patient's care.  Lu Duffel, PharmD, BCPS Clinical Pharmacist 12/29/2018 9:35 AM

## 2018-12-29 NOTE — Progress Notes (Signed)
[facility] Hospital Day(s): 4.   Post op day(s): 2 Days Post-Op.   Interval History: Patient seen and examined, no acute events or new complaints overnight. Patient reports he continues to feel better this morning. Still with abdominal soreness diffusely. No fever, chills, nausea, or emesis. He report faint amount of gas in colostomy. No stool. NGT with 3.5L although he reports having 2 cups of ice water per cannister. Wound vac and JP drain with serosanguinous output. Leukocytosis improved.    Vital signs in last 24 hours: [min-max] current  Temp:  [97.7 F (36.5 C)-98.1 F (36.7 C)] 98.1 F (36.7 C) (10/30 0509) Pulse Rate:  [97-100] 98 (10/30 0509) Resp:  [20] 20 (10/30 0509) BP: (124-148)/(74-96) 148/96 (10/30 0509) SpO2:  [91 %-94 %] 92 % (10/30 0509)     Height: 5\' 8"  (172.7 cm) Weight: 104 kg BMI (Calculated): 34.87   Intake/Output last 2 shifts:  10/29 0701 - 10/30 0700 In: 4388.6 [I.V.:3972.5; IV Piggyback:416.1] Out: 4020 [Urine:300; Emesis/NG output:3550; Drains:135; Stool:35]   Physical Exam:  Constitutional: alert, cooperative and no distress HEENT: NGT in palce  Respiratory: breathing non-labored at rest  Cardiovascular: regular rate and sinus rhythm  Gastrointestinal: soft, diffuse soreness, and non-distended, JP in RLQ with serosanguinous output Integumentary: Wound vac to midline wound, good seal, serosanguinous   Labs:  CBC Latest Ref Rng & Units 12/28/2018 12/27/2018 12/26/2018  WBC 4.0 - 10.5 K/uL 35.1(H) 36.1(H) 32.9(H)  Hemoglobin 13.0 - 17.0 g/dL 12.8(L) 13.2 14.3  Hematocrit 39.0 - 52.0 % 37.3(L) 39.5 41.7  Platelets 150 - 400 K/uL 479(H) 465(H) 498(H)   CMP Latest Ref Rng & Units 12/28/2018 12/27/2018 12/26/2018  Glucose 70 - 99 mg/dL 141(H) 103(H) 125(H)  BUN 6 - 20 mg/dL 26(H) 27(H) 24(H)  Creatinine 0.61 - 1.24 mg/dL 0.83 0.96 0.93  Sodium 135 - 145 mmol/L 140 140 138  Potassium 3.5 - 5.1 mmol/L 4.2  4.5 4.9  Chloride 98 - 111 mmol/L 102 105 106  CO2 22 - 32 mmol/L 27 26 23   Calcium 8.9 - 10.3 mg/dL 8.5(L) 8.7(L) 8.8(L)  Total Protein 6.5 - 8.1 g/dL - - -  Total Bilirubin 0.3 - 1.2 mg/dL - - -  Alkaline Phos 38 - 126 U/L - - -  AST 15 - 41 U/L - - -  ALT 0 - 44 U/L - - -     Imaging studies: No new pertinent imaging studies   Assessment/Plan:  43 y.o. male Overall doing well with improving leukocytosis and high output NGT although I think this is falsified with PO intake 2 Days Post-Op s/p Hartman's for complicated diverticulitis with abscess that failed to clinically improve despite IV Abx and percutaneous drainage   - Okay for ice chips - discussed limiting intake as it is falsifying output   - Continue NGT decompression for now; monitor output             - pain control prn; antiemetics prn             - monitor abdominal examination; colostomy output             - discontinue foley catheter             - mobilize              - medical management of comorbidities              - Restart DVT prophylaxis tonight   All  of the above findings and recommendations were discussed with the patient, and the medical team, and all of patient's questions were answered to his expressed satisfaction.  -- Edison Simon, PA-C [facility] 12/29/2018, 7:37 AM [phone] M-F: 7am - 4pm

## 2018-12-29 NOTE — Consult Note (Addendum)
[facility] Nurse ostomy consult note Surgical team following for assessment and plan of care for abd wound.  Pt has a Vac in place and plans to go to the OR today for a dressing change, according to bedside nurse.   Stoma type/location:  Colostomy surgery performed 10/28.  Stoma is red and viable, above skin level.  Current pouch was beginning to leak behind the barrier.  Stomal assessment/size:  1 1/2 inches Peristomal assessment:  Intact skin surrounding Output: No stool or flatus, scant amt pink liquid in pouch  Ostomy pouching: 2pc.  Education provided:  Demonstrated pouch change using barrier ring to attempt to maintain a seal and 2 piece pouch.  Pt stood and looked in the mirror to watch the process. He was able to open and close velcro to empty.  Educational materials left at the bedside with 3 extra barrier rings, wafers, and pouches.  Reviewed ordering supplies and pouching routines.  Enrolled patient in [place] program: Yes [place] team will continue to follow for further teaching sessions.  Julien Girt MSN, RN, [place], [place], [place]

## 2018-12-29 NOTE — Plan of Care (Signed)
Encouraged patient to ambulate to assist with bowel motility and reduce abdominal pain. Patient continuously requests dilaudid for pain medication.

## 2018-12-30 LAB — BASIC METABOLIC PANEL
Anion gap: 9 (ref 5–15)
BUN: 20 mg/dL (ref 6–20)
CO2: 31 mmol/L (ref 22–32)
Calcium: 8.4 mg/dL — ABNORMAL LOW (ref 8.9–10.3)
Chloride: 101 mmol/L (ref 98–111)
Creatinine, Ser: 0.82 mg/dL (ref 0.61–1.24)
GFR calc Af Amer: >60 mL/min (ref >60)
GFR calc non Af Amer: >60 mL/min (ref >60)
Glucose, Bld: 134 mg/dL — ABNORMAL HIGH (ref 70–99)
Potassium: 3.6 mmol/L (ref 3.5–5.1)
Sodium: 141 mmol/L (ref 135–145)

## 2018-12-30 LAB — CULTURE, BLOOD (ROUTINE X 2)
Culture: NO GROWTH
Culture: NO GROWTH

## 2018-12-30 LAB — CBC
HCT: 36.5 % — ABNORMAL LOW (ref 39.0–52.0)
Hemoglobin: 12.6 g/dL — ABNORMAL LOW (ref 13.0–17.0)
MCH: 30.9 pg (ref 26.0–34.0)
MCHC: 34.5 g/dL (ref 30.0–36.0)
MCV: 89.5 fL (ref 80.0–100.0)
Platelets: 499 10*3/uL — ABNORMAL HIGH (ref 150–400)
RBC: 4.08 MIL/uL — ABNORMAL LOW (ref 4.22–5.81)
RDW: 14.5 % (ref 11.5–15.5)
WBC: 24.8 10*3/uL — ABNORMAL HIGH (ref 4.0–10.5)
nRBC: 1.5 % — ABNORMAL HIGH (ref 0.0–0.2)

## 2018-12-30 NOTE — Progress Notes (Addendum)
Subjective:  CC: Brandon Mullen is a 43 y.o. male  Hospital stay day 5, 3 Days Post-Op Hartman's for diverticulitis  HPI: No acute issues overnight.  States he has noticed some increased gas within the ostomy bag but no obvious bowel movement.  ROS:  General: Denies weight loss, weight gain, fatigue, fevers, chills, and night sweats. Heart: Denies chest pain, palpitations, racing heart, irregular heartbeat, leg pain or swelling, and decreased activity tolerance. Respiratory: Denies breathing difficulty, shortness of breath, wheezing, cough, and sputum. GI: Denies change in appetite, heartburn, nausea, vomiting, constipation, diarrhea, and blood in stool. GU: Denies difficulty urinating, pain with urinating, urgency, frequency, blood in urine.   Objective:   Temp:  [98.3 F (36.8 C)] 98.3 F (36.8 C) (10/31 0619) Pulse Rate:  [93-102] 93 (10/31 0619) Resp:  [16] 16 (10/31 0619) BP: (146)/(91-99) 146/99 (10/31 0619) SpO2:  [93 %-95 %] 93 % (10/31 0619)     Height: 5\' 8"  (172.7 cm) Weight: 104 kg BMI (Calculated): 34.87   Intake/Output this shift:   Intake/Output Summary (Last 24 hours) at 12/30/2018 1520 Last data filed at 12/30/2018 1157 Gross per 24 hour  Intake 1598.6 ml  Output 4475 ml  Net -2876.4 ml    Constitutional :  alert, cooperative, appears stated age and no distress  Respiratory:  clear to auscultation bilaterally  Cardiovascular:  regular rate and rhythm  Gastrointestinal: Soft, no guarding, tenderness around incision site as expected.  NG with bilious output.  JP with minimal serosanguineous drainage.Darlyne Russian with serosanguineous drainage  Skin: Cool and moist.  Incision clean dry and intact  Psychiatric: Normal affect, non-agitated, not confused       LABS:  CMP Latest Ref Rng & Units 12/30/2018 12/29/2018 12/28/2018  Glucose 70 - 99 mg/dL 134(H) 90 141(H)  BUN 6 - 20 mg/dL 20 33(H) 26(H)  Creatinine 0.61 - 1.24 mg/dL 0.82 0.98 0.83  Sodium 135 - 145  mmol/L 141 144 140  Potassium 3.5 - 5.1 mmol/L 3.6 3.7 4.2  Chloride 98 - 111 mmol/L 101 99 102  CO2 22 - 32 mmol/L 31 29 27   Calcium 8.9 - 10.3 mg/dL 8.4(L) 8.6(L) 8.5(L)  Total Protein 6.5 - 8.1 g/dL - - -  Total Bilirubin 0.3 - 1.2 mg/dL - - -  Alkaline Phos 38 - 126 U/L - - -  AST 15 - 41 U/L - - -  ALT 0 - 44 U/L - - -   CBC Latest Ref Rng & Units 12/30/2018 12/29/2018 12/28/2018  WBC 4.0 - 10.5 K/uL 24.8(H) 24.8(H) 35.1(H)  Hemoglobin 13.0 - 17.0 g/dL 12.6(L) 12.4(L) 12.8(L)  Hematocrit 39.0 - 52.0 % 36.5(L) 36.3(L) 37.3(L)  Platelets 150 - 400 K/uL 499(H) 479(H) 479(H)    RADS: n/a Assessment:   Status post Hartman's.  Now reported some gas in the ostomy bag.  NG output is still high but partial at this could be from his ice chip intake.  Recommend that we clamp his NG tube and monitor for now.  Gum and hard candy is okay, along with continuing ice chips.  If bowel movement is noted in bag, or patient tolerates a clamp trial for the next 24 hours, will remove tube and start diet.  We will likely remove the JP drain in the next day or 2 as well.

## 2018-12-31 ENCOUNTER — Inpatient Hospital Stay: Payer: BC Managed Care – PPO

## 2018-12-31 LAB — AEROBIC/ANAEROBIC CULTURE W GRAM STAIN (SURGICAL/DEEP WOUND)

## 2018-12-31 LAB — CBC WITH DIFFERENTIAL/PLATELET
Abs Immature Granulocytes: 2.59 10*3/uL — ABNORMAL HIGH (ref 0.00–0.07)
Basophils Absolute: 0.1 10*3/uL (ref 0.0–0.1)
Basophils Relative: 0 %
Eosinophils Absolute: 1.5 10*3/uL — ABNORMAL HIGH (ref 0.0–0.5)
Eosinophils Relative: 5 %
HCT: 37.5 % — ABNORMAL LOW (ref 39.0–52.0)
Hemoglobin: 12.4 g/dL — ABNORMAL LOW (ref 13.0–17.0)
Immature Granulocytes: 9 %
Lymphocytes Relative: 8 %
Lymphs Abs: 2.4 10*3/uL (ref 0.7–4.0)
MCH: 30.1 pg (ref 26.0–34.0)
MCHC: 33.1 g/dL (ref 30.0–36.0)
MCV: 91 fL (ref 80.0–100.0)
Monocytes Absolute: 2.9 10*3/uL — ABNORMAL HIGH (ref 0.1–1.0)
Monocytes Relative: 10 %
Neutro Abs: 19.4 10*3/uL — ABNORMAL HIGH (ref 1.7–7.7)
Neutrophils Relative %: 68 %
Platelets: 539 10*3/uL — ABNORMAL HIGH (ref 150–400)
RBC: 4.12 MIL/uL — ABNORMAL LOW (ref 4.22–5.81)
RDW: 14.6 % (ref 11.5–15.5)
Smear Review: NORMAL
WBC: 28.9 10*3/uL — ABNORMAL HIGH (ref 4.0–10.5)
nRBC: 2.1 % — ABNORMAL HIGH (ref 0.0–0.2)

## 2018-12-31 LAB — BASIC METABOLIC PANEL
Anion gap: 11 (ref 5–15)
BUN: 15 mg/dL (ref 6–20)
CO2: 29 mmol/L (ref 22–32)
Calcium: 8.5 mg/dL — ABNORMAL LOW (ref 8.9–10.3)
Chloride: 99 mmol/L (ref 98–111)
Creatinine, Ser: 0.83 mg/dL (ref 0.61–1.24)
GFR calc Af Amer: >60 mL/min (ref >60)
GFR calc non Af Amer: >60 mL/min (ref >60)
Glucose, Bld: 109 mg/dL — ABNORMAL HIGH (ref 70–99)
Potassium: 4 mmol/L (ref 3.5–5.1)
Sodium: 139 mmol/L (ref 135–145)

## 2018-12-31 LAB — PHOSPHORUS: Phosphorus: 3.5 mg/dL (ref 2.5–4.6)

## 2018-12-31 LAB — MAGNESIUM: Magnesium: 2 mg/dL (ref 1.7–2.4)

## 2018-12-31 MED ORDER — PANTOPRAZOLE SODIUM 40 MG IV SOLR
40.0000 mg | Freq: Every day | INTRAVENOUS | Status: DC
  Administered 2018-12-31 – 2019-01-10 (×11): 40 mg via INTRAVENOUS
  Filled 2018-12-31 (×11): qty 40

## 2018-12-31 MED ORDER — PHENOL 1.4 % MT LIQD
1.0000 | OROMUCOSAL | Status: DC | PRN
  Filled 2018-12-31: qty 177

## 2018-12-31 MED ORDER — HYDROMORPHONE HCL 1 MG/ML IJ SOLN: 2.0000 mg | INTRAMUSCULAR | Status: DC | PRN

## 2018-12-31 MED ORDER — HYDROMORPHONE HCL 1 MG/ML IJ SOLN: 0.5000 mg | INTRAMUSCULAR | Status: DC | PRN

## 2018-12-31 MED ORDER — HYDROMORPHONE HCL 1 MG/ML IJ SOLN
INTRAMUSCULAR | Status: AC
  Filled 2018-12-31: qty 1

## 2018-12-31 NOTE — Progress Notes (Signed)
Subjective:  CC: Brandon Mullen is a 43 y.o. male  Hospital stay day 6, 4 Days Post-Op Hartman's for diverticulitis  HPI: Documentation of 10 out of 10 pain per nurse overnight.  Patient however denies any acute issues overnight.  States he has noticed more gas in the bag  ROS:  General: Denies weight loss, weight gain, fatigue, fevers, chills, and night sweats. Heart: Denies chest pain, palpitations, racing heart, irregular heartbeat, leg pain or swelling, and decreased activity tolerance. Respiratory: Denies breathing difficulty, shortness of breath, wheezing, cough, and sputum. GI: Denies change in appetite, heartburn, nausea, vomiting, constipation, diarrhea, and blood in stool. GU: Denies difficulty urinating, pain with urinating, urgency, frequency, blood in urine.   Objective:   Temp:  [98.4 F (36.9 C)-99.1 F (37.3 C)] 98.4 F (36.9 C) (11/01 0517) Pulse Rate:  [96-100] 96 (11/01 0517) Resp:  [20] 20 (11/01 0517) BP: (128-149)/(87-96) 149/96 (11/01 0517) SpO2:  [95 %] 95 % (11/01 0517)     Height: 5\' 8"  (172.7 cm) Weight: 104 kg BMI (Calculated): 34.87   Intake/Output this shift:   Intake/Output Summary (Last 24 hours) at 12/31/2018 0912 Last data filed at 12/31/2018 0803 Gross per 24 hour  Intake 2268.24 ml  Output 4255 ml  Net -1986.76 ml  NG now with more darker bilious output  Constitutional :  alert, cooperative, appears stated age and no distress  Respiratory:  clear to auscultation bilaterally  Cardiovascular:  regular rate and rhythm  Gastrointestinal: Soft, no guarding, tenderness around incision site as expected.  NG with bilious output.  JP with minimal serosanguineous drainage.Marland Kitchen  ABThera with serosanguineous drainage  Skin: Cool and moist.  Incision clean dry and intact  Psychiatric: Normal affect, non-agitated, not confused       LABS:  CMP Latest Ref Rng & Units 12/31/2018 12/30/2018 12/29/2018  Glucose 70 - 99 mg/dL 109(H) 134(H) 90  BUN 6 - 20  mg/dL 15 20 33(H)  Creatinine 0.61 - 1.24 mg/dL 0.83 0.82 0.98  Sodium 135 - 145 mmol/L 139 141 144  Potassium 3.5 - 5.1 mmol/L 4.0 3.6 3.7  Chloride 98 - 111 mmol/L 99 101 99  CO2 22 - 32 mmol/L 29 31 29   Calcium 8.9 - 10.3 mg/dL 8.5(L) 8.4(L) 8.6(L)  Total Protein 6.5 - 8.1 g/dL - - -  Total Bilirubin 0.3 - 1.2 mg/dL - - -  Alkaline Phos 38 - 126 U/L - - -  AST 15 - 41 U/L - - -  ALT 0 - 44 U/L - - -   CBC Latest Ref Rng & Units 12/31/2018 12/30/2018 12/29/2018  WBC 4.0 - 10.5 K/uL 28.9(H) 24.8(H) 24.8(H)  Hemoglobin 13.0 - 17.0 g/dL 12.4(L) 12.6(L) 12.4(L)  Hematocrit 39.0 - 52.0 % 37.5(L) 36.5(L) 36.3(L)  Platelets 150 - 400 K/uL 539(H) 499(H) 479(H)    RADS: n/a Assessment:   Status post Hartman's.  No significant increase in ostomy output, no significant gas noted in bag this morning.  Still with large amounts of bilious output from NG, which is dark and now maroon.  Failed clamp trial, and the output likely is not due to the ice chips intake he is stating due to the dark-colored nature.  We will add Protonix for GI prophylaxis, hemoglobin remains  Stable.  Continue NG decompression until actual stool noted in ostomy.  Patient also noted to have increased redness surrounding former Lovenox injection site.  No obvious induration or tenderness around the area.  Could potentially be a resolving hematoma,  versus subcutaneous cellulitis secondary to the injections.  Asked the nurse to continue injections on the contralateral side and will continue to monitor for now.  Increasing leukocytosis is also noted.  Still on Zosyn.

## 2018-12-31 NOTE — Progress Notes (Signed)
Pt was complain of pain 10/10 in his abdomen. Pt was due for 0.5 mg of dilaudid prn but [facility] was out of stock. RN call pharmacy for a dose. Per Pharmacy they are really back up due to the down time. They suggested to go to the nearest pyxis to get one dose. Elita Quick RN went to [facility]ys trying to get a dose of 0.5 mg dilaudid but they were out stock too. RN Elita Quick and RN steven overrided 1 mg of dilaudid from [employer] pixys and wasted 0.5 mg. RN Elita Quick gave 0.5 mg of dilaudid to pt. Pt is now resting. RN will continue to assess and monitor pt.

## 2019-01-01 LAB — BASIC METABOLIC PANEL
Anion gap: 7 (ref 5–15)
BUN: 13 mg/dL (ref 6–20)
CO2: 27 mmol/L (ref 22–32)
Calcium: 8.3 mg/dL — ABNORMAL LOW (ref 8.9–10.3)
Chloride: 102 mmol/L (ref 98–111)
Creatinine, Ser: 0.71 mg/dL (ref 0.61–1.24)
GFR calc Af Amer: >60 mL/min (ref >60)
GFR calc non Af Amer: >60 mL/min (ref >60)
Glucose, Bld: 112 mg/dL — ABNORMAL HIGH (ref 70–99)
Potassium: 4.4 mmol/L (ref 3.5–5.1)
Sodium: 136 mmol/L (ref 135–145)

## 2019-01-01 LAB — CBC WITH DIFFERENTIAL/PLATELET
Abs Immature Granulocytes: 2.78 10*3/uL — ABNORMAL HIGH (ref 0.00–0.07)
Basophils Absolute: 0.2 10*3/uL — ABNORMAL HIGH (ref 0.0–0.1)
Basophils Relative: 1 %
Eosinophils Absolute: 1.3 10*3/uL — ABNORMAL HIGH (ref 0.0–0.5)
Eosinophils Relative: 5 %
HCT: 36.5 % — ABNORMAL LOW (ref 39.0–52.0)
Hemoglobin: 12.4 g/dL — ABNORMAL LOW (ref 13.0–17.0)
Immature Granulocytes: 11 %
Lymphocytes Relative: 7 %
Lymphs Abs: 2 10*3/uL (ref 0.7–4.0)
MCH: 30.5 pg (ref 26.0–34.0)
MCHC: 34 g/dL (ref 30.0–36.0)
MCV: 89.7 fL (ref 80.0–100.0)
Monocytes Absolute: 2.3 10*3/uL — ABNORMAL HIGH (ref 0.1–1.0)
Monocytes Relative: 9 %
Neutro Abs: 17.9 10*3/uL — ABNORMAL HIGH (ref 1.7–7.7)
Neutrophils Relative %: 67 %
Platelets: 547 10*3/uL — ABNORMAL HIGH (ref 150–400)
RBC: 4.07 MIL/uL — ABNORMAL LOW (ref 4.22–5.81)
RDW: 14.7 % (ref 11.5–15.5)
Smear Review: NORMAL
WBC: 26.5 10*3/uL — ABNORMAL HIGH (ref 4.0–10.5)
nRBC: 1.7 % — ABNORMAL HIGH (ref 0.0–0.2)

## 2019-01-01 LAB — PATHOLOGIST SMEAR REVIEW

## 2019-01-01 LAB — MAGNESIUM: Magnesium: 2 mg/dL (ref 1.7–2.4)

## 2019-01-01 LAB — PHOSPHORUS: Phosphorus: 3.5 mg/dL (ref 2.5–4.6)

## 2019-01-01 NOTE — Progress Notes (Signed)
Initial Nutrition Assessment  DOCUMENTATION CODES:   Obesity unspecified  INTERVENTION:   RD will monitor for diet advancement vs the need for nutrition support  Pt is at refeed risk   NUTRITION DIAGNOSIS:   Inadequate oral intake related to acute illness(Post op ileus) as evidenced by NPO status.  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 Per chart review, pt NPO x 7 days. NGT in place with 865ml output. Pt passing flatus but no BM yet. Abdomen soft and distended. Recommend TPN if unable to advanced diet by tomorrow. Pt is at refeed risk.   Per chart, pt with weight gain pta.   Medications reviewed and include: lovenox, synthroid, protonix, NaCl w/ 5% dextrose and KCl @75ml /hr, zosyn  Labs reviewed: K 4.4 wnl, P 3.5 wnl, Mg 2.0 wnl Wbc - 26.5(H)  NUTRITION - FOCUSED PHYSICAL EXAM:    Most Recent Value  Orbital Region  No depletion  Upper Arm Region  No depletion  Thoracic and Lumbar Region  No depletion  Buccal Region  No depletion  Temple Region  No depletion  Clavicle Bone Region  No depletion  Clavicle and Acromion Bone Region  No depletion  Scapular Bone Region  No depletion  Dorsal Hand  No depletion  Patellar Region  No depletion  Anterior Thigh Region  No depletion  Posterior Calf Region  No depletion  Edema (RD Assessment)  Moderate [BLE]  Hair  Reviewed  Eyes  Reviewed  Mouth  Reviewed  Skin  Reviewed  Nails  Reviewed     Diet Order:   Diet Order            Diet NPO time specified Except for: Sips with Meds, Ice Chips  Diet effective now             EDUCATION NEEDS:   No education  needs have been identified at this time  Skin:  Skin Assessment: Reviewed RN Assessment(incision abdomen with VAC)  Last BM:  10/30  Height:   Ht Readings from Last 1 Encounters:  12/27/18 5\' 8"  (1.727 m)    Weight:   Wt Readings from Last 1 Encounters:  12/27/18 104 kg    Ideal Body Weight:  70 kg  BMI:  Body mass index is 34.86 kg/m.  Estimated Nutritional Needs:   Kcal:  2100-2400kcal/day  Protein:  105-120g/day  Fluid:  >2.1L/day  Koleen Distance MS, RD, LDN Pager #- [phone] Office#- [phone] After Hours Pager: [phone]

## 2019-01-01 NOTE — Consult Note (Signed)
[facility] Nurse ostomy follow up Patient receiving care in [facility]. Stoma type/location: LLQ colostomy Stomal assessment/size: 1.5 inches, round, moist, red Peristomal assessment: MARSI to patient's left side.  Crusted x 2 with stoma powder and no sting prep pad Treatment options for stomal/peristomal skin: barrier ring Output: drops liquid brown in existing pouch Ostomy pouching: 2pc. flat Education provided: Patient performed removal, cleansing, all steps of new pouch application with almost no additional support or teaching. Enrolled patient in [facility] Discharge program: Yes, previously. Val Riles, RN, MSN, CWOCN, CNS-BC, pager [phone]

## 2019-01-01 NOTE — Progress Notes (Signed)
[facility] Hospital Day(s): 7.   Post op day(s): 5 Days Post-Op.   Interval History:  Patient seen and examined no acute events or new complaints overnight.  Leukocytosis - 26K (improved from yesterday), no fever Electrolytes normal NGT with 800 ccs recorded this morning JP and Wound vac with serosanguinous output Minimal stool from colostomy ~25 ccs, minimal gas Patient reports he has left > right abdominal pain intermittently, no nausea/emesis Mobilized only in the room   Vital signs in last 24 hours: [min-max] current  Temp:  [98.3 F (36.8 C)-98.9 F (37.2 C)] 98.3 F (36.8 C) (11/02 0418) Pulse Rate:  [98-101] 98 (11/02 0418) Resp:  [20] 20 (11/02 0418) BP: (139-144)/(88-96) 144/95 (11/02 0418) SpO2:  [95 %-96 %] 96 % (11/02 0418)     Height: 5\' 8"  (172.7 cm) Weight: 104 kg BMI (Calculated): 34.87   Intake/Output last 2 shifts:  11/01 0701 - 11/02 0700 In: 1858.7 [P.O.:30; I.V.:1643.8; IV Piggyback:184.9] Out: 1360 [id number]; Emesis/NG output:125; Drains:35; Stool:25]   Physical Exam:  Constitutional: alert, cooperative and no distress HEENT: NGT in place; output more bilious  Respiratory: breathing non-labored at rest  Cardiovascular: regular rate and sinus rhythm  Gastrointestinal: soft,diffuse soreness, and non-distended, JP in RLQ with serosanguinous output. Colostomy in LLQ, pink, patent, gas and bowel sweat in bag Integumentary:Wound vac to midline wound, good seal, serosanguinous   Labs:  CBC Latest Ref Rng & Units 01/01/2019 12/31/2018 12/30/2018  WBC 4.0 - 10.5 K/uL 26.5(H) 28.9(H) 24.8(H)  Hemoglobin 13.0 - 17.0 g/dL 12.4(L) 12.4(L) 12.6(L)  Hematocrit 39.0 - 52.0 % 36.5(L) 37.5(L) 36.5(L)  Platelets 150 - 400 K/uL 547(H) 539(H) 499(H)   CMP Latest Ref Rng & Units 01/01/2019 12/31/2018 12/30/2018  Glucose 70 - 99 mg/dL 112(H) 109(H) 134(H)  BUN 6 - 20 mg/dL 13 15 20   Creatinine 0.61 - 1.24 mg/dL 0.71 0.83  0.82  Sodium 135 - 145 mmol/L 136 139 141  Potassium 3.5 - 5.1 mmol/L 4.4 4.0 3.6  Chloride 98 - 111 mmol/L 102 99 101  CO2 22 - 32 mmol/L 27 29 31   Calcium 8.9 - 10.3 mg/dL 8.3(L) 8.5(L) 8.4(L)  Total Protein 6.5 - 8.1 g/dL - - -  Total Bilirubin 0.3 - 1.2 mg/dL - - -  Alkaline Phos 38 - 126 U/L - - -  AST 15 - 41 U/L - - -  ALT 0 - 44 U/L - - -     Imaging studies: No new pertinent imaging studies   Assessment/Plan:  43 y.o. male with slowly improving ileus otherwise doing well 5 Days Post-Op s/p Hartman's for complicated diverticulitis with abscess that failed to clinically improve despite IV Abx and percutaneous drainage   - Continue NPO + ice chips, gum, hard candy - discussed limitation  - Will consider initiation of TPN tomorrow if unable to remove NGT             - Continue NGT decompression for now; monitor output; likely repeat clamping trial tomorrow  - Monitor leukocytosis; improved; some component of Hx of splenectomy; no fever - pain control prn; antiemetics prn - monitor abdominal examination; colostomy output - mobilize; encouraged to ambulate in halls - medical management of comorbidities  - DVT prophylaxis  All of the above findings and recommendations were discussed with the patient, and the medical team, and all of patient's questions were answered to his expressed satisfaction.  -- Edison Simon, PA-C [facility] 01/01/2019, 7:53 AM [phone] M-F:  7am - 4pm

## 2019-01-02 ENCOUNTER — Inpatient Hospital Stay: Payer: Self-pay

## 2019-01-02 ENCOUNTER — Encounter: Payer: Self-pay | Admitting: Surgery

## 2019-01-02 LAB — BASIC METABOLIC PANEL
Anion gap: 10 (ref 5–15)
BUN: 13 mg/dL (ref 6–20)
CO2: 24 mmol/L (ref 22–32)
Calcium: 8.3 mg/dL — ABNORMAL LOW (ref 8.9–10.3)
Chloride: 101 mmol/L (ref 98–111)
Creatinine, Ser: 0.6 mg/dL — ABNORMAL LOW (ref 0.61–1.24)
GFR calc Af Amer: >60 mL/min (ref >60)
GFR calc non Af Amer: >60 mL/min (ref >60)
Glucose, Bld: 102 mg/dL — ABNORMAL HIGH (ref 70–99)
Potassium: 4 mmol/L (ref 3.5–5.1)
Sodium: 135 mmol/L (ref 135–145)

## 2019-01-02 LAB — CBC WITH DIFFERENTIAL/PLATELET
Abs Immature Granulocytes: 2.3 10*3/uL — ABNORMAL HIGH (ref 0.00–0.07)
Basophils Absolute: 0.2 10*3/uL — ABNORMAL HIGH (ref 0.0–0.1)
Basophils Relative: 1 %
Eosinophils Absolute: 1.1 10*3/uL — ABNORMAL HIGH (ref 0.0–0.5)
Eosinophils Relative: 4 %
HCT: 34.5 % — ABNORMAL LOW (ref 39.0–52.0)
Hemoglobin: 11.9 g/dL — ABNORMAL LOW (ref 13.0–17.0)
Immature Granulocytes: 9 %
Lymphocytes Relative: 9 %
Lymphs Abs: 2.2 10*3/uL (ref 0.7–4.0)
MCH: 30.5 pg (ref 26.0–34.0)
MCHC: 34.5 g/dL (ref 30.0–36.0)
MCV: 88.5 fL (ref 80.0–100.0)
Monocytes Absolute: 1.9 10*3/uL — ABNORMAL HIGH (ref 0.1–1.0)
Monocytes Relative: 8 %
Neutro Abs: 17.2 10*3/uL — ABNORMAL HIGH (ref 1.7–7.7)
Neutrophils Relative %: 69 %
Platelets: 565 10*3/uL — ABNORMAL HIGH (ref 150–400)
RBC: 3.9 MIL/uL — ABNORMAL LOW (ref 4.22–5.81)
RDW: 14.9 % (ref 11.5–15.5)
Smear Review: NORMAL
WBC: 24.8 10*3/uL — ABNORMAL HIGH (ref 4.0–10.5)
nRBC: 0.8 % — ABNORMAL HIGH (ref 0.0–0.2)

## 2019-01-02 LAB — PHOSPHORUS: Phosphorus: 3.3 mg/dL (ref 2.5–4.6)

## 2019-01-02 LAB — MAGNESIUM: Magnesium: 1.9 mg/dL (ref 1.7–2.4)

## 2019-01-02 LAB — GLUCOSE, CAPILLARY: Glucose-Capillary: 99 mg/dL (ref 70–99)

## 2019-01-02 MED ORDER — TRACE MINERALS CU-MN-SE-ZN 300-55-60-3000 MCG/ML IV SOLN
INTRAVENOUS | Status: DC
  Filled 2019-01-02: qty 960

## 2019-01-02 MED ORDER — INSULIN ASPART 100 UNIT/ML ~~LOC~~ SOLN
0.0000 [IU] | Freq: Four times a day (QID) | SUBCUTANEOUS | Status: DC
  Administered 2019-01-04 (×2): 1 [IU] via SUBCUTANEOUS
  Administered 2019-01-05: 2 [IU] via SUBCUTANEOUS
  Administered 2019-01-05 – 2019-01-09 (×11): 1 [IU] via SUBCUTANEOUS
  Filled 2019-01-02 (×15): qty 1

## 2019-01-02 MED ORDER — SODIUM CHLORIDE 0.9% FLUSH
10.0000 mL | Freq: Two times a day (BID) | INTRAVENOUS | Status: DC
  Administered 2019-01-03 – 2019-01-05 (×4): 10 mL
  Administered 2019-01-08: 20 mL
  Administered 2019-01-09 – 2019-01-10 (×3): 10 mL

## 2019-01-02 MED ORDER — SODIUM CHLORIDE 0.9 % IV SOLN
INTRAVENOUS | Status: AC
  Administered 2019-01-02: 18:00:00 via INTRAVENOUS

## 2019-01-02 MED ORDER — FAT EMULSION PLANT BASED 20 % IV EMUL
500.0000 mL | INTRAVENOUS | Status: AC
  Administered 2019-01-02: 500 mL via INTRAVENOUS
  Filled 2019-01-02: qty 500

## 2019-01-02 MED ORDER — TRACE MINERALS CU-MN-SE-ZN 300-55-60-3000 MCG/ML IV SOLN
INTRAVENOUS | Status: AC
  Administered 2019-01-02: 18:00:00 via INTRAVENOUS
  Filled 2019-01-02: qty 960

## 2019-01-02 MED ORDER — SODIUM CHLORIDE 0.9% FLUSH
10.0000 mL | INTRAVENOUS | Status: DC | PRN
  Administered 2019-01-10: 10 mL
  Filled 2019-01-02: qty 40

## 2019-01-02 NOTE — Progress Notes (Signed)
Peripherally Inserted Central Catheter/Midline Placement  The IV Nurse has discussed with the patient and/or persons authorized to consent for the patient, the purpose of this procedure and the potential benefits and risks involved with this procedure.  The benefits include less needle sticks, lab draws from the catheter, and the patient may be discharged home with the catheter. Risks include, but not limited to, infection, bleeding, blood clot (thrombus formation), and puncture of an artery; nerve damage and irregular heartbeat and possibility to perform a PICC exchange if needed/ordered by physician.  Alternatives to this procedure were also discussed.  Bard Power PICC patient education guide, fact sheet on infection prevention and patient information card has been provided to patient /or left at bedside.    PICC/Midline Placement Documentation  PICC Double Lumen [id number] PICC Right Basilic 41 cm 0 cm (Active)  Indication for Insertion or Continuance of Line Administration of hyperosmolar/irritating solutions (i.e. TPN, Vancomycin, etc.) 01/02/19 1600  Exposed Catheter (cm) 0 cm 01/02/19 1600  Site Assessment Clean;Dry;Intact 01/02/19 1600  Lumen #1 Status Flushed;Saline locked;Blood return noted 01/02/19 1600  Lumen #2 Status Flushed;Saline locked;Blood return noted 01/02/19 1600  Dressing Type Transparent;Securing device 01/02/19 1600  Dressing Status Clean;Dry;Intact;Antimicrobial disc in place 01/02/19 1600  Line Care Connections checked and tightened 01/02/19 1600  Dressing Intervention New dressing;Other (Comment) 01/02/19 1600  Dressing Change Due 01/09/19 01/02/19 1600   Patient gave written consent.    Virgilio Belling 01/02/2019, 4:38 PM

## 2019-01-02 NOTE — Anesthesia Postprocedure Evaluation (Signed)
Anesthesia Post Note  Patient: Brandon Mullen  Procedure(s) Performed: EXPLORATORY LAPAROTOMY (N/A ) COLECTOMY WITH COLOSTOMY CREATION/HARTMANN PROCEDURE (N/A )  Patient location during evaluation: PACU Anesthesia Type: General Level of consciousness: awake and alert and oriented Pain management: pain level controlled Vital Signs Assessment: post-procedure vital signs reviewed and stable Respiratory status: spontaneous breathing Cardiovascular status: blood pressure returned to baseline Anesthetic complications: no     Last Vitals:  Vitals:   01/01/19 2038 01/02/19 0531  BP: (!) 136/95 (!) 145/90  Pulse: 95 74  Resp:  18  Temp: 36.8 C 36.8 C  SpO2: 96% 95%    Last Pain:  Vitals:   01/02/19 0746  TempSrc:   PainSc: 9                  [name]

## 2019-01-02 NOTE — Progress Notes (Signed)
Brandon Mullen Day(s): 8.   Post op day(s): 6 Days Post-Op.   Interval History: Patient seen and examined no acute events or new complaints overnight. Patient reports he continues to have abdominal soreness. No fever, chills, nausea, or emesis.  Leukocytosis improving (24k) Colostomy - 25 ccs NGT - 1.2L in last 24 hours  Vital signs in last 24 hours: [min-max] current  Temp:  [98.1 F (36.7 C)-98.3 F (36.8 C)] 98.2 F (36.8 C) (11/03 0531) Pulse Rate:  [74-95] 74 (11/03 0531) Resp:  [16-18] 18 (11/03 0531) BP: (131-145)/(89-95) 145/90 (11/03 0531) SpO2:  [95 %-97 %] 95 % (11/03 0531)     Height: 5\' 8"  (172.7 cm) Weight: 104 kg BMI (Calculated): 34.87   Intake/Output last 2 shifts:  11/02 0701 - 11/03 0700 In: 2056.5 [I.V.:1891.3; IV Piggyback:165.2] Out: 3075 [Urine:1775; Emesis/NG output:1275; Stool:25]   Physical Exam:  Constitutional: alert, cooperative and no distress HEENT:NGT in place; output more bilious  Respiratory: breathing non-labored at rest  Cardiovascular: regular rate and sinus rhythm  Gastrointestinal: soft,diffuse soreness, and non-distended, JP in RLQ with serosanguinous output. Colostomy in LLQ, pink, patent, gas and bowel sweat in bag Integumentary:Wound vac to midline wound, good seal, serosanguinous   Labs:  CBC Latest Ref Rng & Units 01/02/2019 01/01/2019 12/31/2018  WBC 4.0 - 10.5 K/uL 24.8(H) 26.5(H) 28.9(H)  Hemoglobin 13.0 - 17.0 g/dL 11.9(L) 12.4(L) 12.4(L)  Hematocrit 39.0 - 52.0 % 34.5(L) 36.5(L) 37.5(L)  Platelets 150 - 400 K/uL 565(H) 547(H) 539(H)   CMP Latest Ref Rng & Units 01/02/2019 01/01/2019 12/31/2018  Glucose 70 - 99 mg/dL 102(H) 112(H) 109(H)  BUN 6 - 20 mg/dL 13 13 15   Creatinine 0.61 - 1.24 mg/dL 0.60(L) 0.71 0.83  Sodium 135 - 145 mmol/L 135 136 139  Potassium 3.5 - 5.1 mmol/L 4.0 4.4 4.0  Chloride 98 - 111 mmol/L 101 102 99  CO2 22 - 32 mmol/L 24 27 29   Calcium 8.9 - 10.3  mg/dL 8.3(L) 8.3(L) 8.5(L)  Total Protein 6.5 - 8.1 g/dL - - -  Total Bilirubin 0.3 - 1.2 mg/dL - - -  Alkaline Phos 38 - 126 U/L - - -  AST 15 - 41 U/L - - -  ALT 0 - 44 U/L - - -     Imaging studies: No new pertinent imaging studies   Assessment/Plan:  43 y.o. male with prolonged post-surgical ileus still awaiting return of bowel function 6 Days Post-Op s/p Hartman's for complicated diverticulitis with abscess that failed to clinically improve despite IV Abx and percutaneous drainage              - Continue NPO  - Will consult for PICC and TPN given prolonged ileus and lack of significant PO intake x7 days now; monitor nutritional labs - Continue NGT decompression for now; monitor output; likely repeat clamping trial tomorrow             - Monitor leukocytosis; improved; some component of Hx of splenectomy; no fever  - Continue wound vac changes MWF - pain control prn; antiemetics prn - monitor abdominal examination; colostomy output - mobilize; encouraged to ambulate in halls - medical management of comorbidities  - DVT prophylaxis  All of the above findings and recommendations were discussed with the patient, and the medical team, and all of patient's questions were answered to his expressed satisfaction.  -- Edison Simon, PA-C [facility] 01/02/2019, 7:18 AM [phone] M-F: 7am - 4pm

## 2019-01-02 NOTE — Consult Note (Signed)
PHARMACY - ADULT TOTAL PARENTERAL NUTRITION CONSULT NOTE   Pharmacy Consult for TPN management Indication: Inadequate oral intake related to acute illness(Post op ileus) as evidenced by NPO status  Patient Measurements: Height: 5\' 8"  (172.7 cm) Weight: 229 lb 4.5 oz (104 kg) IBW/kg (Calculated) : 68.4 TPN AdjBW (KG): 77.3 Body mass index is 34.86 kg/m.  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 Pt continues to be NPO. NGT in place with 1.3L output.   GI: LBM 11/01 Endo:  Insulin requirements in the past 24 hours: none Lytes: all  WNL today Renal: SCr 0.96--->0.60 Pulm: Cards:  Hepatobil: Neuro: ID: WBC 36.1--->24.8 Zosyn 10/26 >>  TPN Access: PICC line order placed 11/3 TPN start date: pending PICC line placement Nutritional Goals (per RD recommendation on 11/3): KCal: 2352 kcal/day Protein: 100 g/day Fluid: 2292 mL  Goal TPN rate is 83 ml/hr   Current Nutrition: NPO  Plan:   Start E 5/20 TPN at 40 mL/hr  20% lipid emulsion at 25 mL/hr x 12 hours/day  This TPN at goal rate provides 100 g of protein, 192 g of dextrose, and 60 g of lipids which provides 2352 kCals per day, meeting 90% of patient needs  Electrolytes in TPN: no additional  Change IVF to NaCl and keep total TPN + IVF rate at 13ml/hr   Add MVI, trace elements  and thiamine 100 mg/day x 3 days to TPN  Q6h sensitive SSI and adjust as needed  moderate refeeding risk; check P, K, and Mg daily for 3 days after TPN iniatition  F/U in am  Dallie Piles, PharmD 01/02/2019,10:20 AM

## 2019-01-02 NOTE — Progress Notes (Signed)
Nutrition Follow Up Note   DOCUMENTATION CODES:   Obesity unspecified  INTERVENTION:    Initiate Clinimix 5/20 with electrolytes at 68ml/hr (Goal rate 88ml/hr once labs stable)   Initiate 20% lipids at 15ml/hr x 12 hrs/day  Regimen @ goal rate will provide 2352kcal/day, 100g/day protein, 2248ml volume    Add MVI daily and trace elements daily    Add IV thiamine 100mg  daily x 3 days   Pt at moderate refeeding risk; recommend check P, K, and Mg daily for 3 days after TPN iniatition.    Daily weights   Change IVF to NaCl and keep total TPN + IVF rate @ 171ml/hr per MD  NUTRITION DIAGNOSIS:   Inadequate oral intake related to acute illness(Post op ileus) as evidenced by NPO status.  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 NGT in place with 1.3L output. Plan is for PICC line and TPN today.  Medications reviewed and include: lovenox, synthroid, protonix, NaCl w/ 5% dextrose and KCl @75ml /hr, zosyn  Labs reviewed: K 4.0 wnl, P 3.3 wnl, Mg 1.9 wnl Wbc - 24.8(H)  Diet Order:   Diet Order            Diet NPO time specified Except for: Sips with Meds, Ice Chips  Diet effective now             EDUCATION NEEDS:   No education needs have been identified at this time  Skin:  Skin Assessment: Reviewed RN Assessment(incision abdomen with VAC)  Last BM:  40ml via colostomy   Height:   Ht Readings from Last 1 Encounters:  12/27/18 5\' 8"  (1.727 m)    Weight:   Wt Readings from Last 1 Encounters:  12/27/18 104 kg    Ideal Body Weight:  70 kg  BMI:  Body mass index is  34.86 kg/m.  Estimated Nutritional Needs:   Kcal:  2100-2400kcal/day  Protein:  105-120g/day  Fluid:  >2.1L/day  Koleen Distance MS, RD, LDN Pager #- [phone] Office#- [phone] After Hours Pager: [phone]

## 2019-01-03 LAB — GLUCOSE, CAPILLARY
Glucose-Capillary: 109 mg/dL — ABNORMAL HIGH (ref 70–99)
Glucose-Capillary: 117 mg/dL — ABNORMAL HIGH (ref 70–99)
Glucose-Capillary: 85 mg/dL (ref 70–99)
Glucose-Capillary: 134 mg/dL — ABNORMAL HIGH (ref 70–99)

## 2019-01-03 LAB — CBC WITH DIFFERENTIAL/PLATELET
Abs Immature Granulocytes: 1.75 10*3/uL — ABNORMAL HIGH (ref 0.00–0.07)
Basophils Absolute: 0.2 10*3/uL — ABNORMAL HIGH (ref 0.0–0.1)
Basophils Relative: 1 %
Eosinophils Absolute: 0.9 10*3/uL — ABNORMAL HIGH (ref 0.0–0.5)
Eosinophils Relative: 4 %
HCT: 38.3 % — ABNORMAL LOW (ref 39.0–52.0)
Hemoglobin: 12.8 g/dL — ABNORMAL LOW (ref 13.0–17.0)
Immature Granulocytes: 8 %
Lymphocytes Relative: 11 %
Lymphs Abs: 2.5 10*3/uL (ref 0.7–4.0)
MCH: 30 pg (ref 26.0–34.0)
MCHC: 33.4 g/dL (ref 30.0–36.0)
MCV: 89.7 fL (ref 80.0–100.0)
Monocytes Absolute: 1.5 10*3/uL — ABNORMAL HIGH (ref 0.1–1.0)
Monocytes Relative: 7 %
Neutro Abs: 15.5 10*3/uL — ABNORMAL HIGH (ref 1.7–7.7)
Neutrophils Relative %: 69 %
Platelets: 667 10*3/uL — ABNORMAL HIGH (ref 150–400)
RBC: 4.27 MIL/uL (ref 4.22–5.81)
RDW: 14.8 % (ref 11.5–15.5)
WBC: 22.3 10*3/uL — ABNORMAL HIGH (ref 4.0–10.5)
nRBC: 0.3 % — ABNORMAL HIGH (ref 0.0–0.2)

## 2019-01-03 LAB — MAGNESIUM: Magnesium: 2.1 mg/dL (ref 1.7–2.4)

## 2019-01-03 LAB — PREALBUMIN: Prealbumin: 12.2 mg/dL — ABNORMAL LOW (ref 18–38)

## 2019-01-03 LAB — BASIC METABOLIC PANEL
Anion gap: 11 (ref 5–15)
BUN: 11 mg/dL (ref 6–20)
CO2: 25 mmol/L (ref 22–32)
Calcium: 8.3 mg/dL — ABNORMAL LOW (ref 8.9–10.3)
Chloride: 100 mmol/L (ref 98–111)
Creatinine, Ser: 0.77 mg/dL (ref 0.61–1.24)
GFR calc Af Amer: >60 mL/min (ref >60)
GFR calc non Af Amer: >60 mL/min (ref >60)
Glucose, Bld: 117 mg/dL — ABNORMAL HIGH (ref 70–99)
Potassium: 4 mmol/L (ref 3.5–5.1)
Sodium: 136 mmol/L (ref 135–145)

## 2019-01-03 LAB — PHOSPHORUS: Phosphorus: 3.4 mg/dL (ref 2.5–4.6)

## 2019-01-03 LAB — TRIGLYCERIDES: Triglycerides: 227 mg/dL — ABNORMAL HIGH (ref <150)

## 2019-01-03 MED ORDER — FUROSEMIDE 10 MG/ML IJ SOLN
20.0000 mg | Freq: Once | INTRAMUSCULAR | Status: AC
  Administered 2019-01-03: 20 mg via INTRAVENOUS
  Filled 2019-01-03: qty 4

## 2019-01-03 MED ORDER — SODIUM CHLORIDE 0.9 % IV SOLN
INTRAVENOUS | Status: DC | PRN
  Administered 2019-01-03 (×3): 250 mL via INTRAVENOUS
  Administered 2019-01-06: 20 mL via INTRAVENOUS
  Administered 2019-01-06: 10 mL via INTRAVENOUS
  Administered 2019-01-09 – 2019-01-10 (×2): 250 mL via INTRAVENOUS

## 2019-01-03 MED ORDER — FAT EMULSION PLANT BASED 20 % IV EMUL
500.0000 mL | INTRAVENOUS | Status: AC
  Administered 2019-01-03: 500 mL via INTRAVENOUS
  Filled 2019-01-03: qty 500

## 2019-01-03 MED ORDER — TRACE MINERALS CU-MN-SE-ZN 300-55-60-3000 MCG/ML IV SOLN
INTRAVENOUS | Status: AC
  Administered 2019-01-03: 18:00:00 via INTRAVENOUS
  Filled 2019-01-03: qty 1992

## 2019-01-03 NOTE — Progress Notes (Signed)
Nutrition Brief Follow Up Note   INTERVENTION:    Increase Clinimix 5/20 with electrolytes to goal rate 55ml/hr    Continue 20% lipids at 61ml/hr x 12 hrs/day  Regimen provides 2352kcal/day, 100g/day protein, 2214ml volume    Continue MVI daily and trace elements daily    Discontinue thiamine    Daily weights   Discontinue IVF per MD  Estimated Nutritional Needs:   Kcal:  2100-2400kcal/day  Protein:  105-120g/day  Fluid:  >2.1L/day  Koleen Distance MS, RD, LDN Pager #- [phone] Office#- [phone] After Hours Pager: [phone]

## 2019-01-03 NOTE — Progress Notes (Signed)
[facility] Day(s): 9.   Post op day(s): 7 Days Post-Op.   Interval History:  Patient seen and examined,  no acute events or new complaints overnight.  PICC placed and TPN initiated yesterday Patient reports continued abdominal soreness but this has slowly improved, no fever, chills, nausea, or emesis Still awaiting significant bowel function, minimal gas in bag, no stool Leukocytosis gradually improving - 22K now (from 24K yesterday) No electrolyte derangement NGT - 750 ccs out Wound Vac - serosanguinous  Vital signs in last 24 hours: [min-max] current  Temp:  [97.5 F (36.4 C)-98.5 F (36.9 C)] 97.5 F (36.4 C) (11/03 2244) Pulse Rate:  [80-82] 82 (11/03 2244) Resp:  [18] 18 (11/03 1221) BP: (134-138)/(84-93) 134/93 (11/03 2244) SpO2:  [92 %-100 %] 92 % (11/03 2244) Weight:  [98.3 kg] 98.3 kg (11/04 0500)     Height: 5\' 8"  (172.7 cm) Weight: 98.3 kg BMI (Calculated): 32.96   Intake/Output last 2 shifts:  11/03 0701 - 11/04 0700 In: 1971.5 [I.V.:1774.8; IV Piggyback:196.7] Out: 2075 [Urine:1250; Emesis/NG output:750; Drains:75]   Physical Exam:  Constitutional: alert, cooperative and no distress HEENT:NGT inplace; output more bilious Respiratory: breathing non-labored at rest  Cardiovascular: regular rate and sinus rhythm  Gastrointestinal: soft,diffuse soreness, and non-distended, JP in RLQ with serosanguinous output. Colostomy in LLQ, pink, patent, minimal gas and bowel sweat in bag Integumentary:Wound vac to midline wound, good seal, serosanguinous Musculoskeletal: + 2 Pitting edema bilaterally   Labs:  CBC Latest Ref Rng & Units 01/03/2019 01/02/2019 01/01/2019  WBC 4.0 - 10.5 K/uL 22.3(H) 24.8(H) 26.5(H)  Hemoglobin 13.0 - 17.0 g/dL 12.8(L) 11.9(L) 12.4(L)  Hematocrit 39.0 - 52.0 % 38.3(L) 34.5(L) 36.5(L)  Platelets 150 - 400 K/uL 667(H) 565(H) 547(H)   CMP Latest Ref Rng & Units 01/03/2019 01/02/2019  01/01/2019  Glucose 70 - 99 mg/dL 117(H) 102(H) 112(H)  BUN 6 - 20 mg/dL 11 13 13   Creatinine 0.61 - 1.24 mg/dL 0.77 0.60(L) 0.71  Sodium 135 - 145 mmol/L 136 135 136  Potassium 3.5 - 5.1 mmol/L 4.0 4.0 4.4  Chloride 98 - 111 mmol/L 100 101 102  CO2 22 - 32 mmol/L 25 24 27   Calcium 8.9 - 10.3 mg/dL 8.3(L) 8.3(L) 8.3(L)  Total Protein 6.5 - 8.1 g/dL - - -  Total Bilirubin 0.3 - 1.2 mg/dL - - -  Alkaline Phos 38 - 126 U/L - - -  AST 15 - 41 U/L - - -  ALT 0 - 44 U/L - - -     Imaging studies: No new pertinent imaging studies   Assessment/Plan:  43 y.o. male with prolonged post-surgical ileus still awaiting return of bowel function 7 Days Post-Op s/p Hartman's for complicated diverticulitis with abscess that failed to clinically improve despite IV Abx and percutaneous drainage   - Continue NPO + TPN; monitor nutritional labs - Continue NGT decompression for now; monitor output; likely repeat clamping trial tomorrow  - Will give one time dose of 20 mg Lasix IV for edema - Monitor leukocytosis; improved; some component of Hx of splenectomy; no fever             - Continue wound vac changes MWF; will change today - pain control prn; antiemetics prn - monitor abdominal examination; colostomy output - mobilize; encouraged to ambulate in halls - medical management of comorbidities  - DVT prophylaxis  All of the above findings and recommendations were discussed with the patient, and the medical team, and  all of patient's questions were answered to his expressed satisfaction.  -- Edison Simon, PA-C [facility] 01/03/2019, 7:41 AM [phone] M-F: 7am - 4pm

## 2019-01-03 NOTE — Consult Note (Signed)
PHARMACY - ADULT TOTAL PARENTERAL NUTRITION CONSULT NOTE   Pharmacy Consult for TPN management Indication: Inadequate oral intake related to acute illness(Post op ileus) as evidenced by NPO status  Patient Measurements: Height: 5\' 8"  (172.7 cm) Weight: 216 lb 11.4 oz (98.3 kg) IBW/kg (Calculated) : 68.4 TPN AdjBW (KG): 77.3 Body mass index is 32.95 kg/m.  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 Pt continues to be NPO. NGT in place with 1.3L output.   GI: LBM 11/01 Endo:  Insulin requirements in the past 24 hours: none Lytes: all  WNL today Renal: SCr 0.96--->0.77 Pulm: Cards:  Hepatobil: Neuro: ID: WBC 36.1--->22.3 Zosyn 10/26 >>  TPN Access: PICC line order placed 11/3 TPN start date: pending PICC line placement Nutritional Goals (per RD recommendation on 11/3): KCal: 2352 kcal/day Protein: 100 g/day Fluid: 2292 mL  Goal TPN rate is 83 ml/hr   Current Nutrition: NPO  Plan:   Will increase TPN to goal, Clinimax E 5/20 TPN at 83 mL/hr  20% lipid emulsion at 25 mL/hr x 12 hours/day  This TPN at goal rate provides 100 g of protein, 192 g of dextrose, and 60 g of lipids which provides 2352 kCals per day, meeting 90% of patient needs  Electrolytes in TPN: no additional  Will discontinue IVF and have the TPN running only.  Add MVI, trace elements  and thiamine 100 mg/day x 3 days to TPN  Q6h sensitive SSI and adjust as needed  moderate refeeding risk; check P, K, and Mg daily for 3 days after TPN iniatition  F/U in am  Pearla Dubonnet, PharmD 01/03/2019,1:58 PM

## 2019-01-04 ENCOUNTER — Encounter: Payer: Self-pay | Admitting: Radiology

## 2019-01-04 ENCOUNTER — Inpatient Hospital Stay: Payer: BC Managed Care – PPO

## 2019-01-04 LAB — GLUCOSE, CAPILLARY
Glucose-Capillary: 106 mg/dL — ABNORMAL HIGH (ref 70–99)
Glucose-Capillary: 120 mg/dL — ABNORMAL HIGH (ref 70–99)
Glucose-Capillary: 121 mg/dL — ABNORMAL HIGH (ref 70–99)
Glucose-Capillary: 124 mg/dL — ABNORMAL HIGH (ref 70–99)
Glucose-Capillary: 125 mg/dL — ABNORMAL HIGH (ref 70–99)
Glucose-Capillary: 126 mg/dL — ABNORMAL HIGH (ref 70–99)

## 2019-01-04 LAB — CBC WITH DIFFERENTIAL/PLATELET
Abs Immature Granulocytes: 1.11 10*3/uL — ABNORMAL HIGH (ref 0.00–0.07)
Basophils Absolute: 0.1 10*3/uL (ref 0.0–0.1)
Basophils Relative: 1 %
Eosinophils Absolute: 0.8 10*3/uL — ABNORMAL HIGH (ref 0.0–0.5)
Eosinophils Relative: 4 %
HCT: 32.7 % — ABNORMAL LOW (ref 39.0–52.0)
Hemoglobin: 11.6 g/dL — ABNORMAL LOW (ref 13.0–17.0)
Immature Granulocytes: 5 %
Lymphocytes Relative: 10 %
Lymphs Abs: 2.3 10*3/uL (ref 0.7–4.0)
MCH: 31.1 pg (ref 26.0–34.0)
MCHC: 35.5 g/dL (ref 30.0–36.0)
MCV: 87.7 fL (ref 80.0–100.0)
Monocytes Absolute: 1.9 10*3/uL — ABNORMAL HIGH (ref 0.1–1.0)
Monocytes Relative: 9 %
Neutro Abs: 15.7 10*3/uL — ABNORMAL HIGH (ref 1.7–7.7)
Neutrophils Relative %: 71 %
Platelets: 620 10*3/uL — ABNORMAL HIGH (ref 150–400)
RBC: 3.73 MIL/uL — ABNORMAL LOW (ref 4.22–5.81)
RDW: 14.6 % (ref 11.5–15.5)
WBC: 21.8 10*3/uL — ABNORMAL HIGH (ref 4.0–10.5)
nRBC: 0.1 % (ref 0.0–0.2)

## 2019-01-04 LAB — COMPREHENSIVE METABOLIC PANEL
ALT: 43 U/L (ref 0–44)
AST: 35 U/L (ref 15–41)
Albumin: 2.4 g/dL — ABNORMAL LOW (ref 3.5–5.0)
Alkaline Phosphatase: 76 U/L (ref 38–126)
Anion gap: 9 (ref 5–15)
BUN: 10 mg/dL (ref 6–20)
CO2: 26 mmol/L (ref 22–32)
Calcium: 8.2 mg/dL — ABNORMAL LOW (ref 8.9–10.3)
Chloride: 102 mmol/L (ref 98–111)
Creatinine, Ser: 0.68 mg/dL (ref 0.61–1.24)
GFR calc Af Amer: >60 mL/min (ref >60)
GFR calc non Af Amer: >60 mL/min (ref >60)
Glucose, Bld: 125 mg/dL — ABNORMAL HIGH (ref 70–99)
Potassium: 3.6 mmol/L (ref 3.5–5.1)
Sodium: 137 mmol/L (ref 135–145)
Total Bilirubin: 1.2 mg/dL (ref 0.3–1.2)
Total Protein: 5.7 g/dL — ABNORMAL LOW (ref 6.5–8.1)

## 2019-01-04 LAB — MAGNESIUM: Magnesium: 2 mg/dL (ref 1.7–2.4)

## 2019-01-04 LAB — PHOSPHORUS: Phosphorus: 3.3 mg/dL (ref 2.5–4.6)

## 2019-01-04 MED ORDER — IOHEXOL 9 MG/ML PO SOLN
500.0000 mL | ORAL | Status: AC
  Administered 2019-01-04 (×2): 500 mL via ORAL

## 2019-01-04 MED ORDER — TRACE MINERALS CU-MN-SE-ZN 300-55-60-3000 MCG/ML IV SOLN
INTRAVENOUS | Status: AC
  Administered 2019-01-04: 19:00:00 via INTRAVENOUS
  Filled 2019-01-04: qty 1992

## 2019-01-04 MED ORDER — FAT EMULSION PLANT BASED 20 % IV EMUL
500.0000 mL | INTRAVENOUS | Status: AC
  Administered 2019-01-04: 500 mL via INTRAVENOUS
  Filled 2019-01-04: qty 500

## 2019-01-04 MED ORDER — CHLORHEXIDINE GLUCONATE CLOTH 2 % EX PADS
6.0000 | MEDICATED_PAD | Freq: Every day | CUTANEOUS | Status: DC
  Administered 2019-01-04 – 2019-01-09 (×6): 6 via TOPICAL

## 2019-01-04 MED ORDER — IOHEXOL 300 MG/ML  SOLN
100.0000 mL | Freq: Once | INTRAMUSCULAR | Status: AC | PRN
  Administered 2019-01-04: 100 mL via INTRAVENOUS

## 2019-01-04 NOTE — Consult Note (Signed)
PHARMACY - ADULT TOTAL PARENTERAL NUTRITION CONSULT NOTE   Pharmacy Consult for TPN management Indication: Inadequate oral intake related to acute illness(Post op ileus) as evidenced by NPO status  Patient Measurements: Height: 5\' 8"  (172.7 cm) Weight: 212 lb 8.4 oz (96.4 kg) IBW/kg (Calculated) : 68.4 TPN AdjBW (KG): 77.3 Body mass index is 32.31 kg/m.  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 Pt continues to be NPO. NGT in place with 1.3L output.   GI: LBM 11/01 Endo:  Insulin requirements in the past 24 hours: 2 units Lytes: all  WNL today Renal: SCr 0.96--->0.68 Pulm: Cards:  Hepatobil: Neuro: ID: WBC 36.1--->21.8 Zosyn 10/26 >>  TPN Access: PICC line order placed 11/3 TPN start date: pending PICC line placement Nutritional Goals (per RD recommendation on 11/3): KCal: 2352 kcal/day Protein: 100 g/day Fluid: 2292 mL  Goal TPN rate is 83 ml/hr   Current Nutrition: NPO  Plan:   Will continue TPN at goal rate, Clinimax E 5/20 TPN at 83 mL/hr  20% lipid emulsion at 25 mL/hr x 12 hours/day  This TPN at goal rate provides 100 g of protein, 192 g of dextrose, and 60 g of lipids which provides 2352 kCals per day, meeting 90% of patient needs  Electrolytes in TPN: no additional  Will discontinue IVF and have the TPN running only.  Add MVI, trace elements  and thiamine 100 mg/day x 3 days to TPN  Q6h sensitive SSI and adjust as needed  moderate refeeding risk; check P, K, and Mg daily for 3 days after TPN iniatition  F/U in am  Pearla Dubonnet, PharmD 01/04/2019,2:29 PM

## 2019-01-04 NOTE — Progress Notes (Signed)
[place] [facility] Day(s): 10.   Post op day(s): 8 Days Post-Op.   Interval History:  Patient seen and examined,  no acute events or new complaints overnight.  Patient reports stable abdominal soreness, no nausea, emesis, fever, or chills Still with minimal gas from colostomy NGT with 1/3 L out Increased U/O with Lasix yesterday and improvement in swelling Leukocytosis slowly improved Mobilizing  Vital signs in last 24 hours: [min-max] current  Temp:  [98 F (36.7 C)] 98 F (36.7 C) (11/04 2055) Pulse Rate:  [83-91] 83 (11/04 2055) Resp:  [20] 20 (11/04 2055) BP: (137)/(85-96) 137/96 (11/04 2055) SpO2:  [97 %-98 %] 97 % (11/04 2055) Weight:  [96.4 kg-98.3 kg] 96.4 kg (11/05 0500)     Height: 5\' 8"  (172.7 cm) Weight: 96.4 kg BMI (Calculated): 32.32   Intake/Output last 2 shifts:  11/04 0701 - 11/05 0700 In: 4368 [I.V.:4238; IV Piggyback:130] Out: 2465 [Urine:1075; Emesis/NG output:1300; Drains:80; Stool:10]   Physical Exam:  Constitutional: alert, cooperative and no distress HEENT:NGT inplace; output more bilious Respiratory: breathing non-labored at rest  Cardiovascular: regular rate and sinus rhythm  Gastrointestinal: soft,diffuse soreness, and non-distended, JP in RLQ with serosanguinous output. Colostomy in LLQ, pink, patent, minimal gas and bowel sweat in bag Integumentary:Wound vac to midline wound, good seal, serosanguinous Musculoskeletal: + 2 Pitting edema bilaterally   Labs:  CBC Latest Ref Rng & Units 01/04/2019 01/03/2019 01/02/2019  WBC 4.0 - 10.5 K/uL 21.8(H) 22.3(H) 24.8(H)  Hemoglobin 13.0 - 17.0 g/dL 11.6(L) 12.8(L) 11.9(L)  Hematocrit 39.0 - 52.0 % 32.7(L) 38.3(L) 34.5(L)  Platelets 150 - 400 K/uL 620(H) 667(H) 565(H)   CMP Latest Ref Rng & Units 01/04/2019 01/03/2019 01/02/2019  Glucose 70 - 99 mg/dL 125(H) 117(H) 102(H)  BUN 6 - 20 mg/dL 10 11 13   Creatinine 0.61 - 1.24 mg/dL 0.68 0.77 0.60(L)  Sodium  135 - 145 mmol/L 137 136 135  Potassium 3.5 - 5.1 mmol/L 3.6 4.0 4.0  Chloride 98 - 111 mmol/L 102 100 101  CO2 22 - 32 mmol/L 26 25 24   Calcium 8.9 - 10.3 mg/dL 8.2(L) 8.3(L) 8.3(L)  Total Protein 6.5 - 8.1 g/dL 5.7(L) - -  Total Bilirubin 0.3 - 1.2 mg/dL 1.2 - -  Alkaline Phos 38 - 126 U/L 76 - -  AST 15 - 41 U/L 35 - -  ALT 0 - 44 U/L 43 - -     Imaging studies: No new pertinent imaging studies   Assessment/Plan:  43 y.o. male still with prolonged post-surgical ileus 8 Days Post-Op s/p Hartman's for complicated diverticulitis with abscess that failed to clinically improve despite IV Abx and percutaneous drainage.   - Will repeat CT Abdomen/Pelvis this morning to reassess abdomen and assess for any potential causes of prolonged ileus  - Continue NPO + TPN; monitor nutritional labs - Continue NGT decompression for now; monitor output; likely repeat clamping trial tomorrow - Monitor leukocytosis; improved; some component of Hx of splenectomy; no fever - Continue wound vac changes MWF; will change today - pain control prn; antiemetics prn - monitor abdominal examination; colostomy output - mobilize; encouraged to ambulate in halls - medical management of comorbidities  - DVT prophylaxis  All of the above findings and recommendations were discussed with the patient, and the medical team, and all of patient's questions were answered to his expressed satisfaction.  -- Edison Simon, PA-C [facility] 01/04/2019, 7:56 AM [phone] M-F: 7am - 4pm

## 2019-01-05 LAB — CBC
HCT: 34.5 % — ABNORMAL LOW (ref 39.0–52.0)
Hemoglobin: 11.6 g/dL — ABNORMAL LOW (ref 13.0–17.0)
MCH: 30.1 pg (ref 26.0–34.0)
MCHC: 33.6 g/dL (ref 30.0–36.0)
MCV: 89.4 fL (ref 80.0–100.0)
Platelets: 696 10*3/uL — ABNORMAL HIGH (ref 150–400)
RBC: 3.86 MIL/uL — ABNORMAL LOW (ref 4.22–5.81)
RDW: 15.5 % (ref 11.5–15.5)
WBC: 19.9 10*3/uL — ABNORMAL HIGH (ref 4.0–10.5)
nRBC: 0.2 % (ref 0.0–0.2)

## 2019-01-05 LAB — GLUCOSE, CAPILLARY
Glucose-Capillary: 153 mg/dL — ABNORMAL HIGH (ref 70–99)
Glucose-Capillary: 119 mg/dL — ABNORMAL HIGH (ref 70–99)
Glucose-Capillary: 111 mg/dL — ABNORMAL HIGH (ref 70–99)
Glucose-Capillary: 110 mg/dL — ABNORMAL HIGH (ref 70–99)

## 2019-01-05 LAB — PHOSPHORUS: Phosphorus: 3.7 mg/dL (ref 2.5–4.6)

## 2019-01-05 MED ORDER — HYDROMORPHONE HCL 1 MG/ML IJ SOLN
0.5000 mg | Freq: Once | INTRAMUSCULAR | Status: DC
  Filled 2019-01-05: qty 0.5

## 2019-01-05 MED ORDER — OXYCODONE HCL 5 MG PO TABS
10.0000 mg | ORAL_TABLET | Freq: Once | ORAL | Status: AC
  Administered 2019-01-05: 10 mg via ORAL

## 2019-01-05 MED ORDER — FAT EMULSION PLANT BASED 20 % IV EMUL
500.0000 mL | INTRAVENOUS | Status: AC
  Administered 2019-01-05: 500 mL via INTRAVENOUS
  Filled 2019-01-05: qty 500

## 2019-01-05 MED ORDER — HYDROMORPHONE HCL 1 MG/ML IJ SOLN
1.0000 mg | Freq: Once | INTRAMUSCULAR | Status: AC
  Administered 2019-01-05: 1 mg via INTRAVENOUS
  Filled 2019-01-05: qty 1

## 2019-01-05 MED ORDER — TRACE MINERALS CU-MN-SE-ZN 300-55-60-3000 MCG/ML IV SOLN
INTRAVENOUS | Status: AC
  Administered 2019-01-05: 18:00:00 via INTRAVENOUS
  Filled 2019-01-05: qty 1992

## 2019-01-05 MED ORDER — HYDROMORPHONE HCL 1 MG/ML IJ SOLN
0.5000 mg | Freq: Once | INTRAMUSCULAR | Status: AC
  Administered 2019-01-05: 19:00:00 0.5 mg via INTRAVENOUS

## 2019-01-05 MED ORDER — SILVER NITRATE-POT NITRATE 75-25 % EX MISC
2.0000 | Freq: Once | CUTANEOUS | Status: DC
  Filled 2019-01-05: qty 2

## 2019-01-05 NOTE — Consult Note (Signed)
PHARMACY - ADULT TOTAL PARENTERAL NUTRITION CONSULT NOTE   Pharmacy Consult for TPN management Indication: Inadequate oral intake related to acute illness(Post op ileus) as evidenced by NPO status  Patient Measurements: Height: 5\' 8"  (172.7 cm) Weight: 212 lb 8.4 oz (96.4 kg) IBW/kg (Calculated) : 68.4 TPN AdjBW (KG): 77.3 Body mass index is 32.31 kg/m.  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 Pt continues to be NPO. NGT in place with 1.3L output.   GI: LBM 11/01 Endo:  Insulin requirements in the past 24 hours: 3 units Lytes: all  WNL currently Renal: SCr 0.96--->0.68 Pulm: Cards:  Hepatobil: Neuro: ID: WBC 36.1--->19.9 Zosyn 10/26 >>  TPN Access: PICC line order placed 11/3 TPN start date: 11/3 Nutritional Goals (per RD recommendation on 11/3): KCal: 2352 kcal/day Protein: 100 g/day Fluid: 2292 mL  Goal TPN rate is 83 ml/hr   Current Nutrition: NPO  Plan:   Will continue TPN at goal rate, Clinimax E 5/20 TPN at 83 mL/hr  20% lipid emulsion at 25 mL/hr x 12 hours/day  This TPN at goal rate provides 100 g of protein, 192 g of dextrose, and 60 g of lipids which provides 2352 kCals per day, meeting 90% of patient needs  Electrolytes in TPN: no additional  Will discontinue IVF and have the TPN running only.  Add MVI, trace elements  and thiamine 100 mg/day x 3 days(has been completed) to TPN  Q6h sensitive SSI and adjust as needed  moderate refeeding risk; check P, K, and Mg daily for 3 days after TPN iniatition  F/U in am  Pearla Dubonnet, PharmD 01/05/2019,11:44 AM

## 2019-01-05 NOTE — Progress Notes (Signed)
Pharmacy Antibiotic Note  Brandon Mullen is a 43 y.o. male admitted on 12/25/2018 with Intra-abdominal infection.  Pharmacy has been consulted for Zosyn dosing.  Plan: Continue Zosyn 3.375g IV q8h (4 hour infusion).  Height: 5\' 8"  (172.7 cm) Weight: 212 lb 8.4 oz (96.4 kg) IBW/kg (Calculated) : 68.4  Temp (24hrs), Avg:98.5 F (36.9 C), Min:98.2 F (36.8 C), Max:98.9 F (37.2 C)  Recent Labs  Lab 12/31/18 0524 01/01/19 0544 01/02/19 0438 01/03/19 0508 01/04/19 0516 01/05/19 0436  WBC 28.9* 26.5* 24.8* 22.3* 21.8* 19.9*  CREATININE 0.83 0.71 0.60* 0.77 0.68  --     Estimated Creatinine Clearance: 131.3 mL/min (by C-G formula based on SCr of 0.68 mg/dL).    No Active Allergies  Antimicrobials this admission: Zosyn 10/26 >>   Thank you for allowing pharmacy to be a part of this patient's care.  Pearla Dubonnet, PharmD Clinical Pharmacist 01/05/2019 3:14 PM

## 2019-01-05 NOTE — TOC Initial Note (Signed)
Transition of Care [facility]) - Initial/Assessment Note    Patient Details  Name: Brandon Mullen MRN: [id number] Date of Birth: 08/02/1975  Transition of [facility]) CM/SW Contact:    Annamaria Boots, [place] Phone Number: 01/05/2019, 4:33 PM  Clinical Narrative:  Patient lives with his mother. Per patient, his mother provides transportation for him to appointments. Patient states that his primary care physician is Laneta Simmers at [facility] in [place]. Patient [facility] that [facility] [address] [facility] [place]. Patient will need home health RN and wound vac. RNCM has set up home health through [facility] and patient is agreeable. RNCM has also notified Brad with [employer] of wound vac need. Patient states that his mother will transport him home when medically stable. TOC team will follow for discharge planing.                   Expected Discharge Plan: [place] Barriers to Discharge: Continued Medical Work up   Patient Goals and CMS Choice Patient states their goals for this hospitalization and ongoing recovery are:: return home CMS Medicare.gov Compare Post Acute Care list provided to:: Patient Choice offered to / list presented to : Patient  Expected Discharge Plan and Services Expected Discharge Plan: [place] Choice: Home Health, Durable Medical Equipment Living arrangements for the past 2 months: Single Family Home                 DME Arranged: Negative pressure wound device DME Agency: [facility] Date DME Agency Contacted: 01/02/19   Representative spoke with at DME Agency: [place]: RN [place] Agency: [place] Date [place]: 01/04/19   Representative spoke with at [place]: Malachy Mood  Prior Living Arrangements/Services Living arrangements for the past 2 months: [place] Lives with:: Parents Patient language and need for interpreter reviewed:: Yes Do  you feel safe going back to the place where you live?: Yes      Need for Family Participation in Patient Care: Yes (Comment) Care giver support system in place?: Yes (comment)   Criminal Activity/Legal Involvement Pertinent to Current Situation/Hospitalization: No - Comment as needed  Activities of Daily Living Home Assistive Devices/Equipment: None ADL Screening (condition at time of admission) Patient's cognitive ability adequate to safely complete daily activities?: No Is the patient deaf or have difficulty hearing?: No Does the patient have difficulty seeing, even when wearing glasses/contacts?: No Does the patient have difficulty concentrating, remembering, or making decisions?: No Patient able to express need for assistance with ADLs?: Yes Does the patient have difficulty dressing or bathing?: No Independently performs ADLs?: Yes (appropriate for developmental age) Does the patient have difficulty walking or climbing stairs?: No Weakness of Legs: None Weakness of Arms/Hands: None  Permission Sought/Granted Permission sought to share information with : Case Manager, Customer service manager, Family Supports Permission granted to share information with : Yes, Verbal Permission Granted              Emotional Assessment Appearance:: Appears stated age Attitude/Demeanor/Rapport: Engaged Affect (typically observed): Hopeful Orientation: : Oriented to Self, Oriented to Place, Oriented to  Time, Oriented to Situation Alcohol / Substance Use: Not Applicable Psych Involvement: No (comment)  Admission diagnosis:  Pain [R52] Intra-abdominal abscess (HCC) [K65.1] Perforated diverticulum [K57.80] Lower abdominal pain [R10.30] Diverticulitis of intestine with abscess without bleeding, unspecified part of intestinal tract [K57.80] Patient Active Problem  List   Diagnosis Date Noted  . Diverticulitis of intestine with abscess without bleeding 12/25/2018  . Incisional hernia of  anterior abdominal wall without obstruction or gangrene 10/19/2016  . Alcohol abuse 06/23/2013  . Unspecified hypothyroidism 06/04/2013  . MDD (major depressive disorder), recurrent episode ([place]) 06/02/2013  . Anxiety state, unspecified 06/02/2013   PCP:  Patient, No Pcp Per Pharmacy:   [id number] [place], [place] [address] Phone: [phone] Fax: [phone]  [id number] [address], [place] [address] Phone: [phone] Fax: [phone]  [id number] [address], [place] [address] Phone: [phone] Fax: [phone]     Social Determinants of Health (SDOH) Interventions    Readmission Risk Interventions No flowsheet data found.

## 2019-01-05 NOTE — Progress Notes (Signed)
[place] Hospital Day(s): 11.   Post op day(s): 9 Days Post-Op.   Interval History:  Patient seen and examined, no acute events or new complaints overnight.  Patient reports he is feeling good. Abdominal soreness but that improved. No fever, chills, nausea, or emesis.  Leukocytosis continues to improve (19K) CT yesterday without any evidence of obstruction or abscess NGT output slowed to 200 ccs Good u/o - 2.6 L Colostomy with 100 ccs recorded but lots of gas.    Vital signs in last 24 hours: [min-max] current  Temp:  [98.3 F (36.8 C)-98.9 F (37.2 C)] 98.3 F (36.8 C) (11/06 0520) Pulse Rate:  [67-89] 67 (11/06 0520) Resp:  [20] 20 (11/06 0520) BP: (141-147)/(90-97) 142/93 (11/06 0520) SpO2:  [98 %] 98 % (11/06 0520)     Height: 5\' 8"  (172.7 cm) Weight: 96.4 kg BMI (Calculated): 32.32   Intake/Output last 2 shifts:  11/05 0701 - 11/06 0700 In: 1171.3 [I.V.:1050; IV Piggyback:121.3] Out: 2850 [Urine:2650; Emesis/NG output:200]   Physical Exam:  Constitutional: alert, cooperative and no distress HEENT:NGT inplace; output more bilious, slowing down, NGT flushes easily Respiratory: breathing non-labored at rest  Cardiovascular: regular rate and sinus rhythm  Gastrointestinal: soft,diffuse soreness, and non-distended, JP in RLQ with serosanguinous output. Colostomy in LLQ, pink, patent,minimalgas and bowel sweat in bag Integumentary:Wound vac to midline wound, good seal, serosanguinous Musculoskeletal:+ 2 Pitting edema bilaterally  Labs:  CBC Latest Ref Rng & Units 01/04/2019 01/03/2019 01/02/2019  WBC 4.0 - 10.5 K/uL 21.8(H) 22.3(H) 24.8(H)  Hemoglobin 13.0 - 17.0 g/dL 11.6(L) 12.8(L) 11.9(L)  Hematocrit 39.0 - 52.0 % 32.7(L) 38.3(L) 34.5(L)  Platelets 150 - 400 K/uL 620(H) 667(H) 565(H)   CMP Latest Ref Rng & Units 01/04/2019 01/03/2019 01/02/2019  Glucose 70 - 99 mg/dL 125(H) 117(H) 102(H)  BUN 6 - 20 mg/dL 10 11 13    Creatinine 0.61 - 1.24 mg/dL 0.68 0.77 0.60(L)  Sodium 135 - 145 mmol/L 137 136 135  Potassium 3.5 - 5.1 mmol/L 3.6 4.0 4.0  Chloride 98 - 111 mmol/L 102 100 101  CO2 22 - 32 mmol/L 26 25 24   Calcium 8.9 - 10.3 mg/dL 8.2(L) 8.3(L) 8.3(L)  Total Protein 6.5 - 8.1 g/dL 5.7(L) - -  Total Bilirubin 0.3 - 1.2 mg/dL 1.2 - -  Alkaline Phos 38 - 126 U/L 76 - -  AST 15 - 41 U/L 35 - -  ALT 0 - 44 U/L 43 - -     Imaging studies: No new pertinent imaging studies   Assessment/Plan:  43 y.o. male overall slowly improving awaiting significant return of bowel function 9 Days Post-Op s/p Hartman's for complicated diverticulitis with abscess that failed to clinically improve despite IV Abx and percutaneous drainage.   - Will clamp NGT today; check residual at 1230, if less than 150 ccs will remove  - Continue NPO + TPN; monitor nutritional labs   - Continue wound vac changes MWF; will change today - pain control prn; antiemetics prn  - Monitor leukocytosis; improving  - monitor abdominal examination; colostomy output - mobilize; encouraged to ambulate in halls - medical management of comorbidities  - DVT prophylaxis  All of the above findings and recommendations were discussed with the patient, and the medical team, and all of patient's family's questions were answered to his expressed satisfaction.  -- Edison Simon, PA-C [facility] 01/05/2019, 7:30 AM [phone] M-F: 7am - 4pm

## 2019-01-05 NOTE — Consult Note (Signed)
[place] Nurse ostomy follow up Patient receiving care in [facility]. 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  I am thrilled to report he did the entire pouch change process yesterday.  He did a beautiful job.  The skin barrier opening is sized perfectly.  He had a couple of questions that I answered.  No further care needs identified. Val Riles, RN, MSN, CWOCN, CNS-BC, pager [phone]

## 2019-01-05 NOTE — Progress Notes (Signed)
Patients wound vac noted to have large pooling of blood at dressing site. No excess drainage noted in cannister. VAC suction stopped. PA at bedside has changed VAC dressing, applied silver nitrae and per orders we will convert to wet to dry dressings for now and possibly reapply [facility] tomorrow. Per PA we will order daily dressing changes and PRN if bleeding. Also, NGT clamp trial complete, 100cc light green drainage out. Per PA we will clamp patient overnight with the expectation that we can removed NGT in am if drainage amount is appropriate.

## 2019-01-05 NOTE — Plan of Care (Signed)
  Problem: Pain Managment: Goal: General experience of comfort will improve Outcome: Progressing  Patient with frequent requests for pain medication. Pain controlled by PRn medications.

## 2019-01-06 LAB — GLUCOSE, CAPILLARY
Glucose-Capillary: 145 mg/dL — ABNORMAL HIGH (ref 70–99)
Glucose-Capillary: 105 mg/dL — ABNORMAL HIGH (ref 70–99)
Glucose-Capillary: 62 mg/dL — ABNORMAL LOW (ref 70–99)
Glucose-Capillary: 110 mg/dL — ABNORMAL HIGH (ref 70–99)

## 2019-01-06 LAB — CBC
HCT: 33.9 % — ABNORMAL LOW (ref 39.0–52.0)
Hemoglobin: 11.7 g/dL — ABNORMAL LOW (ref 13.0–17.0)
MCH: 30.4 pg (ref 26.0–34.0)
MCHC: 34.5 g/dL (ref 30.0–36.0)
MCV: 88.1 fL (ref 80.0–100.0)
Platelets: 724 10*3/uL — ABNORMAL HIGH (ref 150–400)
RBC: 3.85 MIL/uL — ABNORMAL LOW (ref 4.22–5.81)
RDW: 15.8 % — ABNORMAL HIGH (ref 11.5–15.5)
WBC: 18.2 10*3/uL — ABNORMAL HIGH (ref 4.0–10.5)
nRBC: 0.1 % (ref 0.0–0.2)

## 2019-01-06 LAB — PHOSPHORUS: Phosphorus: 3.7 mg/dL (ref 2.5–4.6)

## 2019-01-06 MED ORDER — FAT EMULSION PLANT BASED 20 % IV EMUL
500.0000 mL | INTRAVENOUS | Status: AC
  Administered 2019-01-06: 500 mL via INTRAVENOUS
  Filled 2019-01-06: qty 500

## 2019-01-06 MED ORDER — TRACE MINERALS CU-MN-SE-ZN 300-55-60-3000 MCG/ML IV SOLN
INTRAVENOUS | Status: AC
  Administered 2019-01-06: 19:00:00 via INTRAVENOUS
  Filled 2019-01-06: qty 1992

## 2019-01-06 NOTE — Consult Note (Signed)
PHARMACY - ADULT TOTAL PARENTERAL NUTRITION CONSULT NOTE   Pharmacy Consult for TPN management Indication: Inadequate oral intake related to acute illness(Post op ileus) as evidenced by NPO status  Patient Measurements: Height: 5\' 8"  (172.7 cm) Weight: 210 lb 1.6 oz (95.3 kg) IBW/kg (Calculated) : 68.4 TPN AdjBW (KG): 77.3 Body mass index is 31.95 kg/m.  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 Pt continues to be NPO. NGT in place with 1.3L output.   GI: LBM 11/01 Endo:  Insulin requirements in the past 24 hours: 3 units Lytes: all  WNL currently Renal: SCr 0.96--->0.68 Pulm: Cards:  Hepatobil: Neuro: ID: WBC 36.1--->19.9 Zosyn 10/26 >>  TPN Access: PICC line order placed 11/3 TPN start date: 11/3 Nutritional Goals (per RD recommendation on 11/3): KCal: 2352 kcal/day Protein: 100 g/day Fluid: 2292 mL  Goal TPN rate is 83 ml/hr   Current Nutrition: NPO  Plan:   Will continue TPN at goal rate, Clinimax E 5/20 TPN at 83 mL/hr  20% lipid emulsion at 25 mL/hr x 12 hours/day  This TPN at goal rate provides 100 g of protein, 192 g of dextrose, and 60 g of lipids which provides 2352 kCals per day, meeting 90% of patient needs  Electrolytes in TPN: no additional  Will discontinue IVF and have the TPN running only.  Add MVI, trace elements  and thiamine 100 mg/day x 3 days(has been completed) to TPN  Q6h sensitive SSI and adjust as needed  moderate refeeding risk; check P, K, and Mg daily for 3 days after TPN iniatition  F/U in am  Oswald Hillock, PharmD, BCPS  01/06/2019,10:54 AM

## 2019-01-06 NOTE — Progress Notes (Addendum)
[place] Hospital Day(s): 12.   Post op day(s): 10 Days Post-Op.   Interval History: Patient seen and examined, no acute events or new complaints overnight. Patient reports feeling better today.  He reported he continue passing gas per ostomy.  He denies passing stool yet.  He denies nausea or vomiting.  NGT has been clamped since yesterday.  There is no pain radiation.  There is no alleviating or aggravating factor.  Vital signs in last 24 hours: [min-max] current  Temp:  [97.9 F (36.6 C)-98.4 F (36.9 C)] 97.9 F (36.6 C) (11/07 0459) Pulse Rate:  [76-88] 76 (11/07 0459) Resp:  [16-20] 20 (11/07 0459) BP: (125-144)/(87-93) 125/90 (11/07 0923) SpO2:  [95 %-96 %] 95 % (11/07 0459) Weight:  [95.3 kg] 95.3 kg (11/07 0459)     Height: 5\' 8"  (172.7 cm) Weight: 95.3 kg BMI (Calculated): 31.95    Physical Exam:  Constitutional: alert, cooperative and no distress  Respiratory: breathing non-labored at rest  Cardiovascular: regular rate and sinus rhythm  Gastrointestinal: soft, non-tender, and non-distended.  Wound is dry and clean.    Labs:  CBC Latest Ref Rng & Units 01/06/2019 01/05/2019 01/04/2019  WBC 4.0 - 10.5 K/uL 18.2(H) 19.9(H) 21.8(H)  Hemoglobin 13.0 - 17.0 g/dL 11.7(L) 11.6(L) 11.6(L)  Hematocrit 39.0 - 52.0 % 33.9(L) 34.5(L) 32.7(L)  Platelets 150 - 400 K/uL 724(H) 696(H) 620(H)   CMP Latest Ref Rng & Units 01/04/2019 01/03/2019 01/02/2019  Glucose 70 - 99 mg/dL 125(H) 117(H) 102(H)  BUN 6 - 20 mg/dL 10 11 13   Creatinine 0.61 - 1.24 mg/dL 0.68 0.77 0.60(L)  Sodium 135 - 145 mmol/L 137 136 135  Potassium 3.5 - 5.1 mmol/L 3.6 4.0 4.0  Chloride 98 - 111 mmol/L 102 100 101  CO2 22 - 32 mmol/L 26 25 24   Calcium 8.9 - 10.3 mg/dL 8.2(L) 8.3(L) 8.3(L)  Total Protein 6.5 - 8.1 g/dL 5.7(L) - -  Total Bilirubin 0.3 - 1.2 mg/dL 1.2 - -  Alkaline Phos 38 - 126 U/L 76 - -  AST 15 - 41 U/L 35 - -  ALT 0 - 44 U/L 43 - -    Imaging studies: No new pertinent  imaging studies   Assessment/Plan:  43 year old male with acute diverticulitis s/p partial colectomy and colostomy postop day #10 with pertinent comorbidities including alcohol abuse, hypertension, hypothyroidism. Patient recovering slowly.  He has been without nausea or vomiting since yesterday even though the NGT has been clamped.  He endorses passing gas but no stool.  Will measure NGT residual and if low will consider removing it and start clear liquid diet.  Patient understand that he developed nausea and vomiting will need to have NG tube replaced.  We will continue with TPN until patient start to tolerate better diet and have significant caloric intake.  Otherwise we will continue with IV antibiotic therapy.  Will replace wound VAC system.  I personally placed the wound VAC system today.  Encourage patient to ambulate.  We will continue with DVT prophylaxis.  Arnold Long, MD

## 2019-01-07 LAB — BASIC METABOLIC PANEL
Anion gap: 7 (ref 5–15)
BUN: 10 mg/dL (ref 6–20)
CO2: 27 mmol/L (ref 22–32)
Calcium: 8.5 mg/dL — ABNORMAL LOW (ref 8.9–10.3)
Chloride: 101 mmol/L (ref 98–111)
Creatinine, Ser: 0.68 mg/dL (ref 0.61–1.24)
GFR calc Af Amer: >60 mL/min (ref >60)
GFR calc non Af Amer: >60 mL/min (ref >60)
Glucose, Bld: 119 mg/dL — ABNORMAL HIGH (ref 70–99)
Potassium: 4.4 mmol/L (ref 3.5–5.1)
Sodium: 135 mmol/L (ref 135–145)

## 2019-01-07 LAB — GLUCOSE, CAPILLARY
Glucose-Capillary: 110 mg/dL — ABNORMAL HIGH (ref 70–99)
Glucose-Capillary: 116 mg/dL — ABNORMAL HIGH (ref 70–99)
Glucose-Capillary: 122 mg/dL — ABNORMAL HIGH (ref 70–99)
Glucose-Capillary: 124 mg/dL — ABNORMAL HIGH (ref 70–99)
Glucose-Capillary: 145 mg/dL — ABNORMAL HIGH (ref 70–99)

## 2019-01-07 LAB — PHOSPHORUS: Phosphorus: 3.6 mg/dL (ref 2.5–4.6)

## 2019-01-07 MED ORDER — FAT EMULSION PLANT BASED 20 % IV EMUL
500.0000 mL | INTRAVENOUS | Status: AC
  Administered 2019-01-07: 500 mL via INTRAVENOUS
  Filled 2019-01-07: qty 500

## 2019-01-07 MED ORDER — TRACE MINERALS CU-MN-SE-ZN 300-55-60-3000 MCG/ML IV SOLN
INTRAVENOUS | Status: AC
  Administered 2019-01-07: 18:00:00 via INTRAVENOUS
  Filled 2019-01-07: qty 960

## 2019-01-07 NOTE — Progress Notes (Signed)
[place] Hospital Day(s): 13.   Post op day(s): 11 Days Post-Op.   Interval History: Patient seen and examined, no acute events or new complaints overnight. Patient reports feeling better today.  Reports that he started passing stool yesterday.  He reports that the pain is mainly on the midline wound.  There is no pain radiation.  There is no alleviating or aggravating factor.  Patient denies nausea or vomiting.  Vital signs in last 24 hours: [min-max] current  Temp:  [98.4 F (36.9 C)-98.7 F (37.1 C)] 98.4 F (36.9 C) (11/08 0508) Pulse Rate:  [80-92] 80 (11/08 0508) Resp:  [15-16] 16 (11/08 0508) BP: (125-132)/(84-91) 129/84 (11/08 0508) SpO2:  [97 %-100 %] 97 % (11/08 0508)     Height: 5\' 8"  (172.7 cm) Weight: 95.3 kg BMI (Calculated): 31.95   Physical Exam:  Constitutional: alert, cooperative and no distress  Respiratory: breathing non-labored at rest  Cardiovascular: regular rate and sinus rhythm  Gastrointestinal: soft, non-tender, and non-distended.  Midline wound with wound VAC in place.  Labs:  CBC Latest Ref Rng & Units 01/06/2019 01/05/2019 01/04/2019  WBC 4.0 - 10.5 K/uL 18.2(H) 19.9(H) 21.8(H)  Hemoglobin 13.0 - 17.0 g/dL 11.7(L) 11.6(L) 11.6(L)  Hematocrit 39.0 - 52.0 % 33.9(L) 34.5(L) 32.7(L)  Platelets 150 - 400 K/uL 724(H) 696(H) 620(H)   CMP Latest Ref Rng & Units 01/07/2019 01/04/2019 01/03/2019  Glucose 70 - 99 mg/dL 119(H) 125(H) 117(H)  BUN 6 - 20 mg/dL 10 10 11   Creatinine 0.61 - 1.24 mg/dL 0.68 0.68 0.77  Sodium 135 - 145 mmol/L 135 137 136  Potassium 3.5 - 5.1 mmol/L 4.4 3.6 4.0  Chloride 98 - 111 mmol/L 101 102 100  CO2 22 - 32 mmol/L 27 26 25   Calcium 8.9 - 10.3 mg/dL 8.5(L) 8.2(L) 8.3(L)  Total Protein 6.5 - 8.1 g/dL - 5.7(L) -  Total Bilirubin 0.3 - 1.2 mg/dL - 1.2 -  Alkaline Phos 38 - 126 U/L - 76 -  AST 15 - 41 U/L - 35 -  ALT 0 - 44 U/L - 43 -    Imaging studies: No new pertinent imaging studies   Assessment/Plan:   43 year old male with acute diverticulitis s/p partial colectomy and colostomy postop day #11 with pertinent comorbidities including alcohol abuse, hypertension, hypothyroidism. Patient with significant improvement since yesterday.  He is now passing stool through the ostomy.  Will advance diet to full liquids.  We'll start decreasing TPN to have.  The midline wound has another episode of bleeding yesterday and wound VAC is changed.  Today looking better.  No sign of bleeding at this moment.  We'll continue pain management.  We'll continue with IV antibiotic.  We'll continue with DVT prophylaxis.  Encourage patient to ambulate.   Arnold Long, MD

## 2019-01-07 NOTE — Plan of Care (Signed)
Patient doing well today.  Pain being controlled with Morphine and Oxycodone.  Wound vac to incision is intact with no leaking, putting out sanguinous output.  JP with minimal output.  Colostomy is putting out flatus and small amount of liquid stool.  Diet advanced to full liquid and tolerating it well.  Patient has ambulated in the hallway today.  No significant changes.

## 2019-01-07 NOTE — Consult Note (Signed)
PHARMACY - ADULT TOTAL PARENTERAL NUTRITION CONSULT NOTE   Pharmacy Consult for TPN management Indication: Inadequate oral intake related to acute illness(Post op ileus) as evidenced by NPO status  Patient Measurements: Height: 5\' 8"  (172.7 cm) Weight: 210 lb 1.6 oz (95.3 kg) IBW/kg (Calculated) : 68.4 TPN AdjBW (KG): 77.3 Body mass index is 31.95 kg/m.  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 Pt continues to be NPO. NGT in place with 1.3L output.   GI: LBM 11/01 Endo:  Insulin requirements in the past 24 hours: 3 units Lytes: all  WNL currently Renal: SCr 0.96--->0.68 Pulm: Cards:  Hepatobil: Neuro: ID: WBC 36.1--->19.9 Zosyn 10/26 >>  TPN Access: PICC line order placed 11/3 TPN start date: 11/3 Nutritional Goals (per RD recommendation on 11/3): KCal: 2352 kcal/day Protein: 100 g/day Fluid: 2292 mL  Goal TPN rate is 83 ml/hr   Current Nutrition: NPO  Plan:   Will decrease TPN (Clinimax E 5/20) to 40 ml/hr  20% lipid emulsion at 25 mL/hr x 12 hours/day  This TPN at goal rate provides 100 g of protein, 192 g of dextrose, and 60 g of lipids which provides 2352 kCals per day, meeting 90% of patient needs  Electrolytes in TPN: no additional  Will discontinue IVF and have the TPN running only.  Add MVI, trace elements  and thiamine 100 mg/day x 3 days(has been completed) to TPN  Q6h sensitive SSI and adjust as needed  moderate refeeding risk; check P, K, and Mg daily for 3 days after TPN iniatition  F/U in am  Oswald Hillock, PharmD, BCPS  01/07/2019,10:42 AM

## 2019-01-08 LAB — COMPREHENSIVE METABOLIC PANEL
ALT: 36 U/L (ref 0–44)
AST: 21 U/L (ref 15–41)
Albumin: 2.8 g/dL — ABNORMAL LOW (ref 3.5–5.0)
Alkaline Phosphatase: 125 U/L (ref 38–126)
Anion gap: 10 (ref 5–15)
BUN: 8 mg/dL (ref 6–20)
CO2: 25 mmol/L (ref 22–32)
Calcium: 8.5 mg/dL — ABNORMAL LOW (ref 8.9–10.3)
Chloride: 99 mmol/L (ref 98–111)
Creatinine, Ser: 0.71 mg/dL (ref 0.61–1.24)
GFR calc Af Amer: >60 mL/min (ref >60)
GFR calc non Af Amer: >60 mL/min (ref >60)
Glucose, Bld: 156 mg/dL — ABNORMAL HIGH (ref 70–99)
Potassium: 3.9 mmol/L (ref 3.5–5.1)
Sodium: 134 mmol/L — ABNORMAL LOW (ref 135–145)
Total Bilirubin: 0.8 mg/dL (ref 0.3–1.2)
Total Protein: 6.3 g/dL — ABNORMAL LOW (ref 6.5–8.1)

## 2019-01-08 LAB — DIFFERENTIAL
Abs Immature Granulocytes: 0.15 10*3/uL — ABNORMAL HIGH (ref 0.00–0.07)
Basophils Absolute: 0.2 10*3/uL — ABNORMAL HIGH (ref 0.0–0.1)
Basophils Relative: 1 %
Eosinophils Absolute: 0.7 10*3/uL — ABNORMAL HIGH (ref 0.0–0.5)
Eosinophils Relative: 6 %
Immature Granulocytes: 1 %
Lymphocytes Relative: 17 %
Lymphs Abs: 2.1 10*3/uL (ref 0.7–4.0)
Monocytes Absolute: 1.3 10*3/uL — ABNORMAL HIGH (ref 0.1–1.0)
Monocytes Relative: 11 %
Neutro Abs: 7.8 10*3/uL — ABNORMAL HIGH (ref 1.7–7.7)
Neutrophils Relative %: 64 %

## 2019-01-08 LAB — CBC
HCT: 36.3 % — ABNORMAL LOW (ref 39.0–52.0)
Hemoglobin: 12.1 g/dL — ABNORMAL LOW (ref 13.0–17.0)
MCH: 30 pg (ref 26.0–34.0)
MCHC: 33.3 g/dL (ref 30.0–36.0)
MCV: 90.1 fL (ref 80.0–100.0)
Platelets: 817 10*3/uL — ABNORMAL HIGH (ref 150–400)
RBC: 4.03 MIL/uL — ABNORMAL LOW (ref 4.22–5.81)
RDW: 15.5 % (ref 11.5–15.5)
WBC: 12.2 10*3/uL — ABNORMAL HIGH (ref 4.0–10.5)
nRBC: 0 % (ref 0.0–0.2)

## 2019-01-08 LAB — TRIGLYCERIDES: Triglycerides: 202 mg/dL — ABNORMAL HIGH (ref <150)

## 2019-01-08 LAB — PREALBUMIN: Prealbumin: 44.7 mg/dL — ABNORMAL HIGH (ref 18–38)

## 2019-01-08 LAB — GLUCOSE, CAPILLARY
Glucose-Capillary: 150 mg/dL — ABNORMAL HIGH (ref 70–99)
Glucose-Capillary: 92 mg/dL (ref 70–99)
Glucose-Capillary: 133 mg/dL — ABNORMAL HIGH (ref 70–99)

## 2019-01-08 LAB — MAGNESIUM: Magnesium: 2 mg/dL (ref 1.7–2.4)

## 2019-01-08 LAB — PHOSPHORUS: Phosphorus: 3.5 mg/dL (ref 2.5–4.6)

## 2019-01-08 MED ORDER — ENSURE ENLIVE PO LIQD
237.0000 mL | Freq: Two times a day (BID) | ORAL | Status: DC
  Administered 2019-01-08 – 2019-01-10 (×6): 237 mL via ORAL

## 2019-01-08 MED ORDER — TRACE MINERALS CU-MN-SE-ZN 300-55-60-3000 MCG/ML IV SOLN
INTRAVENOUS | Status: DC
  Administered 2019-01-08: 19:00:00 via INTRAVENOUS
  Filled 2019-01-08: qty 960

## 2019-01-08 MED ORDER — FAT EMULSION PLANT BASED 20 % IV EMUL
500.0000 mL | INTRAVENOUS | Status: DC
  Administered 2019-01-08: 500 mL via INTRAVENOUS
  Filled 2019-01-08: qty 500

## 2019-01-08 MED ORDER — HYDROMORPHONE HCL 1 MG/ML IJ SOLN
INTRAMUSCULAR | Status: AC
  Administered 2019-01-08: 0.5 mg
  Filled 2019-01-08: qty 1

## 2019-01-08 NOTE — Consult Note (Signed)
PHARMACY - ADULT TOTAL PARENTERAL NUTRITION CONSULT NOTE   Pharmacy Consult for TPN management Indication: Inadequate oral intake related to acute illness(Post op ileus) as evidenced by NPO status  Patient Measurements: Height: 5\' 8"  (172.7 cm) Weight: 210 lb 1.6 oz (95.3 kg) IBW/kg (Calculated) : 68.4 TPN AdjBW (KG): 77.3 Body mass index is 31.95 kg/m.  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 Pt continues to be NPO. NGT in place with 1.3L output.   GI: LBM 11/08 Endo:  SSI Q6H Insulin requirements in the past 24 hours: 3 units Lytes: all  WNL currently Renal: SCr 0.96--->0.68 > 0.71 Pulm: Cards:  Hepatobil: Neuro: ID: WBC 36.1--->19.9 Zosyn 10/26 >>   Per surgery, Zosyn will end on 11/11 (Wednesday)  TPN Access: PICC line order placed 11/3 TPN start date: 11/3 Nutritional Goals (per RD recommendation on 11/3): KCal: 2352 kcal/day Protein: 100 g/day Fluid: 2292 mL  Goal TPN rate is 40 ml/hr   Current Nutrition: NPO  Plan:   Will continue decrease TPN (Clinimax E 5/20)  40 ml/hr  20% lipid emulsion at 25 mL/hr x 12 hours/day  This TPN at goal rate provides 100 g of protein, 192 g of dextrose, and 60 g of lipids which provides 2352 kCals per day, meeting 90% of patient needs  Electrolytes in TPN: no additional  Will discontinue IVF and have the TPN running only.  Add MVI, trace elements  and thiamine 100 mg/day x 3 days (has been completed) to TPN  Q6h sensitive SSI and adjust as needed  moderate refeeding risk; check P, K, and Mg daily for 3 days after TPN iniatition  F/U in am  Rowland Lathe, PharmD 01/08/2019,7:48 AM

## 2019-01-08 NOTE — Progress Notes (Signed)
Pharmacy Antibiotic Note  Brandon Mullen is a 43 y.o. male admitted on 12/25/2018 with Intra-abdominal infection.  Pharmacy has been consulted for Zosyn dosing.  Plan: Continue Zosyn 3.375g IV q8h (4 hour infusion). Per surgery, Zosyn will end on 11/11 (Wednesday)  Height: 5\' 8"  (172.7 cm) Weight: 210 lb 1.6 oz (95.3 kg) IBW/kg (Calculated) : 68.4  Temp (24hrs), Avg:98.3 F (36.8 C), Min:98 F (36.7 C), Max:98.6 F (37 C)  Recent Labs  Lab 01/02/19 0438 01/03/19 0508 01/04/19 0516 01/05/19 0436 01/06/19 0540 01/07/19 0708 01/08/19 0459  WBC 24.8* 22.3* 21.8* 19.9* 18.2*  --  12.2*  CREATININE 0.60* 0.77 0.68  --   --  0.68 0.71    Estimated Creatinine Clearance: 130.6 mL/min (by C-G formula based on SCr of 0.71 mg/dL).    No Active Allergies  Antimicrobials this admission: Zosyn 10/26 >>   Thank you for allowing pharmacy to be a part of this patient's care.  Rowland Lathe, PharmD Clinical Pharmacist 01/08/2019 10:54 AM

## 2019-01-08 NOTE — Consult Note (Signed)
[place] Nurse ostomy follow up HAS NPWT (midline) in place.  Surgery to change this.  Stoma type/location: LLQ colostomy Stomal assessment/size:  Not changed. Pouch intact.  Patient independent with care.  Supplies in room.  Spoke with patient about life with an ostomy.  He works on Actuary at [employer] and has a history of a previous hernia with Best boy.  Discussed risk of another hernia after abdominal surgery with heavy lifting and exertion.  Recommended to wear an abdominal binder at work.  Offered to get him one.  He has one at home from previous hernia and will wear it.   Education provided: see above Enrolled patient in [employer] Discharge program: Yes kit has arrived at home. Supplies in the room as well.  Will not follow at this time.  Please re-consult if needed.  Domenic Moras MSN, RN, FNP-BC CWON Wound, Ostomy, Continence Nurse Pager [phone]

## 2019-01-08 NOTE — Progress Notes (Signed)
[facility] Day(s): [place] op day(s): 12 Days Post-Op.   Interval History:  Patient seen and examined no acute events or new complaints overnight.  Patient reports he is doing well. Pian to midline improving. No fever, chills, nausea, or emesis Still with gas in colostomy. Had stool yesterday but none this morning.  Leukocytosis improved to 12K Good U/O Mobilizing   Vital signs in last 24 hours: [min-max] current  Temp:  [98 F (36.7 C)-98.6 F (37 C)] 98 F (36.7 C) (11/09 0501) Pulse Rate:  [76-88] 76 (11/09 0501) Resp:  [15-20] 20 (11/09 0501) BP: (116-127)/(76-93) 116/82 (11/09 0501) SpO2:  [97 %-98 %] 97 % (11/09 0501)     Height: 5\' 8"  (172.7 cm) Weight: 95.3 kg BMI (Calculated): 31.95   Intake/Output last 2 shifts:  11/08 0701 - 11/09 0700 In: 2365.2 [P.O.:360; I.V.:1827.6; IV Piggyback:177.6] Out: 3835 [Urine:3575; Drains:260]   Physical Exam:  Constitutional: alert, cooperative and no distress Respiratory: breathing non-labored at rest  Cardiovascular: regular rate and sinus rhythm  Gastrointestinal: soft,diffuse soreness, and non-distended, JP in RLQ with serosanguinous output. Colostomy in LLQ, pink, patent, gas and bowel sweat in bag Integumentary:Wound vac to midline wound, good seal, serosanguinous   Labs:  CBC Latest Ref Rng & Units 01/08/2019 01/06/2019 01/05/2019  WBC 4.0 - 10.5 K/uL 12.2(H) 18.2(H) 19.9(H)  Hemoglobin 13.0 - 17.0 g/dL 12.1(L) 11.7(L) 11.6(L)  Hematocrit 39.0 - 52.0 % 36.3(L) 33.9(L) 34.5(L)  Platelets 150 - 400 K/uL 817(H) 724(H) 696(H)   CMP Latest Ref Rng & Units 01/08/2019 01/07/2019 01/04/2019  Glucose 70 - 99 mg/dL 156(H) 119(H) 125(H)  BUN 6 - 20 mg/dL 8 10 10   Creatinine 0.61 - 1.24 mg/dL 0.71 0.68 0.68  Sodium 135 - 145 mmol/L 134(L) 135 137  Potassium 3.5 - 5.1 mmol/L 3.9 4.4 3.6  Chloride 98 - 111 mmol/L 99 101 102  CO2 22 - 32 mmol/L 25 27 26   Calcium 8.9 - 10.3  mg/dL 8.5(L) 8.5(L) 8.2(L)  Total Protein 6.5 - 8.1 g/dL 6.3(L) - 5.7(L)  Total Bilirubin 0.3 - 1.2 mg/dL 0.8 - 1.2  Alkaline Phos 38 - 126 U/L 125 - 76  AST 15 - 41 U/L 21 - 35  ALT 0 - 44 U/L 36 - 43    Imaging studies: No new pertinent imaging studies   Assessment/Plan:  43 y.o. male overall doing well with signs of significant bowel function return 12 Days Post-Op s/pHartman's for complicated diverticulitis with abscess that failed to clinically improve despite IV Abx and percutaneous drainage.   - Continue full liquid diet today; if having stool then can advance to soft diet             - 1/2 rate TPN; monitor nutritional labs   - Continue IV Abx (Zosyn >> Day 14 --> likely stop on Wednesday 11/11 as he will be 14 days post-op)             - Continue wound vac changes MWF; will change today - pain control prn; antiemetics prn             - Monitor leukocytosis; improving  - monitor abdominal examination; colostomy output - mobilize; encouraged to ambulate in halls - medical management of comorbidities  - DVT prophylaxis  All of the above findings and recommendations were discussed with the patient, and the medical team, and all of patient's questions were answered to his expressed satisfaction.  -- Edison Simon,  PA-C [facility] 01/08/2019, 7:12 AM [phone] M-F: 7am - 4pm

## 2019-01-09 LAB — GLUCOSE, CAPILLARY
Glucose-Capillary: 127 mg/dL — ABNORMAL HIGH (ref 70–99)
Glucose-Capillary: 129 mg/dL — ABNORMAL HIGH (ref 70–99)
Glucose-Capillary: 132 mg/dL — ABNORMAL HIGH (ref 70–99)
Glucose-Capillary: 138 mg/dL — ABNORMAL HIGH (ref 70–99)

## 2019-01-09 LAB — PHOSPHORUS: Phosphorus: 3.8 mg/dL (ref 2.5–4.6)

## 2019-01-09 LAB — CBC
HCT: 36 % — ABNORMAL LOW (ref 39.0–52.0)
Hemoglobin: 13.4 g/dL (ref 13.0–17.0)
MCH: 35.8 pg — ABNORMAL HIGH (ref 26.0–34.0)
MCHC: 37.2 g/dL — ABNORMAL HIGH (ref 30.0–36.0)
MCV: 96.3 fL (ref 80.0–100.0)
Platelets: 765 10*3/uL — ABNORMAL HIGH (ref 150–400)
RBC: 3.74 MIL/uL — ABNORMAL LOW (ref 4.22–5.81)
RDW: 17.1 % — ABNORMAL HIGH (ref 11.5–15.5)
WBC: 9.8 10*3/uL (ref 4.0–10.5)
nRBC: 0.2 % (ref 0.0–0.2)

## 2019-01-09 LAB — BASIC METABOLIC PANEL
Anion gap: 9 (ref 5–15)
BUN: 8 mg/dL (ref 6–20)
CO2: 29 mmol/L (ref 22–32)
Calcium: 9.3 mg/dL (ref 8.9–10.3)
Chloride: 99 mmol/L (ref 98–111)
Creatinine, Ser: 0.79 mg/dL (ref 0.61–1.24)
GFR calc Af Amer: >60 mL/min (ref >60)
GFR calc non Af Amer: >60 mL/min (ref >60)
Glucose, Bld: 121 mg/dL — ABNORMAL HIGH (ref 70–99)
Potassium: 4.9 mmol/L (ref 3.5–5.1)
Sodium: 137 mmol/L (ref 135–145)

## 2019-01-09 LAB — MAGNESIUM: Magnesium: 2.2 mg/dL (ref 1.7–2.4)

## 2019-01-09 MED ORDER — POLYETHYLENE GLYCOL 3350 17 G PO PACK
17.0000 g | PACK | Freq: Every day | ORAL | Status: DC
  Administered 2019-01-09 – 2019-01-10 (×2): 17 g via ORAL
  Filled 2019-01-09 (×2): qty 1

## 2019-01-09 MED ORDER — HYDROMORPHONE HCL 1 MG/ML IJ SOLN
0.5000 mg | INTRAMUSCULAR | Status: DC | PRN
  Administered 2019-01-09 – 2019-01-10 (×5): 0.5 mg via INTRAVENOUS
  Filled 2019-01-09 (×6): qty 1

## 2019-01-09 NOTE — Progress Notes (Signed)
[place] Hospital Day(s): 15.   Post op day(s): 13 Days Post-Op.   Interval History:  Patient seen and examined No acute events or new complaints overnight.  Patient reports he has incisional soreness, no nausea, emesis, fever, chills.  Still only flatus from colostomy Leukocytosis normalized Good U/O  Tolerating full liquid diet 1/2 rate TPN   Vital signs in last 24 hours: [min-max] current  Temp:  [97.5 F (36.4 C)-98.4 F (36.9 C)] 98.2 F (36.8 C) (11/10 0456) Pulse Rate:  [86-102] 89 (11/10 0456) Resp:  [16-20] 20 (11/10 0456) BP: (122-132)/(82-88) 122/86 (11/10 0456) SpO2:  [90 %-99 %] 90 % (11/10 0456)     Height: 5\' 8"  (172.7 cm) Weight: 95.3 kg BMI (Calculated): 31.95   Intake/Output last 2 shifts:  11/09 0701 - 11/10 0700 In: [id number] [P.O.:240; I.V.:1019.9; IV Piggyback:239.1] Out: 1600 [Urine:1600]   Physical Exam:  Constitutional: alert, cooperative and no distress Respiratory: breathing non-labored at rest  Cardiovascular: regular rate and sinus rhythm  Gastrointestinal: soft,diffuse soreness, and non-distended, JP in RLQ with serosanguinous output. Colostomy in LLQ, pink, patent, gas and bowel sweat in bag Integumentary:Wound vac to midline wound, good seal, serosanguinous  Labs:  CBC Latest Ref Rng & Units 01/08/2019 01/06/2019 01/05/2019  WBC 4.0 - 10.5 K/uL 12.2(H) 18.2(H) 19.9(H)  Hemoglobin 13.0 - 17.0 g/dL 12.1(L) 11.7(L) 11.6(L)  Hematocrit 39.0 - 52.0 % 36.3(L) 33.9(L) 34.5(L)  Platelets 150 - 400 K/uL 817(H) 724(H) 696(H)   CMP Latest Ref Rng & Units 01/08/2019 01/07/2019 01/04/2019  Glucose 70 - 99 mg/dL 156(H) 119(H) 125(H)  BUN 6 - 20 mg/dL 8 10 10   Creatinine 0.61 - 1.24 mg/dL 0.71 0.68 0.68  Sodium 135 - 145 mmol/L 134(L) 135 137  Potassium 3.5 - 5.1 mmol/L 3.9 4.4 3.6  Chloride 98 - 111 mmol/L 99 101 102  CO2 22 - 32 mmol/L 25 27 26   Calcium 8.9 - 10.3 mg/dL 8.5(L) 8.5(L) 8.2(L)  Total Protein  6.5 - 8.1 g/dL 6.3(L) - 5.7(L)  Total Bilirubin 0.3 - 1.2 mg/dL 0.8 - 1.2  Alkaline Phos 38 - 126 U/L 125 - 76  AST 15 - 41 U/L 21 - 35  ALT 0 - 44 U/L 36 - 43     Imaging studies: No new pertinent imaging studies   Assessment/Plan:  43 y.o. male overall doing well 13 Days Post-Op s/p Hartman's for complicated diverticulitis with abscess that failed to clinically improve despite IV Abx and percutaneous drainage.   - Advance to soft diet - 1/2 rate TPN for now; if significant stool toay we can taper to discontinue; monitor nutritional labs             - Continue IV Abx (Zosyn >> Day 14 --> likely stop on Wednesday 11/11 as he will be 14 days post-op)  - Add Miralax - Continue wound vac changes MWF; will change tomorrow - pain control prn; antiemetics prn - Monitor leukocytosis; improving - monitor abdominal examination; colostomy output - mobilize; encouraged to ambulate in halls - medical management of comorbidities   - Home Health orders placed today - DVT prophylaxis  All of the above findings and recommendations were discussed with the patient, and the medical team, and all of patient's questions were answered to his expressed satisfaction.  -- Edison Simon, PA-C [facility] 01/09/2019, 7:28 AM [phone] M-F: 7am - 4pm

## 2019-01-09 NOTE — Progress Notes (Signed)
Nutrition Follow Up Note   DOCUMENTATION CODES:   Obesity unspecified  INTERVENTION:    Discontinue TPN  Ensure Enlive po BID, each supplement provides 350 kcal and 20 grams of protein  NUTRITION DIAGNOSIS:   Inadequate oral intake related to acute illness(Post op ileus) as evidenced by NPO status. 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 Refeed labs stable. Pt is drinking Ensure supplements. Will plan to discontinue TPN today. Minimal ouput via ostomy. Pt is having flatus. UOP 1.6L. Per chart, pt down ~19lbs since admit; pt still ~13lbs up from his UBW.   Medications reviewed and include: lovenox, insulin, synthroid, protonix, miralax, zosyn   Labs reviewed: K 4.9 wnl, P 3.8 wnl, Mg 2.2 wnl Prealbumin- 44.7(H) Triglycerides- 202(H) cbgs- 129, 132, 127 x 24 hrs  Diet Order:   Diet Order            DIET SOFT Room service appropriate? Yes; Fluid consistency: Thin  Diet effective now             EDUCATION NEEDS:   No education needs have been identified at this time  Skin:  Skin Assessment: Reviewed RN Assessment(incision abdomen with VAC)  Last BM:  183m via colostomy   Height:   Ht Readings from Last 1 Encounters:  12/27/18 5' 8"  (1.727 m)    Weight:   Wt Readings from Last 1 Encounters:  01/06/19 95.3 kg    Ideal Body Weight:  70 kg  BMI:  Body mass index is 31.95 kg/m.  Estimated Nutritional Needs:   Kcal:  2100-2400kcal/day  Protein:  105-120g/day  Fluid:  >2.1L/day  CKoleen DistanceMS, RD, LDN Pager #- [phone]#- [phone]  Hours Pager: [phone]

## 2019-01-10 LAB — PHOSPHORUS: Phosphorus: 3.9 mg/dL (ref 2.5–4.6)

## 2019-01-10 MED ORDER — OXYCODONE HCL 5 MG PO TABS: 5.0000 mg | ORAL_TABLET | Freq: Four times a day (QID) | ORAL | 0 refills | Status: DC | PRN

## 2019-01-10 MED ORDER — IBUPROFEN 800 MG PO TABS: 800.0000 mg | ORAL_TABLET | Freq: Three times a day (TID) | ORAL | 0 refills | Status: DC | PRN

## 2019-01-10 NOTE — Discharge Summary (Signed)
[facility] SURGICAL DISCHARGE SUMMARY   Patient ID: Brandon Mullen MRN: [id number] DOB/AGE: [birth year] 43 y.o.  Admit date: 12/25/2018 Discharge date: 01/10/2019  Discharge Diagnoses Patient Active Problem List   Diagnosis Date Noted  . Diverticulitis of intestine with abscess without bleeding 12/25/2018    Consultants Non  Procedures 12/27/2018:  Exploratory Laparotomy, extensive lysis of adhesions, partial greater omentectomy, sigmoidectomy with end colostomy, wound vac placement.   HPI: 43 y.o. male presented to [facility] ED today for abdominal pain. Patient reports ~3 weeks of lower abdominal pain, worse in the suprapubic region. He described this as a crampy and aching pain. Worse with eating and straining. Improved with not eating. He thought maybe he was constipated and tried laxative without improvement. He is able to have bowel function. No fever, chills, cough, congestion, nausea, emesis, or urinary changes. No history of similar pain in he past. He does have a abdominal surgical history of remote splenectomy ~30 years ago and incisional hernia repair with Dr Jamal Collin. Work up in the ED was concerning for signfiicant leukocytosis to 28K and sigmoid diverticulitis with anterior pelvic abscess.   Hospital Course: He was admitted to general surgery and conservative management was attempted. However he continued to have rising leukocytosis low grade tachycardia which was concerning for lack of clinical improvement. The decision was made to undergo emergent intervention. Informed consent was obtained and documented, and patient underwent uneventful Hartman's Procedure (Dr Hampton Abbot, 12/27/2018).  Post-operatively, patient had issues with post-surgical ileus which was expected given intra-operative bowel dilation. He did require TPN. On POD10 his NGT was removed and he was started on CLD. This was slowly advanced and on POD13 he was tolerating soft diet, off TPN, and having  ostomy function. He did have a repeat CT scan on 11/05 which was unremarkable. The remainder of patient's hospital course was essentially unremarkable, and discharge planning was initiated accordingly with patient safely able to be discharged home with appropriate discharge instructions, antibiotics (NONE - he complete 14 days IV Zosyn), pain control, and outpatient follow-up after all of his questions were answered to his expressed satisfaction.   Discharge Condition: Good   Physical Examination:  Constitutional: alert, cooperative and no distress Respiratory: breathing non-labored at rest  Cardiovascular: regular rate and sinus rhythm  Gastrointestinal: soft,diffuse soreness, and non-distended, JP in RLQ with serosanguinous output. Colostomy in LLQ, pink, patent, gas and bowel sweat in bag Integumentary:Wound vac to midline wound, good seal, serosanguinous    Allergies as of 01/10/2019   No Active Allergies     Medication List    TAKE these medications   ibuprofen 800 MG tablet Commonly known as: ADVIL Take 1 tablet (800 mg total) by mouth every 8 (eight) hours as needed. What changed:   medication strength  when to take this  reasons to take this   levothyroxine 175 MCG tablet Commonly known as: SYNTHROID Take 175 mcg by mouth daily.   lisinopril 20 MG tablet Commonly known as: ZESTRIL Take 20 mg by mouth daily.   oxyCODONE 5 MG immediate release tablet Commonly known as: Oxy IR/ROXICODONE Take 1 tablet (5 mg total) by mouth every 6 (six) hours as needed for severe pain or breakthrough pain.   tamsulosin 0.4 MG Caps capsule Commonly known as: FLOMAX Take 0.4 mg by mouth daily.        Follow-up Information    Piscoya, Jacqulyn Bath, MD. Schedule an appointment as soon as possible for a visit in 2 week(s).   Specialty: General  Surgery Why: s/p hartman's procedure, has wound vac - schedule appointment on either MW or F Contact information: [address] [place] [address] [phone]            Time spent on discharge management including discussion of hospital course, clinical condition, outpatient instructions, prescriptions, and follow up with the patient and members of the medical team: >30 minutes  -- Edison Simon , PA-C [facility]  01/10/2019, 1:54 PM [phone] M-F: 7am - 4pm

## 2019-01-10 NOTE — Progress Notes (Signed)
Brandon Mullen to be D/C'd home per MD order.  Discussed prescriptions and follow up appointments with the patient. Prescriptions given to patient, medication list explained in detail. Pt verbalized understanding.  Allergies as of 01/10/2019   No Active Allergies     Medication List    TAKE these medications   ibuprofen 800 MG tablet Commonly known as: ADVIL Take 1 tablet (800 mg total) by mouth every 8 (eight) hours as needed. What changed:   medication strength  when to take this  reasons to take this   levothyroxine 175 MCG tablet Commonly known as: SYNTHROID Take 175 mcg by mouth daily.   lisinopril 20 MG tablet Commonly known as: ZESTRIL Take 20 mg by mouth daily.   oxyCODONE 5 MG immediate release tablet Commonly known as: Oxy IR/ROXICODONE Take 1 tablet (5 mg total) by mouth every 6 (six) hours as needed for severe pain or breakthrough pain. Notes to patient: Last dose given today at 9:40am   tamsulosin 0.4 MG Caps capsule Commonly known as: FLOMAX Take 0.4 mg by mouth daily.       Vitals:   01/10/19 0555 01/10/19 1359  BP: 116/81 108/80  Pulse: 83 96  Resp: 20   Temp: 98.9 F (37.2 C) 98.9 F (37.2 C)  SpO2: 96% 100%    Skin clean, dry and intact without evidence of skin break down, no evidence of skin tears noted. IV catheter discontinued intact. Site without signs and symptoms of complications. Dressing and pressure applied. Pt denies pain at this time. No complaints noted.  Patient properly demonstrated teach back on wound care and ostomy management. Some take home supplies given. PICC line removed.  An After Visit Summary was printed and given to the patient. Patient escorted via [place], and D/C home via private auto.  Kerrianne Jeng A Valeta Paz

## 2019-01-10 NOTE — TOC Transition Note (Signed)
Transition of Care [facility]) - CM/SW Discharge Note   Patient Details  Name: Brandon Mullen MRN: [id number] Date of Birth: 1976-02-06  Transition of Care [facility]) CM/SW Contact:  Beverly Sessions, RN Phone Number: 01/10/2019, 4:07 PM   Clinical Narrative:     Patient discharged home today Patient was converted to wet to dry dressing from wound vac.  Patient's copay for vac was $667.  Discuessed with patient and PA, and decision was made to patient to discharge with wet to dry dressings  Bedside RN to send patient home with ostomy supplies PA has educated patient on dressing changes  Malachy Mood with [facility] notified of discharge.   Final next level of care: [place] Barriers to Discharge: Barriers Resolved   Patient Goals and CMS Choice Patient states their goals for this hospitalization and ongoing recovery are:: return home CMS Medicare.gov Compare Post Acute Care list provided to:: Patient Choice offered to / list presented to : Patient  Discharge Placement                       Discharge Plan and Services     Post Acute Care Choice: Home Health, Durable Medical Equipment          DME Arranged: Negative pressure wound device DME Agency: [employer] Date DME Agency Contacted: 01/02/19   Representative spoke with at DME Agency: [place]: RN [place] Agency: [place] Date [place]: 01/10/19   Representative spoke with at [place]: [place]) Interventions     Readmission Risk Interventions No flowsheet data found.

## 2019-01-10 NOTE — TOC Progression Note (Signed)
Transition of Care [facility]) - Progression Note    Patient Details  Name: Brandon Mullen MRN: [id number] Date of Birth: 09-11-1975  [facility]) CM/SW Contact  Beverly Sessions, RN Phone Number: 01/10/2019, 10:12 AM  Clinical Narrative:    Leroy Sea with [facility] notified need for vac to be delivered today for anticipated discharge.  States estimated delivery [phone]   Expected Discharge Plan: [place] Barriers to Discharge: Continued Medical Work up  Expected Discharge Plan and Services Expected Discharge Plan: [place] Choice: Home Health, Durable Medical Equipment Living arrangements for the past 2 months: Single Family Home                 DME Arranged: Negative pressure wound device DME Agency: [employer] Date DME Agency Contacted: 01/02/19   Representative spoke with at DME Agency: Leroy Sea HH Arranged: RN [place] Agency: [place] Date [place]: 01/04/19   Representative spoke with at [place]: [place] ([place]) Interventions    Readmission Risk Interventions No flowsheet data found.

## 2019-01-11 ENCOUNTER — Telehealth: Payer: Self-pay | Admitting: Surgery

## 2019-01-11 NOTE — Telephone Encounter (Signed)
Spoke with sharon nurse at home health.  Clarified wet to dry dressing changes twice daily. Stated he had some bleeding at the distal end of incision.   She stated the patient originally was ordered wound vac however turned down due to cost.  Instructed to monitor the incision and if bleeding worsens to call office.

## 2019-01-11 NOTE — Telephone Encounter (Signed)
Ivin Booty with [facility] is calling regarding the patient, and is needing to know about his wound care and during the dressing change there was  A lot of bleeding, she can be reached at [phone]. Please call and advise.

## 2019-01-12 ENCOUNTER — Telehealth: Payer: Self-pay | Admitting: Surgery

## 2019-01-12 ENCOUNTER — Other Ambulatory Visit: Payer: Self-pay | Admitting: Surgery

## 2019-01-12 MED ORDER — OXYCODONE HCL 5 MG PO TABS: 5.0000 mg | ORAL_TABLET | Freq: Four times a day (QID) | ORAL | 0 refills | Status: DC | PRN

## 2019-01-12 MED ORDER — CYCLOBENZAPRINE HCL 5 MG PO TABS: 5.0000 mg | ORAL_TABLET | Freq: Three times a day (TID) | ORAL | 0 refills | Status: DC | PRN

## 2019-01-12 NOTE — Telephone Encounter (Signed)
Patient is calling about the pain medication and just has some questions about the medication. Please call patient and advise.

## 2019-01-12 NOTE — Telephone Encounter (Signed)
Patient is having to take 2 Oxycodone every 6 hours as well as the Ibuprofen every 8 hours in order to control his pain so he can be mobile. He is worried about running out this weekend. I spoke with Dr Hampton Abbot and he will send in a refill as well as a prescription for Flexeril.

## 2019-01-24 ENCOUNTER — Other Ambulatory Visit: Payer: Self-pay

## 2019-01-24 ENCOUNTER — Encounter: Payer: Self-pay | Admitting: Surgery

## 2019-01-24 ENCOUNTER — Ambulatory Visit (INDEPENDENT_AMBULATORY_CARE_PROVIDER_SITE_OTHER): Payer: BC Managed Care – PPO | Admitting: Surgery

## 2019-01-24 VITALS — BP 134/87 | HR 101 | Temp 97.3°F | Ht 67.0 in | Wt 211.0 lb

## 2019-01-24 DIAGNOSIS — K5720 Diverticulitis of large intestine with perforation and abscess without bleeding: Secondary | ICD-10-CM

## 2019-01-24 MED ORDER — OXYCODONE HCL 5 MG PO TABS: 5.0000 mg | ORAL_TABLET | Freq: Four times a day (QID) | ORAL | 0 refills | Status: DC | PRN

## 2019-01-24 NOTE — Patient Instructions (Addendum)
Patient may wet the gauze(s) and pack the wound. Patient does not need to use long gauze(s).    Dr.Piscoya will refill patient prescription of Oxycodone to your local pharmacy.   Laparoscopic Colectomy, Care After This sheet gives you information about how to care for yourself after your procedure. Your health care provider may also give you more specific instructions. If you have problems or questions, contact your health care provider. What can I expect after the procedure? After your procedure, it is common to have the following:  Pain in your abdomen, especially in the incision areas. You will be given medicine to control the pain.  Tiredness. This is a normal part of the recovery process. Your energy level will return to normal over the next several weeks.  Changes in your bowel movements, such as constipation or needing to go more often. Talk with your health care provider about how to manage this. Follow these instructions at home: Medicines  Take over-the-counter and prescription medicines only as told by your health care provider.  Do not drive or use heavy machinery while taking prescription pain medicine.  Do not drink alcohol while taking prescription pain medicine.  If you were prescribed an antibiotic medicine, use it as told by your health care provider. Do not stop using the antibiotic even if you start to feel better. Incision care   Follow instructions from your health care provider about how to take care of your incision areas. Make sure you: ? Keep your incisions clean and dry. ? Wash your hands with soap and water before and after applying medicine to the areas, and before and after changing your bandage (dressing). If soap and water are not available, use hand sanitizer. ? Change your dressing as told by your health care provider. ? Leave stitches (sutures), skin glue, or adhesive strips in place. These skin closures may need to stay in place for 2  weeks or longer. If adhesive strip edges start to loosen and curl up, you may trim the loose edges. Do not remove adhesive strips completely unless your health care provider tells you to do that.  Do not wear tight clothing over the incisions. Tight clothing may rub and irritate the incision areas, which may cause the incisions to open.  Do not take baths, swim, or use a hot tub until your health care provider approves. Ask your health care provider if you can take showers. You may only be allowed to take sponge baths for bathing.  Check your incision area every day for signs of infection. Check for: ? More redness, swelling, or pain. ? More fluid or blood. ? Warmth. ? Pus or a bad smell. Activity  Avoid lifting anything that is heavier than 10 lb (4.5 kg) for 2 weeks or until your health care provider says it is okay.  You may resume normal activities as told by your health care provider. Ask your health care provider what activities are safe for you.  Take rest breaks during the day as needed. Eating and drinking  Follow instructions from your health care provider about what you can eat after surgery.  To prevent or treat constipation while you are taking prescription pain medicine, your health care provider may recommend that you: ? Drink enough fluid to keep your urine clear or pale yellow. ? Take over-the-counter or prescription medicines. ? Eat foods that are high in fiber, such as fresh fruits and vegetables, whole grains, and beans. ? Limit foods that are high in  fat and processed sugars, such as fried and sweet foods. General instructions  Ask your health care provider when you will need an appointment to get your sutures or staples removed.  Keep all follow-up visits as told by your health care provider. This is important. Contact a health care provider if:  You have more redness, swelling, or pain around your incisions.  You have more fluid or blood coming from the  incisions.  Your incisions feel warm to the touch.  You have pus or a bad smell coming from your incisions or your dressing.  You have a fever.  You have an incision that breaks open (edges not staying together) after sutures or staples have been removed. Get help right away if:  You develop a rash.  You have chest pain or difficulty breathing.  You have pain or swelling in your legs.  You feel light-headed or you faint.  Your abdomen swells (becomes distended).  You have nausea or vomiting.  You have blood in your stool (feces). This information is not intended to replace advice given to you by your health care provider. Make sure you discuss any questions you have with your health care provider. Document Released: 09/04/2004 Document Revised: 11/04/2017 Document Reviewed: 11/17/2015 Elsevier Patient Education  2020 [employer].

## 2019-01-24 NOTE — Progress Notes (Signed)
01/24/2019  HPI: Brandon Mullen is a 43 y.o. male s/p hartmann's procedure on 10/28 for perforated diverticulitis.  He presents today for follow up.  Had prolonged post-op ileus and was discharged on 11/11.  Has wet to dry dressing changes for his midline incision.  He's been doing well, but reports still having some pain issues in the left lower quadrant near the ostomy and sometimes in the right lower quadrant.  Denies any nausea, vomiting, constipation.  Vital signs: BP 134/87   Pulse (!) 101   Temp (!) 97.3 F (36.3 C) (Temporal)   Ht 5\' 7"  (1.702 m)   Wt 211 lb (95.7 kg)   SpO2 93%   BMI 33.05 kg/m    Physical Exam: Constitutional:  No acute distress Abdomen:  Soft, obese, non-distended, with some tenderness around the end colostomy.  However, there is no evidence of parastomal hernia or ischemia of the mucosa.  Mucosa is pink, viable, and with stool in bag.  Midline incision is much improved, with healthy beefy red granulation tissue.  Wound is now separated into two components above and below the umbilicus.  Wet to dry dressing changed applied.  Assessment/Plan: This is a 43 y.o. male s/p Hartmann's procedure.  --Discussed with patient that the discomfort he is having could be related to scarring and healing that's still ongoing.  There is no current evidence of any post-op complication.   --Continue wet to dry dressing change daily. --Will refill oxycodone x 1 more script. --Follow up in two weeks for wound check and to get cleared for return to work with full duties.   Melvyn Neth, [facility]

## 2019-02-07 ENCOUNTER — Other Ambulatory Visit: Payer: Self-pay

## 2019-02-07 ENCOUNTER — Ambulatory Visit (INDEPENDENT_AMBULATORY_CARE_PROVIDER_SITE_OTHER): Payer: BC Managed Care – PPO | Admitting: Physician Assistant

## 2019-02-07 ENCOUNTER — Encounter: Payer: BC Managed Care – PPO | Admitting: Surgery

## 2019-02-07 ENCOUNTER — Encounter: Payer: Self-pay | Admitting: Physician Assistant

## 2019-02-07 VITALS — BP 149/80 | HR 91 | Temp 97.9°F | Ht 67.0 in | Wt 214.6 lb

## 2019-02-07 DIAGNOSIS — K5720 Diverticulitis of large intestine with perforation and abscess without bleeding: Secondary | ICD-10-CM

## 2019-02-07 DIAGNOSIS — Z09 Encounter for follow-up examination after completed treatment for conditions other than malignant neoplasm: Secondary | ICD-10-CM

## 2019-02-07 NOTE — Progress Notes (Signed)
[facility] POST-OP OFFICE VISIT  02/07/2019  HPI: Brandon Mullen is a 43 y.o. male ~6 weeks s/p Hartman's for perforated diverticulitis with Dr Hampton Abbot.   Overall doing well 5/10 lower abdominal pain, worse after lots of activity, some relief with ibuprofen Colostomy functioning well without issues No fever, chills, nausea, or emesis Mobilizing well, has not returned to work  Vital signs: BP (!) 149/80   Pulse 91   Temp 97.9 F (36.6 C) (Temporal)   Ht 5\' 7"  (1.702 m)   Wt 214 lb 9.6 oz (97.3 kg)   SpO2 96%   BMI 33.61 kg/m    Physical Exam: Constitutional: Well appearing male, NAD Abdomen: Soft, non-tender, non-distended, no rebound/guarding. Colostomy in LLQ which is pink and patent Skin: Midline laparotomy incision healing via secondary intention, this is aprox. 80% closes, beefy granulation tissue in wound bed, no erythema, no drainage  Assessment/Plan: This is a 43 y.o. male ~6 weeks s/p Hartman's for perforated diverticulitis   - Pain control prn; continue Tylenol, Ibuprofen for now. Suspect this is expected post-surgical/healing pains  - Continue dressing changes daily + prn; no longer requiring wound packing  - Okay to shower  - Gradually increase activity  - Discussed return to work; he is not comfortable yet. I think this is reasonable as his wound has not closed and he is still having increased discomfort with activity  - He will rtc in 2 weeks for wound check and return to work discussion  -- Edison Simon, PA-C [facility] 02/07/2019, 10:19 AM [phone] M-F: 7am - 4pm

## 2019-02-07 NOTE — Patient Instructions (Addendum)
Patient will refrain from work the remainder of the year. Patient will continue with the dry gauze dressing change. Patient will follow up with Otho Ket, PA in two weeks. Patient may continue to gradually add more activity to his routine. Patient may continue with Tylenol or Motrin. If you have any questions or concerns, please feel free to contact our office.  Laparoscopic Colectomy, Care After This sheet gives you information about how to care for yourself after your procedure. Your health care provider may also give you more specific instructions. If you have problems or questions, contact your health care provider. What can I expect after the procedure? After your procedure, it is common to have the following:  Pain in your abdomen, especially in the incision areas. You will be given medicine to control the pain.  Tiredness. This is a normal part of the recovery process. Your energy level will return to normal over the next several weeks.  Changes in your bowel movements, such as constipation or needing to go more often. Talk with your health care provider about how to manage this. Follow these instructions at home: Medicines  Take over-the-counter and prescription medicines only as told by your health care provider.  Do not drive or use heavy machinery while taking prescription pain medicine.  Do not drink alcohol while taking prescription pain medicine.  If you were prescribed an antibiotic medicine, use it as told by your health care provider. Do not stop using the antibiotic even if you start to feel better. Incision care   Follow instructions from your health care provider about how to take care of your incision areas. Make sure you: ? Keep your incisions clean and dry. ? Wash your hands with soap and water before and after applying medicine to the areas, and before and after changing your bandage (dressing). If soap and water are not available, use hand sanitizer. ? Change  your dressing as told by your health care provider. ? Leave stitches (sutures), skin glue, or adhesive strips in place. These skin closures may need to stay in place for 2 weeks or longer. If adhesive strip edges start to loosen and curl up, you may trim the loose edges. Do not remove adhesive strips completely unless your health care provider tells you to do that.  Do not wear tight clothing over the incisions. Tight clothing may rub and irritate the incision areas, which may cause the incisions to open.  Do not take baths, swim, or use a hot tub until your health care provider approves. Ask your health care provider if you can take showers. You may only be allowed to take sponge baths for bathing.  Check your incision area every day for signs of infection. Check for: ? More redness, swelling, or pain. ? More fluid or blood. ? Warmth. ? Pus or a bad smell. Activity  Avoid lifting anything that is heavier than 10 lb (4.5 kg) for 2 weeks or until your health care provider says it is okay.  You may resume normal activities as told by your health care provider. Ask your health care provider what activities are safe for you.  Take rest breaks during the day as needed. Eating and drinking  Follow instructions from your health care provider about what you can eat after surgery.  To prevent or treat constipation while you are taking prescription pain medicine, your health care provider may recommend that you: ? Drink enough fluid to keep your urine clear or pale yellow. ?  Take over-the-counter or prescription medicines. ? Eat foods that are high in fiber, such as fresh fruits and vegetables, whole grains, and beans. ? Limit foods that are high in fat and processed sugars, such as fried and sweet foods. General instructions  Ask your health care provider when you will need an appointment to get your sutures or staples removed.  Keep all follow-up visits as told by your health care provider.  This is important. Contact a health care provider if:  You have more redness, swelling, or pain around your incisions.  You have more fluid or blood coming from the incisions.  Your incisions feel warm to the touch.  You have pus or a bad smell coming from your incisions or your dressing.  You have a fever.  You have an incision that breaks open (edges not staying together) after sutures or staples have been removed. Get help right away if:  You develop a rash.  You have chest pain or difficulty breathing.  You have pain or swelling in your legs.  You feel light-headed or you faint.  Your abdomen swells (becomes distended).  You have nausea or vomiting.  You have blood in your stool (feces). This information is not intended to replace advice given to you by your health care provider. Make sure you discuss any questions you have with your health care provider. Document Released: 09/04/2004 Document Revised: 11/04/2017 Document Reviewed: 11/17/2015 Elsevier Patient Education  2020 [employer].

## 2019-02-12 ENCOUNTER — Telehealth: Payer: Self-pay | Admitting: *Deleted

## 2019-02-12 NOTE — Telephone Encounter (Signed)
Faxed FMLA to [phone]

## 2019-02-19 ENCOUNTER — Ambulatory Visit (INDEPENDENT_AMBULATORY_CARE_PROVIDER_SITE_OTHER): Payer: BC Managed Care – PPO | Admitting: Physician Assistant

## 2019-02-19 ENCOUNTER — Encounter: Payer: Self-pay | Admitting: Physician Assistant

## 2019-02-19 ENCOUNTER — Other Ambulatory Visit: Payer: Self-pay

## 2019-02-19 VITALS — BP 130/90 | HR 99 | Temp 97.3°F | Ht 67.0 in | Wt 219.0 lb

## 2019-02-19 DIAGNOSIS — K5720 Diverticulitis of large intestine with perforation and abscess without bleeding: Secondary | ICD-10-CM

## 2019-02-19 DIAGNOSIS — Z09 Encounter for follow-up examination after completed treatment for conditions other than malignant neoplasm: Secondary | ICD-10-CM

## 2019-02-19 NOTE — Patient Instructions (Addendum)
Patient was recommended to give our office a call if he notices any changes such as pain or discomfort.   Referral was sent to GI to have a colonoscopy within the next three months and then afterwards patient will follow up with Dr.Piscoya.

## 2019-02-19 NOTE — Progress Notes (Signed)
[facility] POST-OP OFFICE VISIT  02/19/2019  HPI: Brandon Mullen is a 43 y.o. male ~ 2 months s/p Hartman's for perforated diverticulitis with Dr Hampton Abbot  Vital signs: Temp (!) 97.3 F (36.3 C) (Temporal)   Ht 5\' 7"  (1.702 m)   Wt 219 lb (99.3 kg)   SpO2 96%   BMI 34.30 kg/m    Physical Exam: Constitutional: Well appearing male, NAD Abdomen: Soft, non-tender, non-distended, no rebound/guarding. Colostomy in LLQ which is pink and patent Skin: Midline laparotomy incision healing via secondary intention, this is aprox. 95% closed, beefy granulation tissue in wound bed, no erythema, no drainage  Assessment/Plan: This is a 43 y.o. male ~ 2 months s/p Hartman's for perforated diverticulitis   - Pain control prn; continue Tylenol, Ibuprofen   - Continue dressing changes daily + prn; no longer requiring wound packing             - Return to work on 01/0  - refer to GI for screening colonoscopy in ~3 months prior to discussion of colostomy takedown  - rtc in 3-4 months following colonoscopy to see Dr Hampton Abbot to discuss colostomy takedown  -- Edison Simon, PA-C [facility] 02/19/2019, 11:24 AM [phone] M-F: 7am - 4pm

## 2019-03-07 ENCOUNTER — Other Ambulatory Visit: Payer: Self-pay

## 2019-03-07 ENCOUNTER — Ambulatory Visit (INDEPENDENT_AMBULATORY_CARE_PROVIDER_SITE_OTHER): Payer: Self-pay | Admitting: Surgery

## 2019-03-07 ENCOUNTER — Telehealth: Payer: Self-pay

## 2019-03-07 ENCOUNTER — Encounter: Payer: Self-pay | Admitting: Surgery

## 2019-03-07 VITALS — BP 152/91 | HR 91 | Temp 95.7°F | Ht 68.0 in | Wt 224.4 lb

## 2019-03-07 DIAGNOSIS — K435 Parastomal hernia without obstruction or  gangrene: Secondary | ICD-10-CM

## 2019-03-07 NOTE — Progress Notes (Signed)
03/07/2019  HPI: Brandon Mullen is a 44 y.o. male s/p Hartmann's for perforated diverticulitis with purulent peritonitis on 10/28.  He was last seen on 12/21 at which point his midline incision was healing well and was almost healed.  Initial plan was to wait until about March for further follow up to schedule reversal about 6 months out from his surgery.  This would allow for all the inflammation and adhesions to calm down.  He presents today though, because yesterday he had an episode of significant pain around his ostomy.  He started going back to work on 1/4, but he has not been doing anything particularly strenuous.  He noticed with the pain that there was a lot of bulging and tightness around the ostomy, and then he noticed some blood coming from the stoma itself.  Later on, the pain and swelling subsided but he called to set up an appointment to be seen.  He reports he has had some issues with harder stool and constipation, but otherwise has not had other issues.  Vital signs: BP (!) 152/91   Pulse 91   Temp (!) 95.7 F (35.4 C) (Temporal)   Ht 5\' 8"  (1.727 m)   Wt 101.8 kg   SpO2 93%   BMI 34.12 kg/m    Physical Exam: Constitutional: No acute distress Abdomen:  Soft, non-distended, currently with some soreness to palpation around the ostomy.  The midline incision is healing well and there are only two small areas of superficial shallow wound exposed.  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  Once he relaxes, the hernia contents reduce and the bulging subsides.  The mucosa itself is currently pink, viable, without any congestion.  There is no blood noted.  Assessment/Plan: This is a 44 y.o. male s/p Hartmann's procedure, with a parastomal hernia.  Discussed with the patient that he has a parastomal hernia.  The hernia is overall reducible, but clearly the patient had pain at the initial herniation.  Currently  there is some soreness but no evidence of incarceration or strangulation.  Discussed with the patient that currently the hospital has a restriction on elective cases that would require admission, and in his case, I think if we're planning on repairing a parastomal hernia on an elective basis, we should instead try to reverse his Hartmann's which would require admission.  His appointment with GI is not until the end of February, so we'll contact their office to move the appointment to the end of January.  Hopefully if the COVID restrictions are lifted, we can see him in early February and schedule his surgery after the colonoscopy is done.  In the meantime, suggested to him that he can wear an abdominal binder to see if it can help with better support around his ostomy.  Discussed with him that if his symptoms continue to progress or worsen, then we may have to schedule him sooner despite of the restrictions.  Also discussed with him the symptoms of a strangulated hernia and that if that's the case, he needs to come to hospital right away.  In that scenario, we'd only be revising the ostomy rather than reversing it.  He understands all this and all of his questions have been answered.   Melvyn Neth, [facility]

## 2019-03-07 NOTE — Patient Instructions (Signed)
Perineal Hernia What are perineal hernia symptoms in humans? Hernias are generally accompanied by a tender lump, pain, and discomfort at the site of the protrusion. The symptoms of a perineal hernia in humans have this swelling. This can be experienced by men and women.  Dr.Piscoya advised patient to try over the counter Miralax to help with discomfort of constipation. If patient noticed bulging in the area. Patient advised to lie down to help with the relaxation the belly and gravity can help with pulling things back in. Also, advised to massage and apply a bit of pressure to the area to help with the bulging. Patient also informed that a Coralee Pesa may help with comforting and helping with the bulging of the area.   If you have any questions or concerns, please do not hesitate to give our office a call.

## 2019-03-07 NOTE — Telephone Encounter (Signed)
Per Dr.Piscoya advised clinical staff to contact [facility] to get patient rescheduled for his appointment on 04/26/19 to a sooner appointment. Spoke with [facility] and got patient scheduled for an appointment tomorrow 03/08/19 at 1:30p. Left detailed message for patient and also informed him to give our office a call if he has any questions or concerns.

## 2019-03-08 ENCOUNTER — Other Ambulatory Visit: Payer: Self-pay

## 2019-03-08 ENCOUNTER — Ambulatory Visit: Payer: BC Managed Care – PPO | Admitting: Gastroenterology

## 2019-03-08 ENCOUNTER — Encounter: Payer: Self-pay | Admitting: Gastroenterology

## 2019-03-08 VITALS — BP 126/66 | HR 85 | Temp 97.9°F | Ht 67.0 in | Wt 222.4 lb

## 2019-03-08 DIAGNOSIS — K5720 Diverticulitis of large intestine with perforation and abscess without bleeding: Secondary | ICD-10-CM
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
## 2019-03-16 ENCOUNTER — Other Ambulatory Visit: Payer: BC Managed Care – PPO | Attending: Gastroenterology

## 2019-03-19 ENCOUNTER — Telehealth: Payer: Self-pay

## 2019-03-19 NOTE — Telephone Encounter (Signed)
Called pt to inform him we will need to reschedule his colonoscopy procedure to a different date as 04-02-19 is no longer available.  Unable to contact, left detailed VM to return call.

## 2019-03-20 ENCOUNTER — Other Ambulatory Visit: Payer: Self-pay

## 2019-03-20 ENCOUNTER — Ambulatory Visit
Admission: RE | Admit: 2019-03-20 | Payer: BC Managed Care – PPO | Source: Home / Self Care | Admitting: Gastroenterology

## 2019-03-20 ENCOUNTER — Encounter: Admission: RE | Payer: Self-pay | Source: Home / Self Care

## 2019-03-20 DIAGNOSIS — K5720 Diverticulitis of large intestine with perforation and abscess without bleeding: Secondary | ICD-10-CM

## 2019-03-20 SURGERY — COLONOSCOPY WITH PROPOFOL
Anesthesia: General

## 2019-03-20 NOTE — Telephone Encounter (Signed)
Spoke with pt and was able to reschedule colonoscopy to 04-10-19. Pt is aware procedure will be performed at [facility]. Pt is also aware he'll need to go for COVID test on 04-06-19.

## 2019-03-30 ENCOUNTER — Encounter: Payer: Self-pay | Admitting: Gastroenterology

## 2019-04-02 ENCOUNTER — Other Ambulatory Visit: Payer: Self-pay

## 2019-04-02 MED ORDER — PEG 3350-KCL-NABCB-NACL-NASULF 236 G PO SOLR: 4000.0000 mL | Freq: Once | ORAL | 0 refills | Status: AC

## 2019-04-06 ENCOUNTER — Other Ambulatory Visit
Admission: RE | Admit: 2019-04-06 | Discharge: 2019-04-06 | Disposition: A | Discharge status: Home / Self Care | Payer: BC Managed Care – PPO | Source: Ambulatory Visit | Attending: Gastroenterology | Admitting: Gastroenterology

## 2019-04-06 DIAGNOSIS — Z01812 Encounter for preprocedural laboratory examination: Secondary | ICD-10-CM | POA: Diagnosis not present

## 2019-04-06 DIAGNOSIS — Z20822 Contact with and (suspected) exposure to covid-19: Secondary | ICD-10-CM | POA: Insufficient documentation

## 2019-04-06 LAB — SARS CORONAVIRUS 2 (TAT 6-24 HRS): SARS Coronavirus 2: NEGATIVE

## 2019-04-09 NOTE — Discharge Instructions (Signed)
General Anesthesia, Adult, Care After This sheet gives you information about how to care for yourself after your procedure. Your health care provider may also give you more specific instructions. If you have problems or questions, contact your health care provider. What can I expect after the procedure? After the procedure, the following side effects are common:  Pain or discomfort at the IV site.  Nausea.  Vomiting.  Sore throat.  Trouble concentrating.  Feeling cold or chills.  Weak or tired.  Sleepiness and fatigue.  Soreness and body aches. These side effects can affect parts of the body that were not involved in surgery. Follow these instructions at home:  For at least 24 hours after the procedure:  Have a responsible adult stay with you. It is important to have someone help care for you until you are awake and alert.  Rest as needed.  Do not: ? Participate in activities in which you could fall or become injured. ? Drive. ? Use heavy machinery. ? Drink alcohol. ? Take sleeping pills or medicines that cause drowsiness. ? Make important decisions or sign legal documents. ? Take care of children on your own. Eating and drinking  Follow any instructions from your health care provider about eating or drinking restrictions.  When you feel hungry, start by eating small amounts of foods that are soft and easy to digest (bland), such as toast. Gradually return to your regular diet.  Drink enough fluid to keep your urine pale yellow.  If you vomit, rehydrate by drinking water, juice, or clear broth. General instructions  If you have sleep apnea, surgery and certain medicines can increase your risk for breathing problems. Follow instructions from your health care provider about wearing your sleep device: ? Anytime you are sleeping, including during daytime naps. ? While taking prescription pain medicines, sleeping medicines, or medicines that make you drowsy.  Return to  your normal activities as told by your health care provider. Ask your health care provider what activities are safe for you.  Take over-the-counter and prescription medicines only as told by your health care provider.  If you smoke, do not smoke without supervision.  Keep all follow-up visits as told by your health care provider. This is important. Contact a health care provider if:  You have nausea or vomiting that does not get better with medicine.  You cannot eat or drink without vomiting.  You have pain that does not get better with medicine.  You are unable to pass urine.  You develop a skin rash.  You have a fever.  You have redness around your IV site that gets worse. Get help right away if:  You have difficulty breathing.  You have chest pain.  You have blood in your urine or stool, or you vomit blood. Summary  After the procedure, it is common to have a sore throat or nausea. It is also common to feel tired.  Have a responsible adult stay with you for the first 24 hours after general anesthesia. It is important to have someone help care for you until you are awake and alert.  When you feel hungry, start by eating small amounts of foods that are soft and easy to digest (bland), such as toast. Gradually return to your regular diet.  Drink enough fluid to keep your urine pale yellow.  Return to your normal activities as told by your health care provider. Ask your health care provider what activities are safe for you. This information is not   intended to replace advice given to you by your health care provider. Make sure you discuss any questions you have with your health care provider. Document Revised: 02/18/2017 Document Reviewed: 10/01/2016 Elsevier Patient Education  2020 Elsevier Inc.  

## 2019-04-10 ENCOUNTER — Encounter: Payer: Self-pay | Admitting: Gastroenterology

## 2019-04-10 ENCOUNTER — Encounter
Admission: RE | Disposition: A | Discharge status: Home / Self Care | Payer: Self-pay | Source: Home / Self Care | Attending: Gastroenterology

## 2019-04-10 ENCOUNTER — Ambulatory Visit: Payer: Self-pay | Admitting: Anesthesiology

## 2019-04-10 ENCOUNTER — Other Ambulatory Visit: Payer: Self-pay

## 2019-04-10 ENCOUNTER — Encounter: Payer: Self-pay | Admitting: Anesthesiology

## 2019-04-10 ENCOUNTER — Ambulatory Visit
Admission: RE | Admit: 2019-04-10 | Discharge: 2019-04-10 | Disposition: A | Discharge status: Home / Self Care | Payer: BC Managed Care – PPO | Attending: Gastroenterology | Admitting: Gastroenterology

## 2019-04-10 DIAGNOSIS — Z87891 Personal history of nicotine dependence: Secondary | ICD-10-CM | POA: Diagnosis not present

## 2019-04-10 DIAGNOSIS — Z9049 Acquired absence of other specified parts of digestive tract: Secondary | ICD-10-CM | POA: Diagnosis not present

## 2019-04-10 DIAGNOSIS — E039 Hypothyroidism, unspecified: Secondary | ICD-10-CM | POA: Insufficient documentation

## 2019-04-10 DIAGNOSIS — K5792 Diverticulitis of intestine, part unspecified, without perforation or abscess without bleeding: Secondary | ICD-10-CM | POA: Diagnosis present

## 2019-04-10 DIAGNOSIS — K5720 Diverticulitis of large intestine with perforation and abscess without bleeding: Secondary | ICD-10-CM

## 2019-04-10 DIAGNOSIS — Z79890 Hormone replacement therapy (postmenopausal): Secondary | ICD-10-CM | POA: Diagnosis not present

## 2019-04-10 DIAGNOSIS — Z933 Colostomy status: Secondary | ICD-10-CM | POA: Diagnosis not present

## 2019-04-10 DIAGNOSIS — I10 Essential (primary) hypertension: Secondary | ICD-10-CM | POA: Diagnosis not present

## 2019-04-10 HISTORY — PX: COLONOSCOPY WITH PROPOFOL: SHX5780

## 2019-04-10 LAB — URINE DRUG SCREEN, QUALITATIVE (ARMC ONLY)
Amphetamines, Ur Screen: NOT DETECTED
Barbiturates, Ur Screen: NOT DETECTED
Benzodiazepine, Ur Scrn: NOT DETECTED
Cannabinoid 50 Ng, Ur ~~LOC~~: NOT DETECTED
Cocaine Metabolite,Ur ~~LOC~~: NOT DETECTED
MDMA (Ecstasy)Ur Screen: NOT DETECTED
Methadone Scn, Ur: NOT DETECTED
Opiate, Ur Screen: NOT DETECTED
Phencyclidine (PCP) Ur S: NOT DETECTED
Tricyclic, Ur Screen: NOT DETECTED

## 2019-04-10 SURGERY — COLONOSCOPY WITH PROPOFOL
Anesthesia: General

## 2019-04-10 MED ORDER — SODIUM CHLORIDE 0.9 % IV SOLN: INTRAVENOUS | Status: DC

## 2019-04-10 MED ORDER — PROPOFOL 500 MG/50ML IV EMUL
INTRAVENOUS | Status: DC | PRN
  Administered 2019-04-10: 175 ug/kg/min via INTRAVENOUS

## 2019-04-10 MED ORDER — LIDOCAINE HCL (CARDIAC) PF 100 MG/5ML IV SOSY
PREFILLED_SYRINGE | INTRAVENOUS | Status: DC | PRN
  Administered 2019-04-10: 100 mg via INTRATRACHEAL

## 2019-04-10 MED ORDER — PROPOFOL 10 MG/ML IV BOLUS
INTRAVENOUS | Status: DC | PRN
  Administered 2019-04-10: 80 mg via INTRAVENOUS
  Administered 2019-04-10 (×2): 20 mg via INTRAVENOUS

## 2019-04-10 NOTE — Anesthesia Preprocedure Evaluation (Signed)
Anesthesia Evaluation  Patient identified by MRN, date of birth, ID band Patient awake    Reviewed: Allergy & Precautions, NPO status , Patient's Chart, lab work & pertinent test results  History of Anesthesia Complications Negative for: history of anesthetic complications  Airway Mallampati: II  TM Distance: >3 FB Neck ROM: Full    Dental  (+) Poor Dentition   Pulmonary neg sleep apnea, neg COPD, former smoker,    breath sounds clear to auscultation- rhonchi (-) wheezing      Cardiovascular hypertension, Pt. on medications (-) CAD, (-) Past MI, (-) Cardiac Stents and (-) CABG  Rhythm:Regular Rate:Normal - Systolic murmurs and - Diastolic murmurs    Neuro/Psych neg Seizures PSYCHIATRIC DISORDERS Anxiety Depression    GI/Hepatic Neg liver ROS, GERD  ,  Endo/Other  neg diabetesHypothyroidism   Renal/GU negative Renal ROS     Musculoskeletal  (+) Arthritis ,   Abdominal (+) + obese,   Peds  Hematology negative hematology ROS (+)   Anesthesia Other Findings Past Medical History: No date: Alcohol abuse No date: Arthritis     Comment:  BIL ELBOWS No date: GERD (gastroesophageal reflux disease)     Comment:  TUMS PRN No date: Hernia, umbilical No date: Hypertension No date: Hypothyroidism   Reproductive/Obstetrics                             Anesthesia Physical Anesthesia Plan  ASA: II  Anesthesia Plan: General   Post-op Pain Management:    Induction: Intravenous  PONV Risk Score and Plan: 1 and Propofol infusion  Airway Management Planned: Natural Airway  Additional Equipment:   Intra-op Plan:   Post-operative Plan:   Informed Consent: I have reviewed the patients History and Physical, chart, labs and discussed the procedure including the risks, benefits and alternatives for the proposed anesthesia with the patient or authorized representative who has indicated his/her  understanding and acceptance.     Dental advisory given  Plan Discussed with: CRNA and Anesthesiologist  Anesthesia Plan Comments:         Anesthesia Quick Evaluation
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
## 2019-04-10 NOTE — Anesthesia Postprocedure Evaluation (Signed)
Anesthesia Post Note  Patient: Brandon Mullen  Procedure(s) Performed: COLONOSCOPY WITH PROPOFOL (N/A )  Patient location during evaluation: Endoscopy Anesthesia Type: General Level of consciousness: awake and alert and oriented Pain management: pain level controlled Vital Signs Assessment: post-procedure vital signs reviewed and stable Respiratory status: spontaneous breathing, nonlabored ventilation and respiratory function stable Cardiovascular status: blood pressure returned to baseline and stable Postop Assessment: no signs of nausea or vomiting Anesthetic complications: no     Last Vitals:  Vitals:   04/10/19 1159 04/10/19 1209  BP: 129/80 122/86  Pulse: 73 76  Resp: 14 19  Temp:    SpO2: 92% 95%    Last Pain:  Vitals:   04/10/19 1209  TempSrc:   PainSc: 0-No pain                 Olukemi Panchal

## 2019-04-10 NOTE — Transfer of Care (Signed)
Immediate Anesthesia Transfer of Care Note  Patient: Brandon Mullen  Procedure(s) Performed: COLONOSCOPY WITH PROPOFOL (N/A )  Patient Location: Endoscopy Unit  Anesthesia Type:General  Level of Consciousness: drowsy, patient cooperative and responds to stimulation  Airway & Oxygen Therapy: Patient Spontanous Breathing and Patient connected to face mask oxygen  Post-op Assessment: Report given to RN and Post -op Vital signs reviewed and stable  Post vital signs: Reviewed and stable  Last Vitals:  Vitals Value Taken Time  BP 109/93 04/10/19 1139  Temp 36.4 C 04/10/19 1139  Pulse 93 04/10/19 1140  Resp 12 04/10/19 1140  SpO2 96 % 04/10/19 1140  Vitals shown include unvalidated device data.  Last Pain:  Vitals:   04/10/19 1139  TempSrc: Temporal  PainSc: Asleep         Complications: No apparent anesthesia complications

## 2019-04-10 NOTE — Op Note (Signed)
[facility] Gastroenterology Patient Name: Brandon Mullen Procedure Date: 04/10/2019 10:43 AM MRN: [id number] Account #: [id number] Date of Birth: 1975/03/22 Admit Type: Outpatient Age: 44 Room: [facility] ENDO ROOM 3 Gender: Male Note Status: Finalized Procedure:             Colonoscopy Indications:           Preoperative assessment, Follow-up of diverticulitis Providers:             Jonathon Bellows MD, MD Referring MD:          Laneta Simmers (Referring MD) Medicines:             Monitored Anesthesia Care Complications:         No immediate complications. Procedure:             Pre-Anesthesia Assessment:                        - Prior to the procedure, a History and Physical was                         performed, and patient medications, allergies and                         sensitivities were reviewed. The patient's tolerance                         of previous anesthesia was reviewed.                        - The risks and benefits of the procedure and the                         sedation options and risks were discussed with the                         patient. All questions were answered and informed                         consent was obtained.                        - ASA Grade Assessment: II - A patient with mild                         systemic disease.                        After obtaining informed consent, the colonoscope was                         passed under direct vision. Throughout the procedure,                         the patient's blood pressure, pulse, and oxygen                         saturations were monitored continuously. The                         Colonoscope was introduced through the descending  colostomy and advanced to the the terminal ileum. The                         Colonoscope was introduced through the anus and                         advanced to the the surgical stoma. The colonoscopy                         was  performed with ease. The patient tolerated the                         procedure well. The quality of the bowel preparation                         was excellent. Findings:      The perianal and digital rectal examinations were normal.      The entire examined colon appeared normal on direct and retroflexion       views.      There was evidence of a patent end colostomy in the descending colon.       This was characterized by healthy appearing mucosa.      The exam was otherwise without abnormality on direct and retroflexion       views. Impression:            - The entire examined colon is normal on direct and                         retroflexion views.                        - Patent end colostomy with healthy appearing mucosa                         in the descending colon.                        - The examination was otherwise normal on direct and                         retroflexion views.                        - No specimens collected. Recommendation:        - Discharge patient to home (with escort).                        - Resume previous diet.                        - Continue present medications.                        - Can proceed with reversal of colostomy Procedure Code(s):     --- Professional ---                        [phone], Colonoscopy through stoma; diagnostic,  including collection of specimen(s) by brushing or                         washing, when performed (separate procedure) Diagnosis Code(s):     --- Professional ---                        Z93.3, Colostomy status                        K57.32, Diverticulitis of large intestine without                         perforation or abscess without bleeding                        Z01.818, Encounter for other preprocedural examination CPT copyright 2019 American Medical Association. All rights reserved. The codes documented in this report are preliminary and upon coder review may  be revised to  meet current compliance requirements. Jonathon Bellows, MD Jonathon Bellows MD, MD 04/10/2019 11:39:41 AM This report has been signed electronically. Number of Addenda: 0 Note Initiated On: 04/10/2019 10:43 AM Scope Withdrawal Time: 0 hours 10 minutes 6 seconds  Total Procedure Duration: 0 hours 10 minutes 52 seconds  Estimated Blood Loss:  Estimated blood loss: none.      [facility]

## 2019-04-11 ENCOUNTER — Encounter: Payer: Self-pay | Admitting: *Deleted

## 2019-04-13 ENCOUNTER — Other Ambulatory Visit: Payer: Self-pay

## 2019-04-13 ENCOUNTER — Encounter: Payer: Self-pay | Admitting: Surgery

## 2019-04-13 ENCOUNTER — Ambulatory Visit (INDEPENDENT_AMBULATORY_CARE_PROVIDER_SITE_OTHER): Payer: BC Managed Care – PPO | Admitting: Surgery

## 2019-04-13 VITALS — BP 156/93 | HR 83 | Temp 97.9°F | Resp 12 | Ht 67.0 in | Wt 225.0 lb

## 2019-04-13 DIAGNOSIS — K435 Parastomal hernia without obstruction or  gangrene: Secondary | ICD-10-CM

## 2019-04-13 DIAGNOSIS — K5720 Diverticulitis of large intestine with perforation and abscess without bleeding: Secondary | ICD-10-CM | POA: Diagnosis not present

## 2019-04-13 DIAGNOSIS — Z933 Colostomy status: Secondary | ICD-10-CM | POA: Diagnosis not present

## 2019-04-13 MED ORDER — POLYETHYLENE GLYCOL 3350 17 GM/SCOOP PO POWD: ORAL | 0 refills | Status: DC

## 2019-04-13 MED ORDER — NEOMYCIN SULFATE 500 MG PO TABS: ORAL_TABLET | ORAL | 0 refills | Status: DC

## 2019-04-13 MED ORDER — BISACODYL 5 MG PO TBEC: DELAYED_RELEASE_TABLET | ORAL | 0 refills | Status: DC

## 2019-04-13 MED ORDER — ERYTHROMYCIN BASE 500 MG PO TABS: ORAL_TABLET | ORAL | 0 refills | Status: DC
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
## 2019-04-13 NOTE — Patient Instructions (Addendum)
Our Surgery scheduler will contact you to schedule your surgery. Please have the BLUE SHEET available when she calls you.  You will need to do a Miralax Colon Surgery Prep the day before your Surgery.  Please pick up your prescriptions at your pharmacy, as well as a 64oz Gatorade and a fleets enema.   Please call the office if you have any questions or concerns.
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
## 2019-04-16 ENCOUNTER — Telehealth: Payer: Self-pay | Admitting: Surgery

## 2019-04-16 NOTE — Telephone Encounter (Signed)
Pt has been advised of pre admission date/time, Covid Testing date and Surgery date.  Surgery Date: 05/03/19 Preadmission Testing Date: 04/30/19 (8a-1p) Covid Testing Date: 05/01/19 - patient advised to go to the [facility] ([place])  Patient has been made aware to call [phone], between 1-3:00pm the day before surgery, to find out what time to arrive.

## 2019-04-26 ENCOUNTER — Ambulatory Visit: Payer: BC Managed Care – PPO | Admitting: Gastroenterology

## 2019-04-30 ENCOUNTER — Encounter: Admission: RE | Admit: 2019-04-30 | Payer: BC Managed Care – PPO | Source: Ambulatory Visit

## 2019-05-01 ENCOUNTER — Encounter
Admission: RE | Admit: 2019-05-01 | Discharge: 2019-05-01 | Disposition: A | Discharge status: Home / Self Care | Payer: BC Managed Care – PPO | Source: Ambulatory Visit | Attending: Surgery | Admitting: Surgery

## 2019-05-01 ENCOUNTER — Other Ambulatory Visit
Admission: RE | Admit: 2019-05-01 | Discharge: 2019-05-01 | Disposition: A | Discharge status: Home / Self Care | Payer: BC Managed Care – PPO | Source: Ambulatory Visit | Attending: Surgery | Admitting: Surgery

## 2019-05-01 DIAGNOSIS — Z01812 Encounter for preprocedural laboratory examination: Secondary | ICD-10-CM | POA: Insufficient documentation

## 2019-05-01 DIAGNOSIS — Z20822 Contact with and (suspected) exposure to covid-19: Secondary | ICD-10-CM | POA: Insufficient documentation

## 2019-05-01 LAB — BASIC METABOLIC PANEL
Anion gap: 9 (ref 5–15)
BUN: 9 mg/dL (ref 6–20)
CO2: 27 mmol/L (ref 22–32)
Calcium: 9.1 mg/dL (ref 8.9–10.3)
Chloride: 100 mmol/L (ref 98–111)
Creatinine, Ser: 0.83 mg/dL (ref 0.61–1.24)
GFR calc Af Amer: >60 mL/min (ref >60)
GFR calc non Af Amer: >60 mL/min (ref >60)
Glucose, Bld: 95 mg/dL (ref 70–99)
Potassium: 4.5 mmol/L (ref 3.5–5.1)
Sodium: 136 mmol/L (ref 135–145)

## 2019-05-01 LAB — SARS CORONAVIRUS 2 (TAT 6-24 HRS): SARS Coronavirus 2: NEGATIVE

## 2019-05-01 LAB — CBC
HCT: 52.4 % — ABNORMAL HIGH (ref 39.0–52.0)
Hemoglobin: 17.5 g/dL — ABNORMAL HIGH (ref 13.0–17.0)
MCH: 29.4 pg (ref 26.0–34.0)
MCHC: 33.4 g/dL (ref 30.0–36.0)
MCV: 87.9 fL (ref 80.0–100.0)
Platelets: 395 10*3/uL (ref 150–400)
RBC: 5.96 MIL/uL — ABNORMAL HIGH (ref 4.22–5.81)
RDW: 16.3 % — ABNORMAL HIGH (ref 11.5–15.5)
WBC: 11 10*3/uL — ABNORMAL HIGH (ref 4.0–10.5)
nRBC: 0 % (ref 0.0–0.2)

## 2019-05-01 NOTE — Pre-Procedure Instructions (Signed)
CALLED PT AND INSTRUCTED PT THAT THE FLEET ENEMAS NEED TO BE GIVEN THRU HIS RECTUM, NOT THE STOMA PER CARYL-LYN AT DR [facility] OFFICE

## 2019-05-01 NOTE — Patient Instructions (Addendum)
Your procedure is scheduled on: 05-03-19 THURSDAY Report to Same Day Surgery 2nd floor medical mall [facility] Entrance-take elevator on left to 2nd floor.  Check in with surgery information desk.) To find out your arrival time please call [phone] between 1PM - 3PM on 05-02-19 [facility]  Remember: Instructions that are not followed completely may result in serious medical risk, up to and including death, or upon the discretion of your surgeon and anesthesiologist your surgery may need to be rescheduled.    _x___ 1. Do not eat food after midnight the night before your procedure. NO GUM OR CANDY AFTER MIDNIGHT. You may drink clear liquids up to 2 hours before you are scheduled to arrive at the hospital for your procedure.  Do not drink clear liquids within 2 hours of your scheduled arrival to the hospital.  Clear liquids include  --Water or Apple juice without pulp  --Gatorade  --Black Coffee or Clear Tea (No milk, no creamers, do not add anything to  the coffee or Tea   ____Ensure clear carbohydrate drink on the way to the hospital for bariatric patients  ____Ensure clear carbohydrate drink 3 hours before surgery.    __x__ 2. No Alcohol for 24 hours before or after surgery.   __x__3. No Smoking or e-cigarettes for 24 prior to surgery.  Do not use any chewable tobacco products for at least 6 hour prior to surgery   ____  4. Bring all medications with you on the day of surgery if instructed.    __x__ 5. Notify your doctor if there is any change in your medical condition     (cold, fever, infections).    x___6. On the morning of surgery brush your teeth with toothpaste and water.  You may rinse your mouth with mouth wash if you wish.  Do not swallow any toothpaste or mouthwash.   Do not wear jewelry, make-up, hairpins, clips or nail polish.  Do not wear lotions, powders, or perfumes.   Do not shave 48 hours prior to surgery. Men may shave face and neck.  Do not bring valuables to  the hospital.    [facility] is not responsible for any belongings or valuables.               Contacts, dentures or bridgework may not be worn into surgery.  Leave your suitcase in the car. After surgery it may be brought to your room.  For patients admitted to the hospital, discharge time is determined by your treatment team.  _  Patients discharged the day of surgery will not be allowed to drive home.  You will need someone to drive you home and stay with you the night of your procedure.    Please read over the following fact sheets that you were given:   [facility] Preparing for Surgery   _x___ TAKE THE FOLLOWING MEDICATION THE MORNING OF SURGERY WITH A SMALL SIP OF WATER. These include:  1. SYNTHROID (LEVOTHYROXINE)  2. FOLLOW DR PISCOYA'S BOWEL PREP INSTRUCTIONS THAT WERE GIVEN TO YOU BY HIS OFFICE  3.  4.  5.  6.  _X___Fleets enema as directed- DO FLEET ENEMA AT HOME THE NIGHT BEFORE YOUR SURGERY AND THE OTHER ENEMA NEEDS TO BE DONE THE MORNING OF SURGERY, 1 HOUR PRIOR TO ARRIVAL TIME TO HOSPITAL  _x___ Use CHG Soap or sage wipes as directed on instruction sheet   ____ Use inhalers on the day of surgery and bring to hospital day of surgery  ____ Stop Metformin and Janumet 2 days prior to surgery.    ____ Take 1/2 of usual insulin dose the night before surgery and none on the morning surgery.   ____ Follow recommendations from Cardiologist, Pulmonologist or PCP regarding stopping Aspirin, Coumadin, Plavix ,Eliquis, Effient, or Pradaxa, and Pletal.  X____Stop Anti-inflammatories such as Advil, Aleve, Ibuprofen, Motrin, Naproxen, Naprosyn, Goodies powders or aspirin products NOW-OK to take Tylenol   ____ Stop supplements until after surgery.     ____ Bring C-Pap to the hospital.

## 2019-05-02 MED ORDER — SODIUM CHLORIDE 0.9 % IV SOLN
2.0000 g | INTRAVENOUS | Status: AC
  Administered 2019-05-03 (×2): 2 g via INTRAVENOUS
  Filled 2019-05-02: qty 2

## 2019-05-03 ENCOUNTER — Inpatient Hospital Stay: Payer: BC Managed Care – PPO

## 2019-05-03 ENCOUNTER — Encounter
Admission: RE | Disposition: A | Discharge status: Home / Self Care | Payer: Self-pay | Source: Home / Self Care | Attending: Surgery

## 2019-05-03 ENCOUNTER — Encounter: Payer: Self-pay | Admitting: Surgery

## 2019-05-03 ENCOUNTER — Inpatient Hospital Stay
Admission: RE | Admit: 2019-05-03 | Discharge: 2019-05-07 | DRG: 345 | Disposition: A | Discharge status: Home / Self Care | Payer: BC Managed Care – PPO | Attending: Surgery | Admitting: Surgery

## 2019-05-03 ENCOUNTER — Other Ambulatory Visit: Payer: Self-pay

## 2019-05-03 DIAGNOSIS — K435 Parastomal hernia without obstruction or  gangrene: Secondary | ICD-10-CM

## 2019-05-03 DIAGNOSIS — E039 Hypothyroidism, unspecified: Secondary | ICD-10-CM | POA: Diagnosis present

## 2019-05-03 DIAGNOSIS — Z9049 Acquired absence of other specified parts of digestive tract: Secondary | ICD-10-CM

## 2019-05-03 DIAGNOSIS — Z433 Encounter for attention to colostomy: Secondary | ICD-10-CM | POA: Diagnosis present

## 2019-05-03 DIAGNOSIS — K219 Gastro-esophageal reflux disease without esophagitis: Secondary | ICD-10-CM | POA: Diagnosis present

## 2019-05-03 DIAGNOSIS — I10 Essential (primary) hypertension: Secondary | ICD-10-CM | POA: Diagnosis present

## 2019-05-03 DIAGNOSIS — Z933 Colostomy status: Secondary | ICD-10-CM | POA: Diagnosis not present

## 2019-05-03 DIAGNOSIS — Z79890 Hormone replacement therapy (postmenopausal): Secondary | ICD-10-CM | POA: Diagnosis not present

## 2019-05-03 DIAGNOSIS — N179 Acute kidney failure, unspecified: Secondary | ICD-10-CM | POA: Diagnosis not present

## 2019-05-03 DIAGNOSIS — Z20822 Contact with and (suspected) exposure to covid-19: Secondary | ICD-10-CM | POA: Diagnosis present

## 2019-05-03 DIAGNOSIS — Z79899 Other long term (current) drug therapy: Secondary | ICD-10-CM

## 2019-05-03 DIAGNOSIS — Z9081 Acquired absence of spleen: Secondary | ICD-10-CM | POA: Diagnosis not present

## 2019-05-03 DIAGNOSIS — Z885 Allergy status to narcotic agent status: Secondary | ICD-10-CM | POA: Diagnosis not present

## 2019-05-03 HISTORY — PX: COLOSTOMY TAKEDOWN: SHX5783

## 2019-05-03 HISTORY — PX: PARASTOMAL HERNIA REPAIR: SHX2162

## 2019-05-03 LAB — URINE DRUG SCREEN, QUALITATIVE (ARMC ONLY)
Amphetamines, Ur Screen: NOT DETECTED
Barbiturates, Ur Screen: NOT DETECTED
Benzodiazepine, Ur Scrn: POSITIVE — AB
Cannabinoid 50 Ng, Ur ~~LOC~~: NOT DETECTED
Cocaine Metabolite,Ur ~~LOC~~: NOT DETECTED
MDMA (Ecstasy)Ur Screen: NOT DETECTED
Methadone Scn, Ur: NOT DETECTED
Opiate, Ur Screen: NOT DETECTED
Phencyclidine (PCP) Ur S: NOT DETECTED
Tricyclic, Ur Screen: NOT DETECTED

## 2019-05-03 SURGERY — CLOSURE, COLOSTOMY
Anesthesia: General

## 2019-05-03 MED ORDER — PANTOPRAZOLE SODIUM 40 MG IV SOLR
40.0000 mg | Freq: Every day | INTRAVENOUS | Status: DC
  Administered 2019-05-03 – 2019-05-06 (×4): 40 mg via INTRAVENOUS
  Filled 2019-05-03 (×4): qty 40

## 2019-05-03 MED ORDER — SODIUM CHLORIDE 0.9 % IV SOLN
INTRAVENOUS | Status: DC | PRN
  Administered 2019-05-03: 70 mL

## 2019-05-03 MED ORDER — DIPHENHYDRAMINE HCL 50 MG/ML IJ SOLN
INTRAMUSCULAR | Status: DC | PRN
  Administered 2019-05-03: 12.5 mg via INTRAVENOUS

## 2019-05-03 MED ORDER — LABETALOL HCL 5 MG/ML IV SOLN
INTRAVENOUS | Status: AC
  Filled 2019-05-03: qty 4

## 2019-05-03 MED ORDER — LISINOPRIL 20 MG PO TABS
20.0000 mg | ORAL_TABLET | ORAL | Status: DC
  Administered 2019-05-04 – 2019-05-07 (×4): 20 mg via ORAL
  Filled 2019-05-03 (×4): qty 1

## 2019-05-03 MED ORDER — LABETALOL HCL 5 MG/ML IV SOLN
INTRAVENOUS | Status: DC | PRN
  Administered 2019-05-03 (×2): 5 mg via INTRAVENOUS

## 2019-05-03 MED ORDER — CHLORHEXIDINE GLUCONATE CLOTH 2 % EX PADS
6.0000 | MEDICATED_PAD | Freq: Once | CUTANEOUS | Status: AC
  Administered 2019-05-03: 10:00:00 6 via TOPICAL

## 2019-05-03 MED ORDER — HYDROMORPHONE HCL 1 MG/ML IJ SOLN
0.5000 mg | INTRAMUSCULAR | Status: DC | PRN
  Administered 2019-05-03: 0.5 mg via INTRAVENOUS
  Filled 2019-05-03: qty 0.5

## 2019-05-03 MED ORDER — ONDANSETRON HCL 4 MG/2ML IJ SOLN: 4.0000 mg | Freq: Four times a day (QID) | INTRAMUSCULAR | Status: DC | PRN

## 2019-05-03 MED ORDER — FENTANYL CITRATE (PF) 100 MCG/2ML IJ SOLN
INTRAMUSCULAR | Status: AC
  Administered 2019-05-03: 18:00:00 25 ug via INTRAVENOUS
  Filled 2019-05-03: qty 2

## 2019-05-03 MED ORDER — BUPIVACAINE LIPOSOME 1.3 % IJ SUSP
INTRAMUSCULAR | Status: AC
  Filled 2019-05-03: qty 20

## 2019-05-03 MED ORDER — MIDAZOLAM HCL 2 MG/2ML IJ SOLN
INTRAMUSCULAR | Status: AC
  Filled 2019-05-03: qty 2

## 2019-05-03 MED ORDER — ROCURONIUM BROMIDE 50 MG/5ML IV SOLN
INTRAVENOUS | Status: AC
  Filled 2019-05-03: qty 1

## 2019-05-03 MED ORDER — FENTANYL CITRATE (PF) 100 MCG/2ML IJ SOLN
25.0000 ug | INTRAMUSCULAR | Status: AC | PRN
  Administered 2019-05-03 (×6): 25 ug via INTRAVENOUS

## 2019-05-03 MED ORDER — FAMOTIDINE 20 MG PO TABS
ORAL_TABLET | ORAL | Status: AC
  Filled 2019-05-03: qty 1

## 2019-05-03 MED ORDER — FENTANYL CITRATE (PF) 100 MCG/2ML IJ SOLN
INTRAMUSCULAR | Status: AC
  Filled 2019-05-03: qty 2

## 2019-05-03 MED ORDER — LEVOTHYROXINE SODIUM 50 MCG PO TABS
175.0000 ug | ORAL_TABLET | Freq: Every day | ORAL | Status: DC
  Administered 2019-05-04 – 2019-05-07 (×4): 175 ug via ORAL
  Filled 2019-05-03 (×4): qty 1

## 2019-05-03 MED ORDER — DEXMEDETOMIDINE HCL IN NACL 80 MCG/20ML IV SOLN
INTRAVENOUS | Status: AC
  Filled 2019-05-03: qty 20

## 2019-05-03 MED ORDER — LACTATED RINGERS IV SOLN: INTRAVENOUS | Status: DC

## 2019-05-03 MED ORDER — ALVIMOPAN 12 MG PO CAPS
12.0000 mg | ORAL_CAPSULE | Freq: Two times a day (BID) | ORAL | Status: DC
  Administered 2019-05-04: 10:00:00 12 mg via ORAL
  Filled 2019-05-03 (×2): qty 1

## 2019-05-03 MED ORDER — ENOXAPARIN SODIUM 40 MG/0.4ML ~~LOC~~ SOLN: 40.0000 mg | SUBCUTANEOUS | Status: DC

## 2019-05-03 MED ORDER — KETOROLAC TROMETHAMINE 30 MG/ML IJ SOLN
30.0000 mg | Freq: Four times a day (QID) | INTRAMUSCULAR | Status: DC
  Administered 2019-05-04 (×2): 30 mg via INTRAVENOUS
  Filled 2019-05-03 (×2): qty 1

## 2019-05-03 MED ORDER — FAMOTIDINE 20 MG PO TABS
20.0000 mg | ORAL_TABLET | Freq: Once | ORAL | Status: AC
  Administered 2019-05-03: 20 mg via ORAL

## 2019-05-03 MED ORDER — GABAPENTIN 300 MG PO CAPS: 300.0000 mg | ORAL_CAPSULE | ORAL | Status: AC

## 2019-05-03 MED ORDER — DEXMEDETOMIDINE HCL 200 MCG/2ML IV SOLN
INTRAVENOUS | Status: DC | PRN
  Administered 2019-05-03 (×7): 4 ug via INTRAVENOUS

## 2019-05-03 MED ORDER — MIDAZOLAM HCL 2 MG/2ML IJ SOLN
INTRAMUSCULAR | Status: DC | PRN
  Administered 2019-05-03: 2 mg via INTRAVENOUS

## 2019-05-03 MED ORDER — BUPIVACAINE-EPINEPHRINE (PF) 0.5% -1:200000 IJ SOLN
INTRAMUSCULAR | Status: DC | PRN
  Administered 2019-05-03: 30 mL

## 2019-05-03 MED ORDER — ACETAMINOPHEN 500 MG PO TABS: 1000.0000 mg | ORAL_TABLET | ORAL | Status: AC

## 2019-05-03 MED ORDER — DEXAMETHASONE SODIUM PHOSPHATE 10 MG/ML IJ SOLN
INTRAMUSCULAR | Status: DC | PRN
  Administered 2019-05-03: 5 mg via INTRAVENOUS

## 2019-05-03 MED ORDER — SODIUM CHLORIDE 0.9 % IV SOLN
2.0000 g | Freq: Two times a day (BID) | INTRAVENOUS | Status: AC
  Administered 2019-05-04 (×2): 2 g via INTRAVENOUS
  Filled 2019-05-03 (×2): qty 2

## 2019-05-03 MED ORDER — FLEET ENEMA 7-19 GM/118ML RE ENEM
1.0000 | ENEMA | Freq: Once | RECTAL | Status: AC
  Administered 2019-05-03: 10:00:00 1 via RECTAL

## 2019-05-03 MED ORDER — LIDOCAINE HCL (CARDIAC) PF 100 MG/5ML IV SOSY
PREFILLED_SYRINGE | INTRAVENOUS | Status: DC | PRN
  Administered 2019-05-03: 50 mg via INTRAVENOUS

## 2019-05-03 MED ORDER — EPHEDRINE SULFATE 50 MG/ML IJ SOLN
INTRAMUSCULAR | Status: AC
  Filled 2019-05-03: qty 1

## 2019-05-03 MED ORDER — SUGAMMADEX SODIUM 200 MG/2ML IV SOLN
INTRAVENOUS | Status: AC
  Filled 2019-05-03: qty 2

## 2019-05-03 MED ORDER — HYDROMORPHONE HCL 1 MG/ML IJ SOLN
1.0000 mg | INTRAMUSCULAR | Status: DC | PRN
  Administered 2019-05-03 – 2019-05-04 (×3): 1 mg via INTRAVENOUS
  Filled 2019-05-03 (×3): qty 1

## 2019-05-03 MED ORDER — ONDANSETRON HCL 4 MG/2ML IJ SOLN: 4.0000 mg | Freq: Once | INTRAMUSCULAR | Status: DC | PRN

## 2019-05-03 MED ORDER — KETAMINE HCL 50 MG/ML IJ SOLN
INTRAMUSCULAR | Status: AC
  Filled 2019-05-03: qty 10

## 2019-05-03 MED ORDER — ROCURONIUM BROMIDE 100 MG/10ML IV SOLN
INTRAVENOUS | Status: DC | PRN
  Administered 2019-05-03 (×3): 10 mg via INTRAVENOUS
  Administered 2019-05-03: 20 mg via INTRAVENOUS
  Administered 2019-05-03 (×2): 10 mg via INTRAVENOUS
  Administered 2019-05-03: 50 mg via INTRAVENOUS
  Administered 2019-05-03: 30 mg via INTRAVENOUS

## 2019-05-03 MED ORDER — SUGAMMADEX SODIUM 200 MG/2ML IV SOLN
INTRAVENOUS | Status: DC | PRN
  Administered 2019-05-03: 200 mg via INTRAVENOUS

## 2019-05-03 MED ORDER — ALVIMOPAN 12 MG PO CAPS: 12.0000 mg | ORAL_CAPSULE | ORAL | Status: AC

## 2019-05-03 MED ORDER — GABAPENTIN 300 MG PO CAPS
ORAL_CAPSULE | ORAL | Status: AC
  Administered 2019-05-03: 300 mg via ORAL
  Filled 2019-05-03: qty 1

## 2019-05-03 MED ORDER — FLEET ENEMA 7-19 GM/118ML RE ENEM: 1.0000 | ENEMA | Freq: Once | RECTAL | Status: DC

## 2019-05-03 MED ORDER — ACETAMINOPHEN 500 MG PO TABS
1000.0000 mg | ORAL_TABLET | Freq: Four times a day (QID) | ORAL | Status: DC
  Administered 2019-05-04 – 2019-05-05 (×5): 1000 mg via ORAL
  Administered 2019-05-05: 18:00:00 500 mg via ORAL
  Administered 2019-05-05 – 2019-05-07 (×8): 1000 mg via ORAL
  Filled 2019-05-03 (×14): qty 2

## 2019-05-03 MED ORDER — ALVIMOPAN 12 MG PO CAPS
ORAL_CAPSULE | ORAL | Status: AC
  Administered 2019-05-03: 10:00:00 12 mg via ORAL
  Filled 2019-05-03: qty 1

## 2019-05-03 MED ORDER — ACETAMINOPHEN 500 MG PO TABS
ORAL_TABLET | ORAL | Status: AC
  Administered 2019-05-03: 10:00:00 1000 mg via ORAL
  Filled 2019-05-03: qty 2

## 2019-05-03 MED ORDER — PHENYLEPHRINE HCL (PRESSORS) 10 MG/ML IV SOLN
INTRAVENOUS | Status: DC | PRN
  Administered 2019-05-03: 100 ug via INTRAVENOUS

## 2019-05-03 MED ORDER — ONDANSETRON 4 MG PO TBDP: 4.0000 mg | ORAL_TABLET | Freq: Four times a day (QID) | ORAL | Status: DC | PRN

## 2019-05-03 MED ORDER — ACETAMINOPHEN 10 MG/ML IV SOLN
INTRAVENOUS | Status: AC
  Filled 2019-05-03: qty 100

## 2019-05-03 MED ORDER — KETAMINE HCL 50 MG/ML IJ SOLN
INTRAMUSCULAR | Status: DC | PRN
  Administered 2019-05-03 (×3): 10 mg via INTRAMUSCULAR
  Administered 2019-05-03: 5 mg via INTRAMUSCULAR
  Administered 2019-05-03: 10 mg via INTRAMUSCULAR

## 2019-05-03 MED ORDER — SEVOFLURANE IN SOLN
RESPIRATORY_TRACT | Status: AC
  Filled 2019-05-03: qty 250

## 2019-05-03 MED ORDER — FENTANYL CITRATE (PF) 100 MCG/2ML IJ SOLN
INTRAMUSCULAR | Status: DC | PRN
  Administered 2019-05-03 (×6): 50 ug via INTRAVENOUS

## 2019-05-03 MED ORDER — BUPIVACAINE LIPOSOME 1.3 % IJ SUSP: 20.0000 mL | Freq: Once | INTRAMUSCULAR | Status: DC

## 2019-05-03 MED ORDER — PROPOFOL 10 MG/ML IV BOLUS
INTRAVENOUS | Status: DC | PRN
  Administered 2019-05-03: 160 mg via INTRAVENOUS

## 2019-05-03 MED ORDER — SODIUM CHLORIDE 0.9 % IV SOLN
2.0000 g | Freq: Once | INTRAVENOUS | Status: DC
  Filled 2019-05-03: qty 2

## 2019-05-03 MED ORDER — ACETAMINOPHEN 10 MG/ML IV SOLN
INTRAVENOUS | Status: DC | PRN
  Administered 2019-05-03: 1000 mg via INTRAVENOUS

## 2019-05-03 MED ORDER — ONDANSETRON HCL 4 MG/2ML IJ SOLN
INTRAMUSCULAR | Status: DC | PRN
  Administered 2019-05-03: 4 mg via INTRAVENOUS

## 2019-05-03 MED ORDER — BUPIVACAINE-EPINEPHRINE (PF) 0.5% -1:200000 IJ SOLN
INTRAMUSCULAR | Status: AC
  Filled 2019-05-03: qty 30

## 2019-05-03 MED ORDER — DIPHENHYDRAMINE HCL 50 MG/ML IJ SOLN
INTRAMUSCULAR | Status: AC
  Filled 2019-05-03: qty 1

## 2019-05-03 MED ORDER — KETOROLAC TROMETHAMINE 30 MG/ML IJ SOLN
INTRAMUSCULAR | Status: AC
  Administered 2019-05-03: 30 mg via INTRAVENOUS
  Filled 2019-05-03: qty 1

## 2019-05-03 SURGICAL SUPPLY — 72 items
APPLIER CLIP 11 MED OPEN (CLIP)
APPLIER CLIP 13 LRG OPEN (CLIP)
BLADE CLIPPER SURG (BLADE) ×2 IMPLANT
BNDG CONFORM 2 STRL LF (GAUZE/BANDAGES/DRESSINGS) IMPLANT
BRUSH SCRUB EZ  4% CHG (MISCELLANEOUS) ×1
BRUSH SCRUB EZ 4% CHG (MISCELLANEOUS) ×1 IMPLANT
BULB RESERV EVAC DRAIN JP 100C (MISCELLANEOUS) ×2 IMPLANT
CANISTER SUCT 1200ML W/VALVE (MISCELLANEOUS) ×2 IMPLANT
CANISTER WOUND CARE 500ML ATS (WOUND CARE) ×2 IMPLANT
CATH URET ROBINSON 16FR STRL (CATHETERS) IMPLANT
CHLORAPREP W/TINT 26 (MISCELLANEOUS) ×2 IMPLANT
CLIP APPLIE 11 MED OPEN (CLIP) IMPLANT
CLIP APPLIE 13 LRG OPEN (CLIP) IMPLANT
COVER WAND RF STERILE (DRAPES) ×2 IMPLANT
DRAIN CHANNEL JP 19F (MISCELLANEOUS) ×2 IMPLANT
DRAPE LAPAROTOMY 100X77 ABD (DRAPES) ×2 IMPLANT
DRSG OPSITE POSTOP 4X6 (GAUZE/BANDAGES/DRESSINGS) IMPLANT
DRSG TELFA 3X8 NADH (GAUZE/BANDAGES/DRESSINGS) IMPLANT
DRSG TELFA 4X3 1S NADH ST (GAUZE/BANDAGES/DRESSINGS) IMPLANT
ELECT BLADE 6.5 EXT (BLADE) ×2 IMPLANT
ELECT CAUTERY BLADE 6.4 (BLADE) ×2 IMPLANT
ELECT REM PT RETURN 9FT ADLT (ELECTROSURGICAL) ×2
ELECTRODE REM PT RTRN 9FT ADLT (ELECTROSURGICAL) ×1 IMPLANT
GAUZE SPONGE 4X4 12PLY STRL (GAUZE/BANDAGES/DRESSINGS) IMPLANT
GLOVE SURG SYN 7.0 (GLOVE) ×12 IMPLANT
GLOVE SURG SYN 7.5  E (GLOVE) ×6
GLOVE SURG SYN 7.5 E (GLOVE) ×6 IMPLANT
GOWN STRL REUS W/ TWL LRG LVL3 (GOWN DISPOSABLE) ×6 IMPLANT
GOWN STRL REUS W/ TWL XL LVL3 (GOWN DISPOSABLE) ×1 IMPLANT
GOWN STRL REUS W/TWL LRG LVL3 (GOWN DISPOSABLE) ×6
GOWN STRL REUS W/TWL XL LVL3 (GOWN DISPOSABLE) ×1
HANDLE SUCTION POOLE (INSTRUMENTS) ×1 IMPLANT
HOLDER FOLEY CATH W/STRAP (MISCELLANEOUS) ×2 IMPLANT
LABEL OR SOLS (LABEL) ×2 IMPLANT
LIGASURE IMPACT 36 18CM CVD LR (INSTRUMENTS) IMPLANT
NEEDLE HYPO 22GX1.5 SAFETY (NEEDLE) ×2 IMPLANT
NS IRRIG 1000ML POUR BTL (IV SOLUTION) ×2 IMPLANT
PACK BASIN MAJOR ARMC (MISCELLANEOUS) ×2 IMPLANT
PACK COLON CLEAN CLOSURE (MISCELLANEOUS) ×2 IMPLANT
PLEDGET CV PTFE 7X3 (MISCELLANEOUS) IMPLANT
PREVENA INCISION MGT 90 150 (MISCELLANEOUS) ×2 IMPLANT
RELOAD LINEAR CUT PROX 55 BLUE (ENDOMECHANICALS) IMPLANT
RELOAD PROXIMATE 75MM BLUE (ENDOMECHANICALS) IMPLANT
SPONGE LAP 18X18 RF (DISPOSABLE) ×4 IMPLANT
STAPLER ENDO ILS CVD 18 33 (STAPLE) ×2 IMPLANT
STAPLER PROXIMATE 55 BLUE (STAPLE) IMPLANT
STAPLER PROXIMATE 75MM BLUE (STAPLE) IMPLANT
STAPLER SKIN PROX 35W (STAPLE) ×2 IMPLANT
SUCTION POOLE HANDLE (INSTRUMENTS) ×2
SUT ETHILON 3-0 FS-10 30 BLK (SUTURE) ×2
SUT PDS AB 1 CT  36 (SUTURE) ×4
SUT PDS AB 1 CT 36 (SUTURE) ×4 IMPLANT
SUT PDS AB 1 CT1 36 (SUTURE) IMPLANT
SUT PDS PLUS 0 (SUTURE)
SUT PDS PLUS AB 0 CT-2 (SUTURE) IMPLANT
SUT PROLENE 2 0 KS (SUTURE) ×2 IMPLANT
SUT PROLENE 2 0 SH DA (SUTURE) ×2 IMPLANT
SUT SILK 2 0 (SUTURE) ×1
SUT SILK 2 0 SH CR/8 (SUTURE) IMPLANT
SUT SILK 2 0SH CR/8 30 (SUTURE) IMPLANT
SUT SILK 2-0 18XBRD TIE 12 (SUTURE) ×1 IMPLANT
SUT SILK 3-0 (SUTURE) IMPLANT
SUT VIC AB 2-0 SH 27 (SUTURE) ×5
SUT VIC AB 2-0 SH 27XBRD (SUTURE) ×5 IMPLANT
SUT VIC AB 3-0 SH 27 (SUTURE)
SUT VIC AB 3-0 SH 27X BRD (SUTURE) IMPLANT
SUTURE EHLN 3-0 FS-10 30 BLK (SUTURE) ×1 IMPLANT
SYR 10ML LL (SYRINGE) IMPLANT
SYR 20ML LL LF (SYRINGE) ×4 IMPLANT
SYR BULB IRRIG 60ML STRL (SYRINGE) IMPLANT
SYRINGE IRR TOOMEY STRL 70CC (SYRINGE) IMPLANT
TRAY FOLEY MTR SLVR 16FR STAT (SET/KITS/TRAYS/PACK) ×2 IMPLANT
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
## 2019-05-03 NOTE — Interval H&P Note (Signed)
History and Physical Interval Note:  05/03/2019 10:59 AM  Brandon Mullen  has presented today for surgery, with the diagnosis of colostomy in place; parastomal hernia.  The various methods of treatment have been discussed with the patient and family. After consideration of risks, benefits and other options for treatment, the patient has consented to  Procedure(s): COLOSTOMY TAKEDOWN (N/A) HERNIA REPAIR PARASTOMAL (N/A) as a surgical intervention.  The patient's history has been reviewed, patient examined, no change in status, stable for surgery.  I have reviewed the patient's chart and labs.  Questions were answered to the patient's satisfaction.     Corry Ihnen

## 2019-05-03 NOTE — Anesthesia Postprocedure Evaluation (Signed)
Anesthesia Post Note  Patient: NOBUO ROSETE  Procedure(s) Performed: COLOSTOMY TAKEDOWN (N/A ) HERNIA REPAIR PARASTOMAL (N/A )  Patient location during evaluation: PACU Anesthesia Type: General Level of consciousness: awake and alert Pain management: pain level controlled Vital Signs Assessment: post-procedure vital signs reviewed and stable Respiratory status: spontaneous breathing, nonlabored ventilation, respiratory function stable and patient connected to nasal cannula oxygen Cardiovascular status: blood pressure returned to baseline and stable Postop Assessment: no apparent nausea or vomiting Anesthetic complications: no     Last Vitals:  Vitals:   05/03/19 1841 05/03/19 1851  BP: (!) 143/104 (!) 161/101  Pulse: 82 83  Resp: 19 17  Temp:    SpO2: 95% 96%    Last Pain:  Vitals:   05/03/19 1851  TempSrc:   PainSc: Asleep                 Precious Haws Sadrac Zeoli

## 2019-05-03 NOTE — Op Note (Addendum)
Procedure Date:  05/03/2019  Pre-operative Diagnosis:  Colostomy in place, parastomal hernia  Post-operative Diagnosis:  Colostomy in place, parastomal hernia  Procedure:   1.  Colostomy reversal with colorectal anastomosis 2.  splenic flexure mobilization 3.  repair of parastomal hernia 4.  excision of skin and subcutaneous tissue of 70 sqcm,  5.  application of wound vac < 50 cm. 6.  Rigid sigmoidoscopy  Surgeon:  Melvyn Neth, MD  Assistant:  Caroleen Hamman, MD.  His assistance was needed given the complexity of the case and to create the anastomosis.  Also assisting, Edison Simon, PA-C and Burke Keels, PA-S  Anesthesia:  General endotracheal  Estimated Blood Loss:  25 ml  Specimens: 1. End colostomy 2. Anastomotic donuts 3. Parastomal hernia sac 4. Skin and subcutaneous tissue from previous scar  Complications:  None  Indications for Procedure:  This is a 44 y.o. male with a prior Hartmann's procedure from perforated diverticulitis, and now presents for colostomy takedown and anastomosis.  Colonoscopy did not reveal any other issues that would preclude surgery. He also has a parastomal hernia that will be repaired.  The risks of bleeding, infection, bowel injury, need for diverting ostomy, and need for further procedures were all discussed with the patient and he was willing to proceed.   Description of Procedure: The patient was correctly identified in the preoperative area and brought into the operating room.  The patient was placed supine with VTE prophylaxis in place.  Appropriate time-outs were performed.  Anesthesia was induced and the patient was intubated.  Appropriate antibiotics were infused.  The patient was then placed in lithotomy position.   The end colostomy was sutured closed with a 2-0 Silk suture.  The abdomen was prepped and draped in a sterile fashion. The previous midline scar was fully excised including skin and subcutaneous tissue.  We then used  cautery to dissect down subcutaneous tissue to the fascia, and the fascia was incised and the abdominal cavity entered.  The fascial incision was extended superiorly and inferiorly without any injury to bowel.  We then proceeded with lysis of adhesions to mobilize the small bowel out of the pelvis.  The rectal stump was identified by finding the previously placed 2-0 Prolene sutures.  The rectal stump was mobilized as well and the anterior wall of the rectum was exposed.  We then proceeded to dissect free the stoma by dissecting around the mucocutaneous junction and mobilizing the ostomy from the subcutaneous tissue and the fascia.  The patient had a parastomal hernia, with small bowel protruding around the ostomy.  We then resected the distal 2 cm of the end colostomy in order to get clean edges.  We placed dilators through the open end as well and decided that a 33 size EEA stapler anvil would be appropriate.  We used the pursestring clamp in order to create a pursestring using 2-0 Prolene suture, but this did not work, so we did a manual pursestring using 2-0 Prolene.  The anvil was secured with no complications.  The anvil and end colostomy was then placed back into the abdomen.  We then proceeded with mobilizing the descending colon along the white line of toldt including the splenic flexure so the anastomosis could be done tension free.     Dilators were then used at the rectal end and the 33 size EEA stapler inserted transrectally and spike was deployed.  We were able to connect the anvil and the spike with appropriate click and  no complications with good orientation.  We then closed the stapler watching that the ends were approximating appropriately with no gaps.  The stapler was fired and removed.  The anastomotic donuts were examined and noted to be intact and full thickness.  Then a rigid sigmoidoscope was placed transrectally and after filling the pelvis with saline, the anastomosis was tested using  insufflation.  No bubbles were noted intra-abdominally revealing no leaks.   The abdomen was then thoroughly irrigated.  100 ml of Exparel solution mixed with 0.5% Bupivacaine with epi and saline was infiltrated onto the fascia and subcutaneous tissue of the midline incision and ostomy wound.  The parastomal hernia sac was excised from the ostomy wound.  A 19 Fr. Blake drain was introduced from the right lower quadrant with the tail going to the pelvis around the anastomosis.  The midline incision and the ostomy incision were closed using #1 running PDS suture.  Wound cavity was irrigated and the skin was approximated using 2-0 Vicryl, and then the skin was closed with staples.  The blake drain was secured using 3-0 Nylon suture.  A Prevena Plus wound vac was applied to the midline incision and the ostomy incision.  A 4x4 gauze was used for the drain with TegaDerm.   The patient was emerged from anesthesia and extubated and brought to the recovery room for further management.   The patient tolerated the procedure well and all counts were correct at the end of the case.    Melvyn Neth, MD

## 2019-05-03 NOTE — Progress Notes (Signed)
MEWS Guidelines - (patients age 46 and over)  Red - At High Risk for Deterioration Yellow - At risk for Deterioration  1. Go to room and assess patient 2. Validate data. Is this patient's baseline? If data confirmed: 3. Is this an acute change? 4. Administer prn meds/treatments as ordered. 5. Note Sepsis score 6. Review goals of care 7. Sports coach, RRT nurse and Provider. 8. Ask Provider to come to bedside.  9. Document patient condition/interventions/response. 10. Increase frequency of vital signs and focused assessments to at least q15 minutes x 4, then q30 minutes x2. - If stable, then q1h x3, then q4h x3 and then q8h or dept. routine. - If unstable, contact Provider & RRT nurse. Prepare for possible transfer. 11. Add entry in progress notes using the smart phrase ".MEWS". 1. Go to room and assess patient 2. Validate data. Is this patient's baseline? If data confirmed: 3. Is this an acute change? 4. Administer prn meds/treatments as ordered? 5. Note Sepsis score 6. Review goals of care 7. Sports coach and Provider 8. Call RRT nurse as needed. 9. Document patient condition/interventions/response. 10. Increase frequency of vital signs and focused assessments to at least q2h x2. - If stable, then q4h x2 and then q8h or dept. routine. - If unstable, contact Provider & RRT nurse. Prepare for possible transfer. 11. Add entry in progress notes using the smart phrase ".MEWS".  Green - Likely stable Lavender - Comfort Care Only  1. Continue routine/ordered monitoring.  2. Review goals of care. 1. Continue routine/ordered monitoring. 2. Review goals of care.  Patient was assessed MD called prn medication is ordered due to elevated pain and elevated Heart rate remaining vitals were wnl

## 2019-05-03 NOTE — Transfer of Care (Signed)
Immediate Anesthesia Transfer of Care Note  Patient: Brandon Mullen  Procedure(s) Performed: COLOSTOMY TAKEDOWN (N/A ) HERNIA REPAIR PARASTOMAL (N/A )  Patient Location: PACU  Anesthesia Type:General  Level of Consciousness: drowsy  Airway & Oxygen Therapy: Patient Spontanous Breathing and Patient connected to face mask oxygen  Post-op Assessment: Report given to RN and Post -op Vital signs reviewed and stable  Post vital signs: Reviewed and stable  Last Vitals:  Vitals Value Taken Time  BP 142/92 05/03/19 1651  Temp 36.6 C 05/03/19 1650  Pulse 92 05/03/19 1656  Resp 17 05/03/19 1656  SpO2 93 % 05/03/19 1656  Vitals shown include unvalidated device data.  Last Pain:  Vitals:   05/03/19 1002  TempSrc: Tympanic  PainSc: 0-No pain         Complications: No apparent anesthesia complications

## 2019-05-03 NOTE — Anesthesia Procedure Notes (Signed)
Procedure Name: Intubation Date/Time: 05/03/2019 11:34 AM Performed by: Allean Found, CRNA Pre-anesthesia Checklist: Patient identified, Patient being monitored, Timeout performed, Emergency Drugs available and Suction available Patient Re-evaluated:Patient Re-evaluated prior to induction Oxygen Delivery Method: Circle system utilized Preoxygenation: Pre-oxygenation with 100% oxygen Induction Type: IV induction Ventilation: Mask ventilation without difficulty Laryngoscope Size: Mac, 3 and McGraph Grade View: Grade I Tube type: Oral Tube size: 7.5 mm Number of attempts: 1 Airway Equipment and Method: Stylet and Video-laryngoscopy Placement Confirmation: ETT inserted through vocal cords under direct vision,  positive ETCO2 and breath sounds checked- equal and bilateral Secured at: 22 cm Tube secured with: Tape Dental Injury: Teeth and Oropharynx as per pre-operative assessment

## 2019-05-04 LAB — CBC WITH DIFFERENTIAL/PLATELET
Abs Immature Granulocytes: 0.13 10*3/uL — ABNORMAL HIGH (ref 0.00–0.07)
Basophils Absolute: 0 10*3/uL (ref 0.0–0.1)
Basophils Relative: 0 %
Eosinophils Absolute: 0 10*3/uL (ref 0.0–0.5)
Eosinophils Relative: 0 %
HCT: 44.3 % (ref 39.0–52.0)
Hemoglobin: 15.1 g/dL (ref 13.0–17.0)
Immature Granulocytes: 1 %
Lymphocytes Relative: 7 %
Lymphs Abs: 1.7 10*3/uL (ref 0.7–4.0)
MCH: 30.4 pg (ref 26.0–34.0)
MCHC: 34.1 g/dL (ref 30.0–36.0)
MCV: 89.3 fL (ref 80.0–100.0)
Monocytes Absolute: 2.8 10*3/uL — ABNORMAL HIGH (ref 0.1–1.0)
Monocytes Relative: 12 %
Neutro Abs: 18.2 10*3/uL — ABNORMAL HIGH (ref 1.7–7.7)
Neutrophils Relative %: 80 %
Platelets: 360 10*3/uL (ref 150–400)
RBC: 4.96 MIL/uL (ref 4.22–5.81)
RDW: 16.1 % — ABNORMAL HIGH (ref 11.5–15.5)
WBC: 22.8 10*3/uL — ABNORMAL HIGH (ref 4.0–10.5)
nRBC: 0 % (ref 0.0–0.2)

## 2019-05-04 LAB — BASIC METABOLIC PANEL
Anion gap: 11 (ref 5–15)
BUN: 20 mg/dL (ref 6–20)
CO2: 23 mmol/L (ref 22–32)
Calcium: 8 mg/dL — ABNORMAL LOW (ref 8.9–10.3)
Chloride: 104 mmol/L (ref 98–111)
Creatinine, Ser: 1.79 mg/dL — ABNORMAL HIGH (ref 0.61–1.24)
GFR calc Af Amer: 52 mL/min — ABNORMAL LOW (ref >60)
GFR calc non Af Amer: 45 mL/min — ABNORMAL LOW (ref >60)
Glucose, Bld: 129 mg/dL — ABNORMAL HIGH (ref 70–99)
Potassium: 4.6 mmol/L (ref 3.5–5.1)
Sodium: 138 mmol/L (ref 135–145)

## 2019-05-04 LAB — MAGNESIUM: Magnesium: 1.8 mg/dL (ref 1.7–2.4)

## 2019-05-04 MED ORDER — HYDROMORPHONE HCL 1 MG/ML IJ SOLN
0.5000 mg | INTRAMUSCULAR | Status: DC | PRN
  Administered 2019-05-04 – 2019-05-05 (×7): 0.5 mg via INTRAVENOUS
  Filled 2019-05-04 (×7): qty 0.5

## 2019-05-04 MED ORDER — SIMETHICONE 80 MG PO CHEW
80.0000 mg | CHEWABLE_TABLET | Freq: Four times a day (QID) | ORAL | Status: DC | PRN
  Administered 2019-05-04 – 2019-05-06 (×3): 80 mg via ORAL
  Filled 2019-05-04 (×3): qty 1

## 2019-05-04 MED ORDER — SODIUM CHLORIDE 0.9 % IV SOLN: INTRAVENOUS | Status: DC

## 2019-05-04 MED ORDER — OXYCODONE HCL 5 MG PO TABS
5.0000 mg | ORAL_TABLET | ORAL | Status: DC | PRN
  Administered 2019-05-04 – 2019-05-07 (×13): 10 mg via ORAL
  Filled 2019-05-04 (×13): qty 2

## 2019-05-04 MED ORDER — CHLORHEXIDINE GLUCONATE CLOTH 2 % EX PADS
6.0000 | MEDICATED_PAD | Freq: Every day | CUTANEOUS | Status: DC
  Administered 2019-05-04 – 2019-05-06 (×3): 6 via TOPICAL

## 2019-05-04 MED ORDER — GABAPENTIN 300 MG PO CAPS
300.0000 mg | ORAL_CAPSULE | Freq: Three times a day (TID) | ORAL | Status: DC
  Administered 2019-05-04 – 2019-05-07 (×10): 300 mg via ORAL
  Filled 2019-05-04 (×10): qty 1

## 2019-05-04 NOTE — Progress Notes (Signed)
[place] Hospital Day(s): 1.   Post op day(s): 1 Day Post-Op.   Interval History:  Patient seen and examined no acute events or new complaints overnight.  Patient reports he is doing well Abdominal pain primarily in the lower abdomen; improved with medications No distension, nausea, or emesis Bump in sCr (1.79); UO - 1.3L Leukocytosis to 22.8; afebrile; suspect reactive from [place] with 385 ccs out since surgery NPO since surgery; he has endorses flatus and multiple small Bms   Vital signs in last 24 hours: [min-max] current  Temp:  [97.4 F (36.3 C)-98.7 F (37.1 C)] 97.9 F (36.6 C) (03/05 0430) Pulse Rate:  [76-132] 90 (03/05 0430) Resp:  [9-20] 20 (03/05 0430) BP: (100-169)/(73-104) 100/73 (03/05 0430) SpO2:  [90 %-100 %] 96 % (03/05 0430) Weight:  [99.8 kg] 99.8 kg (03/04 1002)     Height: 5\' 7"  (170.2 cm) Weight: 99.8 kg BMI (Calculated): 34.45   Intake/Output last 2 shifts:  03/04 0701 - 03/05 0700 In: 1825 [I.V.:1725; IV Piggyback:100] Out: [id number] [Urine:1375; Drains:385; Blood:35]   Physical Exam:  Constitutional: alert, cooperative and no distress  Respiratory: breathing non-labored at rest  Cardiovascular: regular rate and sinus rhythm  Gastrointestinal: soft, lower abdominal tenderness, and non-distended. No rebound or guarding. Surgical drain in RLQ with bloody appearing drainage Integumentary: Laparotomy and previous colostomy site covered with Prevena, good seal   Labs:  CBC Latest Ref Rng & Units 05/04/2019 05/01/2019 01/09/2019  WBC 4.0 - 10.5 K/uL 22.8(H) 11.0(H) 9.8  Hemoglobin 13.0 - 17.0 g/dL 15.1 17.5(H) 13.4  Hematocrit 39.0 - 52.0 % 44.3 52.4(H) 36.0(L)  Platelets 150 - 400 K/uL 360 395 765(H)   CMP Latest Ref Rng & Units 05/04/2019 05/01/2019 01/09/2019  Glucose 70 - 99 mg/dL 129(H) 95 121(H)  BUN 6 - 20 mg/dL 20 9 8   Creatinine 0.61 - 1.24 mg/dL 1.79(H) 0.83 0.79  Sodium 135 - 145 mmol/L 138 136 137   Potassium 3.5 - 5.1 mmol/L 4.6 4.5 4.9  Chloride 98 - 111 mmol/L 104 100 99  CO2 22 - 32 mmol/L 23 27 29   Calcium 8.9 - 10.3 mg/dL 8.0(L) 9.1 9.3  Total Protein 6.5 - 8.1 g/dL - - -  Total Bilirubin 0.3 - 1.2 mg/dL - - -  Alkaline Phos 38 - 126 U/L - - -  AST 15 - 41 U/L - - -  ALT 0 - 44 U/L - - -     Imaging studies: No new pertinent imaging studies   Assessment/Plan:  44 y.o. male with bump in leukocytosis likely a result of recent surgery/jistory of splenectomy and AKI secondary to likely dehydration from bowel prep otherwise doing well with return of bowel function 1 Day Post-Op s/p colostomy takedown and parastomal hernia repair   - Start CLD  - Continue IVF resuscitation given AKI; recheck in morning  - Continue foley catheter today  - pain control prn (stop Toradol); antiemetics prn  - monitor abdominal examination; on-going bowel function  - Continue JP drain; monitor and record output  - Mobilization encouraged  - medical management of comorbid conditions   - DVT Prophylaxis; Hold today; restart tomorrow  All of the above findings and recommendations were discussed with the patient, and the medical team, and all of patient's questions were answered to his expressed satisfaction.  -- Edison Simon, PA-C [facility] 05/04/2019, 8:11 AM [phone] M-F: 7am - 4pm

## 2019-05-05 LAB — BASIC METABOLIC PANEL WITH GFR
Anion gap: 8 (ref 5–15)
BUN: 17 mg/dL (ref 6–20)
CO2: 24 mmol/L (ref 22–32)
Calcium: 8 mg/dL — ABNORMAL LOW (ref 8.9–10.3)
Chloride: 104 mmol/L (ref 98–111)
Creatinine, Ser: 0.86 mg/dL (ref 0.61–1.24)
GFR calc Af Amer: >60 mL/min
GFR calc non Af Amer: >60 mL/min
Glucose, Bld: 128 mg/dL — ABNORMAL HIGH (ref 70–99)
Potassium: 4 mmol/L (ref 3.5–5.1)
Sodium: 136 mmol/L (ref 135–145)

## 2019-05-05 LAB — CBC
HCT: 33.8 % — ABNORMAL LOW (ref 39.0–52.0)
Hemoglobin: 11.3 g/dL — ABNORMAL LOW (ref 13.0–17.0)
MCH: 29.6 pg (ref 26.0–34.0)
MCHC: 33.4 g/dL (ref 30.0–36.0)
MCV: 88.5 fL (ref 80.0–100.0)
Platelets: 305 K/uL (ref 150–400)
RBC: 3.82 MIL/uL — ABNORMAL LOW (ref 4.22–5.81)
RDW: 15.8 % — ABNORMAL HIGH (ref 11.5–15.5)
WBC: 17.5 K/uL — ABNORMAL HIGH (ref 4.0–10.5)
nRBC: 0 % (ref 0.0–0.2)

## 2019-05-05 MED ORDER — OXYCODONE HCL 5 MG PO TABS: 5.0000 mg | ORAL_TABLET | ORAL | Status: DC | PRN

## 2019-05-05 MED ORDER — HYDROMORPHONE HCL 1 MG/ML IJ SOLN
1.0000 mg | INTRAMUSCULAR | Status: DC | PRN
  Administered 2019-05-05 – 2019-05-07 (×13): 1 mg via INTRAVENOUS
  Filled 2019-05-05 (×13): qty 1

## 2019-05-05 NOTE — Progress Notes (Signed)
05/05/2019  Subjective: Patient is 2 Days Post-Op status post colostomy takedown with parastomal hernia repair.  No acute events overnight.  Patient reports that the Exparel effects have worn off and overnight he started having more pain in his abdomen.  His labs today have improved with his creatinine normalized.  His white blood cell count is improving.  His hemoglobin is down to 11.3 but I do not think he has lost that much prognosis JP drain output has also decreased.  He had 2 bowel movements yesterday reports that today he is not passing much gas.  Vital signs: Temp:  [98.3 F (36.8 C)-98.4 F (36.9 C)] 98.3 F (36.8 C) (03/06 0519) Pulse Rate:  [83-106] 106 (03/06 0519) Resp:  [15-20] 20 (03/06 0519) BP: (108-126)/(70-83) 126/83 (03/06 0519) SpO2:  [96 %-99 %] 96 % (03/06 0519)   Intake/Output: 03/05 0701 - 03/06 0700 In: 1322.6 [P.O.:358; I.V.:964.6] Out: 2505 [Urine:2280; Drains:225] Last BM Date: 05/03/19  Physical Exam: Constitutional: No acute distress Abdomen: Soft, mildly distended, appropriately tender to palpation over the incisions.  Incisions are covered right now with Prevena vac which is intact with good seal.  Blake drain with somewhat sanguinous fluid.  Labs:  Recent Labs    05/04/19 0406 05/05/19 0732  WBC 22.8* 17.5*  HGB 15.1 11.3*  HCT 44.3 33.8*  PLT 360 305   Recent Labs    05/04/19 0406 05/05/19 0732  NA 138 136  K 4.6 4.0  CL 104 104  CO2 23 24  GLUCOSE 129* 128*  BUN 20 17  CREATININE 1.79* 0.86  CALCIUM 8.0* 8.0*   No results for input(s): LABPROT, INR in the last 72 hours.  Imaging: No results found.  Assessment/Plan: This is a 44 y.o. male s/p colostomy takedown and parastomal hernia repair.  -We will advance the patient's diet to full liquid diet today.  Prior to advancing further, discussed with patient that I would want him to have more consistent bowel function.  He did have quite prolonged post-op ileus back in  October. --Decrease IV fluids --D/C foley today --Continue holding lovenox and toradol as there's still sanguinuos fluid in the drain.  Likely tomorrow we'll be able to resume. --repeat labs in [place], MD [facility]

## 2019-05-06 LAB — CBC
HCT: 35 % — ABNORMAL LOW (ref 39.0–52.0)
Hemoglobin: 11.7 g/dL — ABNORMAL LOW (ref 13.0–17.0)
MCH: 29.8 pg (ref 26.0–34.0)
MCHC: 33.4 g/dL (ref 30.0–36.0)
MCV: 89.3 fL (ref 80.0–100.0)
Platelets: 352 10*3/uL (ref 150–400)
RBC: 3.92 MIL/uL — ABNORMAL LOW (ref 4.22–5.81)
RDW: 15.9 % — ABNORMAL HIGH (ref 11.5–15.5)
WBC: 17 10*3/uL — ABNORMAL HIGH (ref 4.0–10.5)
nRBC: 0 % (ref 0.0–0.2)

## 2019-05-06 LAB — BASIC METABOLIC PANEL
Anion gap: 9 (ref 5–15)
BUN: 7 mg/dL (ref 6–20)
CO2: 29 mmol/L (ref 22–32)
Calcium: 8.8 mg/dL — ABNORMAL LOW (ref 8.9–10.3)
Chloride: 101 mmol/L (ref 98–111)
Creatinine, Ser: 0.8 mg/dL (ref 0.61–1.24)
GFR calc Af Amer: >60 mL/min (ref >60)
GFR calc non Af Amer: >60 mL/min (ref >60)
Glucose, Bld: 110 mg/dL — ABNORMAL HIGH (ref 70–99)
Potassium: 5 mmol/L (ref 3.5–5.1)
Sodium: 139 mmol/L (ref 135–145)

## 2019-05-06 MED ORDER — KETOROLAC TROMETHAMINE 30 MG/ML IJ SOLN
30.0000 mg | Freq: Four times a day (QID) | INTRAMUSCULAR | Status: DC
  Administered 2019-05-06 – 2019-05-07 (×5): 30 mg via INTRAVENOUS
  Filled 2019-05-06 (×5): qty 1

## 2019-05-06 MED ORDER — ENOXAPARIN SODIUM 40 MG/0.4ML ~~LOC~~ SOLN
40.0000 mg | SUBCUTANEOUS | Status: DC
  Administered 2019-05-06 – 2019-05-07 (×2): 40 mg via SUBCUTANEOUS
  Filled 2019-05-06 (×2): qty 0.4

## 2019-05-06 NOTE — Plan of Care (Signed)

## 2019-05-06 NOTE — Progress Notes (Signed)
05/06/2019  Subjective: Patient is 3 Days Post-Op s/p colostomy takedown and parastomal hernia repair.  No acute events overnight.  Patient reports that pain is doing a little bit better.  JP output has decreased.  White blood cell count stable.  Hemoglobin stable.  Creatinine normal still.  Patient had flatus but no bowel movement yesterday.  Vital signs: Temp:  [98.4 F (36.9 C)-98.6 F (37 C)] 98.6 F (37 C) (03/07 0440) Pulse Rate:  [96-98] 96 (03/07 0440) Resp:  [18] 18 (03/07 0440) BP: (136)/(80-92) 136/92 (03/07 0440) SpO2:  [96 %] 96 % (03/07 0440)   Intake/Output: 03/06 0701 - 03/07 0700 In: 2272.5 [P.O.:240; I.V.:2032.5] Out: 3055 [Urine:2900; Drains:155] Last BM Date: 05/06/19  Physical Exam: Constitutional: No acute distress Abdomen: Soft, nondistended, appropriately tender to palpation.  Incisions with Prevena VAC in place.  JP drain with somewhat sanguinous fluid.  Labs:  Recent Labs    05/05/19 0732 05/06/19 0513  WBC 17.5* 17.0*  HGB 11.3* 11.7*  HCT 33.8* 35.0*  PLT 305 352   Recent Labs    05/05/19 0732 05/06/19 0513  NA 136 139  K 4.0 5.0  CL 104 101  CO2 24 29  GLUCOSE 128* 110*  BUN 17 7  CREATININE 0.86 0.80  CALCIUM 8.0* 8.8*   No results for input(s): LABPROT, INR in the last 72 hours.  Imaging: No results found.  Assessment/Plan: This is a 44 y.o. male s/p colostomy takedown and parastomal hernia repair.  -We will advance the patient's diet to a soft diet today. -Hemoglobin stable and JP output has decreased.  We will start again DVT prophylaxis with Lovenox as well as Toradol for pain control. -Patient continues to do well, will be able to potentially DC home tomorrow.   Melvyn Neth, [facility]

## 2019-05-07 LAB — CBC
HCT: 32.3 % — ABNORMAL LOW (ref 39.0–52.0)
Hemoglobin: 11.1 g/dL — ABNORMAL LOW (ref 13.0–17.0)
MCH: 30.1 pg (ref 26.0–34.0)
MCHC: 34.4 g/dL (ref 30.0–36.0)
MCV: 87.5 fL (ref 80.0–100.0)
Platelets: 395 10*3/uL (ref 150–400)
RBC: 3.69 MIL/uL — ABNORMAL LOW (ref 4.22–5.81)
RDW: 15.6 % — ABNORMAL HIGH (ref 11.5–15.5)
WBC: 14 10*3/uL — ABNORMAL HIGH (ref 4.0–10.5)
nRBC: 0 % (ref 0.0–0.2)

## 2019-05-07 MED ORDER — OXYCODONE HCL 5 MG PO TABS: 5.0000 mg | ORAL_TABLET | ORAL | 0 refills | Status: DC | PRN

## 2019-05-07 MED ORDER — IBUPROFEN 800 MG PO TABS: 800.0000 mg | ORAL_TABLET | Freq: Three times a day (TID) | ORAL | 0 refills | Status: DC | PRN

## 2019-05-07 NOTE — Discharge Summary (Signed)
[facility] SUR[facility]ICAL DISCHARGE SUMMARY   Patient ID: Brandon Mullen MRN: [id number] DOB/AGE: September 27, 1975 44 y.o.  Admit date: 05/03/2019 Discharge date: 05/07/2019  Discharge Diagnoses Patient Active Problem List   Diagnosis Date Noted  . Colostomy in place [facility]) 05/03/2019  . Parastomal hernia without obstruction or gangrene   . Diverticulitis of intestine with abscess without bleeding 12/25/2018  . 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 Alcohol abuse 06/23/2013  . Unspecified hypothyroidism 06/04/2013  . MDD (major depressive disorder), recurrent episode ([place]) 06/02/2013  . Anxiety state, unspecified 06/02/2013    Consultants none  Procedures 05/03/2019:  1.  Colostomy reversal with colorectal anastomosis 2.  splenic flexure mobilization 3.  repair of parastomal hernia 4.  excision of skin and subcutaneous tissue of 70 sqcm,  5.  application of wound vac < 50 cm.   HPI: Brandon Mullen is a 44 y.o. male with a history of perforated diverticulitis with purulent peritonitis s/p Hartman's Procedure on 12/27/18 now with parastomal hernia who presents to [facility] on 05/03/2019 for colostomy takedown and parastomal hernia repair.   Hospital Course: Informed consent was obtained and documented, and patient underwent uneventful colostomy takedown and parastomal hernia repair. (Dr Hampton Abbot, 05/03/2019). Post-operatively, patient did well and his bowel function return. He had some difficulty with pain control but did well with PO pain medication regiment. Advancement of patient's diet and ambulation were well-tolerated. The remainder of patient's hospital course was essentially unremarkable, and discharge planning was initiated accordingly with patient safely able to be discharged home with appropriate discharge instructions, pain control, and outpatient follow-up after all of his questions were answered to his expressed  satisfaction.   Discharge Condition: Good   Physical Examination:  Constitutional: Well appearing male, NAD Pulmonary: Normal effort, no respiratory distress Gastrointestinal: Soft, incisional soreness, mild distension, no rebound/guarding, JP in RLQ with serosanguinous output Skin: Prevena vac to the midline wound   Allergies as of 05/07/2019      Reactions   Morphine And Related    cramping      Medication List    TAKE these medications   ibuprofen 800 MG tablet Commonly known as: ADVIL Take 1 tablet (800 mg total) by mouth every 8 (eight) hours as needed.   levothyroxine 175 MCG tablet Commonly known as: SYNTHROID Take 175 mcg by mouth daily before breakfast.   lisinopril 20 MG tablet Commonly known as: ZESTRIL Take 20 mg by mouth every morning.   oxyCODONE 5 MG immediate release tablet Commonly known as: Oxy IR/ROXICODONE Take 1 tablet (5 mg total) by mouth every 4 (four) hours as needed for moderate pain, severe pain or breakthrough pain.        Follow-up Information    Olean Ree, MD. Schedule an appointment as soon as possible for a visit on 05/11/2019.   Specialty: General Surgery Why: s/p colostomy takedown, Jp drain in place, Prevena in place Contact information: [address] [place] [address] [phone]            Time spent on discharge management including discussion of hospital course, clinical condition, outpatient instructions, prescriptions, and follow up with the patient and members of the medical team: >30 minutes  -- Edison Simon , PA-C  [facility]  05/07/2019, 10:18 AM [phone] M-F: 7am - 4pm

## 2019-05-08 LAB — SURGICAL PATHOLOGY

## 2019-05-11 ENCOUNTER — Other Ambulatory Visit: Payer: Self-pay

## 2019-05-11 ENCOUNTER — Encounter: Payer: Self-pay | Admitting: Surgery

## 2019-05-11 ENCOUNTER — Ambulatory Visit (INDEPENDENT_AMBULATORY_CARE_PROVIDER_SITE_OTHER): Payer: Self-pay | Admitting: Surgery

## 2019-05-11 VITALS — BP 136/85 | HR 132 | Temp 95.2°F | Ht 68.0 in | Wt 216.4 lb

## 2019-05-11 DIAGNOSIS — K5720 Diverticulitis of large intestine with perforation and abscess without bleeding: Secondary | ICD-10-CM

## 2019-05-11 DIAGNOSIS — Z09 Encounter for follow-up examination after completed treatment for conditions other than malignant neoplasm: Secondary | ICD-10-CM

## 2019-05-11 DIAGNOSIS — K435 Parastomal hernia without obstruction or  gangrene: Secondary | ICD-10-CM

## 2019-05-11 DIAGNOSIS — Z933 Colostomy status: Secondary | ICD-10-CM

## 2019-05-11 MED ORDER — OXYCODONE HCL 5 MG PO TABS: 5.0000 mg | ORAL_TABLET | ORAL | 0 refills | Status: DC | PRN

## 2019-05-11 MED ORDER — GABAPENTIN 300 MG PO CAPS: 300.0000 mg | ORAL_CAPSULE | Freq: Three times a day (TID) | ORAL | 1 refills | Status: DC

## 2019-05-11 NOTE — Progress Notes (Signed)
05/11/2019  HPI: Brandon Mullen is a 44 y.o. male s/p colostomy takedown and repair of parastomal hernia on 3/4.  Presents today for follow up.  Has Prevena vac over his midline and ostomy incisions which are closed with staples, and Blake drain in pelvis.  Drain output has been low, <30 ml per day.  Vac has not had any output.  He report that he still has a lot of pain over his incisions.  He has bruised a lot as well.  Otherwise, normal bowel function.  Vital signs: BP 136/85   Pulse (!) 132   Temp (!) 95.2 F (35.1 C) (Temporal)   Ht 5\' 8"  (1.727 m)   Wt 216 lb 6.4 oz (98.2 kg)   SpO2 98%   BMI 32.90 kg/m    Physical Exam: Constitutional: No acute distress Abdomen:  Soft, non-distended, obese, appropriately tender to palpation over the midline and LLQ incisions.  Prevena vac removed, revealing ecchymosis surrounding both incisions.  There is ecchymosis also radiating toward the left lower flank.  Blake drain with dark serosanguinous fluid, low volume.  Drain removed without complications and dry gauze dressing applied to drain site and the incisions.  Assessment/Plan: This is a 44 y.o. male s/p colostomy takedown with parastomal hernia repair.  --Discussed with the patient that he does have significant bruising, but it will continue to improve on its own.  No evidence of any infection of the wounds.  We'll leave the staples until next week for removal. --Will prescribe another round of Oxycodone, and will add neurontin to the meds for additional pain control.  After his last surgery, he also needed a refill of oxycodone and we ordered flexeril as well, but he reports the flexeril did not help as much.  He asked about a stronger narcotic, but I think oxycodone will be enough and would rather not order anything stronger at this point. --Follow up 1 week for staple removal.   Melvyn Neth, [place]

## 2019-05-11 NOTE — Patient Instructions (Addendum)
Dr.Piscoya advised patient he would refill prescription Oxycodone 5 mg. Patient instructed to take 1-2 tablets every 4 hours as needed. Patient advised to change his dressing once a day. If patient noticed more saturated gauzes, advised to change more than once a day. Patient will apply dry gauze to the site of drain removal.  GENERAL POST-OPERATIVE PATIENT INSTRUCTIONS   FOLLOW-UP:  Please make an appointment with your physician in.  Call your physician immediately if you have any fevers greater than 102.5, drainage from you wound that is not clear or looks infected, persistent bleeding, increasing abdominal pain, problems urinating, or persistent nausea/vomiting.    WOUND CARE INSTRUCTIONS:  Keep a dry clean dressing on the wound if there is drainage. The initial bandage may be removed after 24 hours.  Once the wound has quit draining you may leave it open to air.  If clothing rubs against the wound or causes irritation and the wound is not draining you may cover it with a dry dressing during the daytime.  Try to keep the wound dry and avoid ointments on the wound unless directed to do so.  If the wound becomes bright red and painful or starts to drain infected material that is not clear, please contact your physician immediately.  If the wound is mildly pink and has a thick firm ridge underneath it, this is normal, and is referred to as a healing ridge.  This will resolve over the next 4-6 weeks.  DIET:  You may eat any foods that you can tolerate.  It is a good idea to eat a high fiber diet and take in plenty of fluids to prevent constipation.  If you do become constipated you may want to take a mild laxative or take ducolax tablets on a daily basis until your bowel habits are regular.  Constipation can be very uncomfortable, along with straining, after recent surgery.  ACTIVITY:  You are encouraged to cough and deep breath or use your incentive spirometer if you were given one, every 15-30 minutes  when awake.  This will help prevent respiratory complications and low grade fevers post-operatively if you had a general anesthetic.  You may want to hug a pillow when coughing and sneezing to add additional support to the surgical area, if you had abdominal or chest surgery, which will decrease pain during these times.  You are encouraged to walk and engage in light activity for the next two weeks.  You should not lift more than 20 pounds during this time frame as it could put you at increased risk for complications.  Twenty pounds is roughly equivalent to a plastic bag of groceries.    MEDICATIONS:  Try to take narcotic medications and anti-inflammatory medications, such as tylenol, ibuprofen, naprosyn, etc., with food.  This will minimize stomach upset from the medication.  Should you develop nausea and vomiting from the pain medication, or develop a rash, please discontinue the medication and contact your physician.  You should not drive, make important decisions, or operate machinery when taking narcotic pain medication.  QUESTIONS:  Please feel free to call your physician or the hospital operator if you have any questions, and they will be glad to assist you.

## 2019-05-16 ENCOUNTER — Telehealth: Payer: Self-pay | Admitting: *Deleted

## 2019-05-16 NOTE — Telephone Encounter (Signed)
Faxed FMLA to [phone]

## 2019-05-18 ENCOUNTER — Ambulatory Visit (INDEPENDENT_AMBULATORY_CARE_PROVIDER_SITE_OTHER): Payer: Self-pay | Admitting: Surgery

## 2019-05-18 ENCOUNTER — Other Ambulatory Visit: Payer: Self-pay

## 2019-05-18 ENCOUNTER — Encounter: Payer: Self-pay | Admitting: Surgery

## 2019-05-18 VITALS — BP 161/83 | HR 110 | Temp 97.6°F | Resp 14 | Ht 67.0 in | Wt 218.8 lb

## 2019-05-18 DIAGNOSIS — Z933 Colostomy status: Secondary | ICD-10-CM

## 2019-05-18 DIAGNOSIS — Z09 Encounter for follow-up examination after completed treatment for conditions other than malignant neoplasm: Secondary | ICD-10-CM

## 2019-05-18 DIAGNOSIS — K435 Parastomal hernia without obstruction or  gangrene: Secondary | ICD-10-CM

## 2019-05-18 MED ORDER — OXYCODONE HCL 5 MG PO TABS: 5.0000 mg | ORAL_TABLET | Freq: Four times a day (QID) | ORAL | 0 refills | Status: DC | PRN

## 2019-05-18 NOTE — Progress Notes (Signed)
05/18/2019  HPI: Brandon Mullen is a 44 y.o. male s/p colostomy takedown and repair of parastomal hernia on 05/03/19.  He presents for follow up.  His Prevena vac and Blake drain were removed last week and he presents for staple removal.  Overall, he is still having incisional pain.  The gabapentin started last week seems to be helping, and he's also taking ibuprofen and oxycodone.  His ostomy incision site has swollen as well, which causes some pain as well.  Reports some serous drainage between staples.  Denies any nausea or vomiting, and is having bowel function.  Vital signs: BP (!) 161/83   Pulse (!) 110   Temp 97.6 F (36.4 C)   Resp 14   Ht 5\' 7"  (1.702 m)   Wt 218 lb 12.8 oz (99.2 kg)   SpO2 94%   BMI 34.27 kg/m    Physical Exam: Constitutional: No acute distress Abdomen:  Soft, non-distended, with appropriate tenderness to palpation at the midline and LLQ incisions.  Incisions with staples clean, dry, without any evidence of wound breakdown or separation.  The LLQ incision has significant swelling, that is soft, where he had a lot of ecchymosis the week prior.  I think this feels like a hematoma given the prior ecchymosis.  Staples were removed and changed to steri strips without complications.  Assessment/Plan: This is a 44 y.o. male s/p colostomy takedown and parastomal hernia repair.  --Discussed with the patient that I think the distention on the LLQ incision is due to a large hematoma that's developed there.  Although in theory the fascia could have separated, he does not describe any ripping or popping sensation and the pain has not significant worsened to indicate that.  Recommended that he continue with the abdominal binder to help with compression, and he can apply ice packs to the area to help as well with swelling.  The hematoma will reabsorb on its own, though it will take time. At this point there is no evidence of infection. --Patient can continue gabapentin, ibuprofen  and will refill oxycodone.   --Follow up in two weeks.  If this is not improving, or if there is any worsening, would get CT scan to evaluate.   Melvyn Neth, [facility]

## 2019-05-18 NOTE — Patient Instructions (Signed)
Your staples were removed and steri-strips were placed. These will begin to fall within 7-10days. You may resume your showers as normally. You may change the gauze once a day as needed if oozing. Just be careful with the steri-strips.  Continue to use the binder as needed for pain. You may alternate heat and ice to the area of hematoma. The swelling should begin to go down. If you feel like it is getting bigger and pain is worst give the office a call before your appointment.   Continue the gabapentin and ibuprofen for pain. Pick up your prescription at your local pharmacy.   We will see you in 2 weeks for a follow up. Call the office if you have any questions or concerns.

## 2019-05-21 ENCOUNTER — Telehealth: Payer: Self-pay | Admitting: *Deleted

## 2019-05-21 NOTE — Telephone Encounter (Signed)
Patient called and stated that his wound has dark red blood discharged, started Sunday morning. Having to change the bandage 3 times daily. Patient wants to know what he needs to do and is this normal.   Had surgery on 05/03/19 by Dr Hampton Abbot Colostomy takedown. Staples were removed and steri stips placed on 05/18/19

## 2019-05-21 NOTE — Telephone Encounter (Signed)
Per Dr Hampton Abbot advised me to instruct patient to continue with his current dressing changes. Dr Hampton Abbot also wanted me to notify patient the blood is from the hematoma that the patient and Dr Hampton Abbot discuss at patient's last appt. Patient was made aware that if he notices the wound getting worse. To give our office a call to come in and be seen. Patient verbalized understanding and has no further questions.

## 2019-05-22 ENCOUNTER — Telehealth: Payer: Self-pay | Admitting: Surgery

## 2019-05-22 ENCOUNTER — Other Ambulatory Visit: Payer: Self-pay

## 2019-05-22 MED ORDER — GABAPENTIN 300 MG PO CAPS: 300.0000 mg | ORAL_CAPSULE | Freq: Three times a day (TID) | ORAL | 1 refills | Status: DC

## 2019-05-22 NOTE — Telephone Encounter (Signed)
Patient called and is asking for a refill on his pain medication. Please call patient and advise.

## 2019-05-22 NOTE — Telephone Encounter (Signed)
Patient would like a refill on his Oxycodone. He is not out but does not want to run out before he is seen next Monday 06/04/19. I told him I would send a message to Dr Hampton Abbot about this.

## 2019-05-24 ENCOUNTER — Encounter: Payer: Self-pay | Admitting: Surgery

## 2019-05-24 ENCOUNTER — Other Ambulatory Visit: Payer: Self-pay | Admitting: Surgery

## 2019-05-24 MED ORDER — OXYCODONE HCL 5 MG PO TABS: 5.0000 mg | ORAL_TABLET | Freq: Four times a day (QID) | ORAL | 0 refills | Status: DC | PRN

## 2019-06-04 ENCOUNTER — Other Ambulatory Visit: Payer: Self-pay

## 2019-06-04 ENCOUNTER — Ambulatory Visit (INDEPENDENT_AMBULATORY_CARE_PROVIDER_SITE_OTHER): Payer: BC Managed Care – PPO | Admitting: Surgery

## 2019-06-04 ENCOUNTER — Encounter: Payer: Self-pay | Admitting: Surgery

## 2019-06-04 VITALS — BP 128/82 | HR 91 | Temp 97.7°F | Ht 67.0 in | Wt 219.0 lb

## 2019-06-04 DIAGNOSIS — M7981 Nontraumatic hematoma of soft tissue: Secondary | ICD-10-CM

## 2019-06-04 DIAGNOSIS — Z09 Encounter for follow-up examination after completed treatment for conditions other than malignant neoplasm: Secondary | ICD-10-CM

## 2019-06-04 DIAGNOSIS — IMO0002 Reserved for concepts with insufficient information to code with codable children: Secondary | ICD-10-CM

## 2019-06-04 MED ORDER — GABAPENTIN 100 MG PO CAPS: 100.0000 mg | ORAL_CAPSULE | Freq: Three times a day (TID) | ORAL | 0 refills | Status: DC

## 2019-06-04 NOTE — Progress Notes (Signed)
06/04/2019  HPI: Brandon Mullen is a 44 y.o. male s/p open colostomy takedown and repair of parastomal hernia on 05/03/19.  Patient was last seen on 3/19.  He had a moderate size hematoma at the site of the prior colostomy.  He presents today for follow up.  He reports that the hematoma and ecchymosis have significantly improved.  He still has pain, and is using the Gabapentin, but it makes him very drowsy.  He still has drainage from the ostomy site, but that has also improved.  Vital signs: BP 128/82   Pulse 91   Temp 97.7 F (36.5 C)   Ht 5\' 7"  (1.702 m)   Wt 219 lb (99.3 kg)   SpO2 97%   BMI 34.30 kg/m    Physical Exam: Constitutional: No acute distress Abdomen:  Soft, non-distended, with some tenderness particularly in the left lower quadrant at the site of the prior colostomy.  The hematoma distention has significantly improved, and the area is much softer now.  Ecchymosis has resolved. There are two exposed areas along the incision, revealing subcutaneous tissue.  The medial portion is about 4 mm in size, draining serosanguinous fluid, and the lateral portion is about 2 cm in size, also with same drainage.  No purulent drainage, no evidence of infection.  Assessment/Plan: This is a 44 y.o. male s/p colostomy takedown and repair of parastomal hernia.  --Discussed with the patient that he is definitely improving compared to last time I saw him two weeks ago.  Reassured him that the drainage will continue to slow down and improve.  The drainage is benign and there is no evidence of infection.  No debridement is needed. --Offered patient to treat the exposed areas with silver nitrate but he declined. --Continue dry gauze dressing changes to the two areas daily and as needed. --Gave him a prescription for 100 mg Gabapentin instead to see if that can help with pain but not as drowsy. --Follow up in two weeks.   Melvyn Neth, [facility]

## 2019-06-04 NOTE — Patient Instructions (Addendum)
Your hematoma will continue to heal and get better. Keep covered until it stops draining.  Continue to use the abdominal binder as needed.  May use a heating pad to the surgical area several times a day.   Try taking 100-200 mg of Gabapentin instead of the 300 mg three times a day.  May continue to use Ibuprofen also-600-800 mg three times a day as needed.  Follow up here in 2 weeks.

## 2019-06-15 ENCOUNTER — Ambulatory Visit (INDEPENDENT_AMBULATORY_CARE_PROVIDER_SITE_OTHER): Payer: Self-pay | Admitting: Surgery

## 2019-06-15 ENCOUNTER — Encounter: Payer: Self-pay | Admitting: Surgery

## 2019-06-15 ENCOUNTER — Other Ambulatory Visit: Payer: Self-pay

## 2019-06-15 VITALS — BP 134/96 | HR 87 | Temp 97.2°F | Ht 67.0 in | Wt 223.4 lb

## 2019-06-15 DIAGNOSIS — M7981 Nontraumatic hematoma of soft tissue: Secondary | ICD-10-CM

## 2019-06-15 DIAGNOSIS — IMO0002 Reserved for concepts with insufficient information to code with codable children: Secondary | ICD-10-CM

## 2019-06-15 DIAGNOSIS — Z09 Encounter for follow-up examination after completed treatment for conditions other than malignant neoplasm: Secondary | ICD-10-CM

## 2019-06-15 NOTE — Patient Instructions (Addendum)
Dr.Piscoya removed dressing and cleaned the area and applied a dry, clean dressing at today's visit.   Dr.Piscoya applied gauze packing to the area to help the cavity heal faster. He advised the patient to change it once a day, preferably before he takes a shower and allow the warm, soapy water to run over the wound and then pat dry and then apply the packing with gauze to the area.

## 2019-06-15 NOTE — Progress Notes (Signed)
06/15/2019  HPI: Brandon Mullen is a 44 y.o. male s/p colostomy reversal on 05/03/19.  He had a post-operative hematoma over the colostomy site, which created two small wounds at the incision itself.  He presents for follow up.  The ecchymosis is fully resolved, and he continues doing dressing changes to the colostomy site.  Denies any worsening pain.  He's taking the Neurontin lower dose that we gave him last time.    Vital signs: BP (!) 134/96   Pulse 87   Temp (!) 97.2 F (36.2 C) (Temporal)   Ht 5\' 7"  (1.702 m)   Wt 223 lb 6.4 oz (101.3 kg)   SpO2 94%   BMI 34.99 kg/m    Physical Exam: Constitutional:  No acute distress Abdomen:  Soft, non-distended, non-tender to palpation.  The midline incision is well healed.  The colostomy incision had two prior open areas.  The medial portion is healed now, with thin skin covering.  The lateral portion is still open.  Upon probing the wound, there's significant slough and fibrinous tissue that was removed.  The wound was then packed with gauze and dressed with gauze/tape.  Assessment/Plan: This is a 44 y.o. male s/p colostomy reversal with a post-operative hematoma and partial wound dehiscence of the colostomy site.    --The patient is healing well, and the ecchymosis has completely resolved.  The medial portion of the wound is closed and there is a 2 x 2 cm opening on the lateral portion.  This was packed with 4x4 gauze.  Instructed the patient on wound packing so it can heal faster. --Follow up in two weeks to assess his wound.   Melvyn Neth, [facility]

## 2019-06-21 ENCOUNTER — Ambulatory Visit: Payer: BC Managed Care – PPO | Attending: Internal Medicine

## 2019-06-21 DIAGNOSIS — Z23 Encounter for immunization: Secondary | ICD-10-CM

## 2019-06-21 NOTE — Progress Notes (Signed)
   Covid-19 Vaccination Clinic  Name:  Brandon Mullen    MRN: [id number] DOB: 1975-05-30  06/21/2019  [name] was observed post Covid-19 immunization for 15 minutes without incident. He was provided with Vaccine Information Sheet and instruction to access the V-Safe system.   [name] was instructed to call 911 with any severe reactions post vaccine: Marland Kitchen Difficulty breathing  . Swelling of face and throat  . A fast heartbeat  . A bad rash all over body  . Dizziness and weakness   Immunizations Administered    Name Date Dose VIS Date Route   Pfizer COVID-19 Vaccine 06/21/2019 12:03 PM 0.3 mL 04/25/2018 Intramuscular   Manufacturer: [place]   Lot: [id number]   Page: [id number]

## 2019-06-27 ENCOUNTER — Telehealth: Payer: Self-pay | Admitting: Surgery

## 2019-06-27 ENCOUNTER — Other Ambulatory Visit: Payer: Self-pay

## 2019-06-27 ENCOUNTER — Encounter: Payer: Self-pay | Admitting: Surgery

## 2019-06-27 ENCOUNTER — Telehealth: Payer: Self-pay

## 2019-06-27 ENCOUNTER — Other Ambulatory Visit
Admission: RE | Admit: 2019-06-27 | Discharge: 2019-06-27 | Disposition: A | Discharge status: Home / Self Care | Payer: BC Managed Care – PPO | Source: Ambulatory Visit | Attending: Surgery | Admitting: Surgery

## 2019-06-27 DIAGNOSIS — K5720 Diverticulitis of large intestine with perforation and abscess without bleeding: Secondary | ICD-10-CM

## 2019-06-27 DIAGNOSIS — R1084 Generalized abdominal pain: Secondary | ICD-10-CM

## 2019-06-27 LAB — CBC WITH DIFFERENTIAL/PLATELET
Abs Immature Granulocytes: 0.13 10*3/uL — ABNORMAL HIGH (ref 0.00–0.07)
Basophils Absolute: 0.1 10*3/uL (ref 0.0–0.1)
Basophils Relative: 1 %
Eosinophils Absolute: 0 10*3/uL (ref 0.0–0.5)
Eosinophils Relative: 0 %
HCT: 40.2 % (ref 39.0–52.0)
Hemoglobin: 13.2 g/dL (ref 13.0–17.0)
Immature Granulocytes: 1 %
Lymphocytes Relative: 8 %
Lymphs Abs: 1.7 10*3/uL (ref 0.7–4.0)
MCH: 27.7 pg (ref 26.0–34.0)
MCHC: 32.8 g/dL (ref 30.0–36.0)
MCV: 84.3 fL (ref 80.0–100.0)
Monocytes Absolute: 1.6 10*3/uL — ABNORMAL HIGH (ref 0.1–1.0)
Monocytes Relative: 7 %
Neutro Abs: 18.9 10*3/uL — ABNORMAL HIGH (ref 1.7–7.7)
Neutrophils Relative %: 83 %
Platelets: 400 10*3/uL (ref 150–400)
RBC: 4.77 MIL/uL (ref 4.22–5.81)
RDW: 16.5 % — ABNORMAL HIGH (ref 11.5–15.5)
WBC: 22.5 10*3/uL — ABNORMAL HIGH (ref 4.0–10.5)
nRBC: 0.1 % (ref 0.0–0.2)

## 2019-06-27 NOTE — Telephone Encounter (Signed)
Patient is calling, about his wound said he also has a fever over 100 and also said he sent a message on mychart. Please call patient and advise.

## 2019-06-27 NOTE — Telephone Encounter (Signed)
Spoke with the patient and he states that he is having redness and pain around his wound that he has been packing. He reports that the increasing pain started yesterday and today he reports a fever of 101.6. He reports no new drainage. He does report chills last night.

## 2019-06-27 NOTE — Telephone Encounter (Signed)
Spoke with patient and Dr Hampton Abbot wants him to have a CT abdomen and pelvis with contrast prior to seeing him tomorrow to assess for possible abscess. He is scheduled for this at [facility] on 06/28/19 at 8:00 am. He will need to pick up his contrast today. He will need to arrive for his scan at 7:45 am and have nothing to eat or drink for 4 hours prior. He will then be seen here in office with Dr Hampton Abbot.  Patient aware of dates, times, and instructions.

## 2019-06-27 NOTE — Addendum Note (Signed)
Addended by: Lesly Rubenstein on: 06/27/2019 04:33 PM   Modules accepted: Orders

## 2019-06-27 NOTE — Telephone Encounter (Signed)
Spoke with Dr Dahlia Byes and would like the patient to have a CBC and CMP today and follow up in the office tomorrow with Dr Christian Mate. Patient notified and will be here.

## 2019-06-28 ENCOUNTER — Ambulatory Visit: Payer: BC Managed Care – PPO

## 2019-06-28 ENCOUNTER — Emergency Department: Payer: BC Managed Care – PPO

## 2019-06-28 ENCOUNTER — Other Ambulatory Visit: Payer: Self-pay

## 2019-06-28 ENCOUNTER — Encounter: Payer: BC Managed Care – PPO | Admitting: Surgery

## 2019-06-28 ENCOUNTER — Emergency Department
Admission: EM | Admit: 2019-06-28 | Discharge: 2019-06-28 | Disposition: A | Discharge status: Home / Self Care | Payer: BC Managed Care – PPO | Attending: Emergency Medicine | Admitting: Emergency Medicine

## 2019-06-28 ENCOUNTER — Encounter: Payer: Self-pay | Admitting: Emergency Medicine

## 2019-06-28 DIAGNOSIS — R1032 Left lower quadrant pain: Secondary | ICD-10-CM | POA: Diagnosis not present

## 2019-06-28 DIAGNOSIS — G8918 Other acute postprocedural pain: Secondary | ICD-10-CM | POA: Diagnosis present

## 2019-06-28 DIAGNOSIS — R509 Fever, unspecified: Secondary | ICD-10-CM | POA: Diagnosis not present

## 2019-06-28 DIAGNOSIS — L089 Local infection of the skin and subcutaneous tissue, unspecified: Secondary | ICD-10-CM | POA: Insufficient documentation

## 2019-06-28 DIAGNOSIS — T8149XA Infection following a procedure, other surgical site, initial encounter: Secondary | ICD-10-CM

## 2019-06-28 LAB — COMPREHENSIVE METABOLIC PANEL
ALT: 21 U/L (ref 0–44)
AST: 22 U/L (ref 15–41)
Albumin: 3.9 g/dL (ref 3.5–5.0)
Alkaline Phosphatase: 88 U/L (ref 38–126)
Anion gap: 7 (ref 5–15)
BUN: 9 mg/dL (ref 6–20)
CO2: 24 mmol/L (ref 22–32)
Calcium: 8.8 mg/dL — ABNORMAL LOW (ref 8.9–10.3)
Chloride: 102 mmol/L (ref 98–111)
Creatinine, Ser: 0.88 mg/dL (ref 0.61–1.24)
GFR calc Af Amer: >60 mL/min (ref >60)
GFR calc non Af Amer: >60 mL/min (ref >60)
Glucose, Bld: 152 mg/dL — ABNORMAL HIGH (ref 70–99)
Potassium: 3.6 mmol/L (ref 3.5–5.1)
Sodium: 133 mmol/L — ABNORMAL LOW (ref 135–145)
Total Bilirubin: 0.7 mg/dL (ref 0.3–1.2)
Total Protein: 7.5 g/dL (ref 6.5–8.1)

## 2019-06-28 LAB — CBC WITH DIFFERENTIAL/PLATELET
Abs Immature Granulocytes: 0.05 10*3/uL (ref 0.00–0.07)
Basophils Absolute: 0.1 10*3/uL (ref 0.0–0.1)
Basophils Relative: 1 %
Eosinophils Absolute: 0 10*3/uL (ref 0.0–0.5)
Eosinophils Relative: 0 %
HCT: 44 % (ref 39.0–52.0)
Hemoglobin: 14.3 g/dL (ref 13.0–17.0)
Immature Granulocytes: 0 %
Lymphocytes Relative: 14 %
Lymphs Abs: 2.2 10*3/uL (ref 0.7–4.0)
MCH: 27.3 pg (ref 26.0–34.0)
MCHC: 32.5 g/dL (ref 30.0–36.0)
MCV: 84.1 fL (ref 80.0–100.0)
Monocytes Absolute: 2.3 10*3/uL — ABNORMAL HIGH (ref 0.1–1.0)
Monocytes Relative: 14 %
Neutro Abs: 11.3 10*3/uL — ABNORMAL HIGH (ref 1.7–7.7)
Neutrophils Relative %: 71 %
Platelets: 399 10*3/uL (ref 150–400)
RBC: 5.23 MIL/uL (ref 4.22–5.81)
RDW: 16.5 % — ABNORMAL HIGH (ref 11.5–15.5)
WBC: 15.9 10*3/uL — ABNORMAL HIGH (ref 4.0–10.5)
nRBC: 0 % (ref 0.0–0.2)

## 2019-06-28 LAB — LACTIC ACID, PLASMA: Lactic Acid, Venous: 1.5 mmol/L (ref 0.5–1.9)

## 2019-06-28 MED ORDER — FENTANYL CITRATE (PF) 100 MCG/2ML IJ SOLN
50.0000 ug | Freq: Once | INTRAMUSCULAR | Status: AC
  Administered 2019-06-28: 50 ug via INTRAVENOUS
  Filled 2019-06-28: qty 2

## 2019-06-28 MED ORDER — SODIUM CHLORIDE 0.9 % IV BOLUS
1000.0000 mL | Freq: Once | INTRAVENOUS | Status: AC
  Administered 2019-06-28: 1000 mL via INTRAVENOUS

## 2019-06-28 MED ORDER — IOHEXOL 9 MG/ML PO SOLN
500.0000 mL | ORAL | Status: AC
  Administered 2019-06-28: 500 mL via ORAL

## 2019-06-28 MED ORDER — AMOXICILLIN-POT CLAVULANATE 875-125 MG PO TABS: 1.0000 | ORAL_TABLET | Freq: Two times a day (BID) | ORAL | 0 refills | Status: AC

## 2019-06-28 MED ORDER — OXYCODONE-ACETAMINOPHEN 5-325 MG PO TABS: 1.0000 | ORAL_TABLET | Freq: Four times a day (QID) | ORAL | 0 refills | Status: DC | PRN

## 2019-06-28 MED ORDER — PIPERACILLIN-TAZOBACTAM 3.375 G IVPB 30 MIN
3.3750 g | Freq: Once | INTRAVENOUS | Status: AC
  Administered 2019-06-28: 3.375 g via INTRAVENOUS
  Filled 2019-06-28: qty 50

## 2019-06-28 MED ORDER — IOHEXOL 300 MG/ML  SOLN
150.0000 mL | Freq: Once | INTRAMUSCULAR | Status: AC | PRN
  Administered 2019-06-28: 125 mL via INTRAVENOUS

## 2019-06-28 NOTE — ED Notes (Signed)
E-signature not working at this time. Pt verbalized understanding of D/C instructions, prescriptions and follow up care with no further questions at this time. Pt in NAD and ambulatory at time of D/C.  
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
## 2019-06-28 NOTE — Discharge Instructions (Addendum)
Please seek medical attention for any high fevers, chest pain, shortness of breath, change in behavior, persistent vomiting, bloody stool or any other new or concerning symptoms.  
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
## 2019-06-28 NOTE — ED Triage Notes (Signed)
Patient to ER for fever and redness to surgical site. Patient had colostomy reversal on 05/03/2019. States he developed "hole" after surgery. Highest fever yesterday was 102.5.

## 2019-06-28 NOTE — Addendum Note (Signed)
Addended by: Riki Sheer on: 06/28/2019 10:51 AM   Modules accepted: Orders

## 2019-06-29 ENCOUNTER — Encounter: Payer: BC Managed Care – PPO | Admitting: Surgery

## 2019-07-02 ENCOUNTER — Other Ambulatory Visit: Payer: Self-pay

## 2019-07-02 ENCOUNTER — Telehealth: Payer: Self-pay | Admitting: *Deleted

## 2019-07-02 ENCOUNTER — Ambulatory Visit (INDEPENDENT_AMBULATORY_CARE_PROVIDER_SITE_OTHER): Payer: Self-pay | Admitting: Surgery

## 2019-07-02 ENCOUNTER — Encounter: Payer: Self-pay | Admitting: Surgery

## 2019-07-02 VITALS — BP 141/92 | HR 88 | Temp 97.9°F | Resp 14 | Ht 67.0 in | Wt 221.8 lb

## 2019-07-02 DIAGNOSIS — Z09 Encounter for follow-up examination after completed treatment for conditions other than malignant neoplasm: Secondary | ICD-10-CM

## 2019-07-02 DIAGNOSIS — T8149XA Infection following a procedure, other surgical site, initial encounter: Secondary | ICD-10-CM

## 2019-07-02 MED ORDER — OXYCODONE-ACETAMINOPHEN 5-325 MG PO TABS: 1.0000 | ORAL_TABLET | Freq: Four times a day (QID) | ORAL | 0 refills | Status: DC | PRN

## 2019-07-02 NOTE — Addendum Note (Signed)
Addended by: Riki Sheer on: 07/02/2019 04:09 PM   Modules accepted: Orders

## 2019-07-02 NOTE — Progress Notes (Addendum)
07/02/2019  HPI: Brandon Mullen is a 44 y.o. male s/p colostomy takedown on 05/03/19.  He developed a large hematoma at the colostomy site, and he developed a small open wound that is getting packing dressing changes daily.  He was also seen in the ER on 4/29 because of cellulitis and tenderness of the inferior portion of his midline incision, associated with fevers of 102.  CT scan did not show any intra-abdominal issues, but showed cellulitis and induration of the inferior portion of the wound.  He was started on Augmentin and presents today for close follow up.  Since starting the Augmentin, he has not had any further fevers.  The pain is somewhat improved though still persists as pressure, and the redness and swelling is improved as well.  He continues doing packing dressing changes to the ostomy site.  Vital signs: BP (!) 141/92   Pulse 88   Temp 97.9 F (36.6 C)   Resp 14   Ht 5\' 7"  (1.702 m)   Wt 221 lb 12.8 oz (100.6 kg)   SpO2 97%   BMI 34.74 kg/m    Physical Exam: Constitutional:  No acute distress Abdomen:  Soft, non-distended, with some tenderness to palpation over the inferior half of the midline wound.  The skin is much improved, and there is no real erythema or induration, but residual changes from his cellulitis such as dry scaly skin.  No fluctuance or wound dehiscence, and no evidence of hernia.  The ostomy site wound is healing well, with good granulation tissue, without any purulent drainage.  Packed with 2x2 gauze and dressed with gauze/tape.  Assessment/Plan: This is a 44 y.o. male s/p colostomy takedown on 3/4, with large hematoma of prior ostomy site, and inferior wound cellulitis.  --Discussed with the patient that the inferior portion of the wound is significantly better today compared to just 4 days ago.  He should continue the antibiotic course until completed.  He can apply moisturizing lotion to the incision to help with the dryness.  No acute surgical needs from  that standpoint.  Will do another refill of Percocet given in ER. --He should also continue to do packing dressing changes to the ostomy site.  It is healing well. --Follow up in two weeks to reassess.   Melvyn Neth, [facility]

## 2019-07-02 NOTE — Patient Instructions (Addendum)
Finish your antibiotics. You may use the abdominal binder as needed.   Continue to pack the area daily as instructed. The best way to do this is to remove the packing, shower letting the warm soapy water run over the area, rinse well, and pat dry. Then repack the area.  May use lotion or vitamin E oil on your dry skin.   Follow up here in 2 weeks.

## 2019-07-02 NOTE — Telephone Encounter (Signed)
Per Dr Hampton Abbot he will send in another refill of his Percocet prescription.

## 2019-07-02 NOTE — Telephone Encounter (Signed)
Patient called and stated that he was just seen here today and forgot to mention if he could get another refill on oxycodone. Please call and advise

## 2019-07-03 LAB — CULTURE, BLOOD (ROUTINE X 2)
Culture: NO GROWTH
Culture: NO GROWTH
Special Requests: ADEQUATE
Special Requests: ADEQUATE

## 2019-07-16 ENCOUNTER — Encounter: Payer: Self-pay | Admitting: Emergency Medicine

## 2019-07-16 ENCOUNTER — Ambulatory Visit: Payer: BC Managed Care – PPO | Attending: Internal Medicine

## 2019-07-16 ENCOUNTER — Ambulatory Visit (INDEPENDENT_AMBULATORY_CARE_PROVIDER_SITE_OTHER): Payer: Self-pay | Admitting: Surgery

## 2019-07-16 ENCOUNTER — Other Ambulatory Visit: Payer: Self-pay

## 2019-07-16 ENCOUNTER — Encounter: Payer: Self-pay | Admitting: Surgery

## 2019-07-16 VITALS — BP 156/82 | HR 93 | Temp 97.6°F | Resp 12 | Ht 67.0 in | Wt 224.2 lb

## 2019-07-16 DIAGNOSIS — Z23 Encounter for immunization: Secondary | ICD-10-CM

## 2019-07-16 DIAGNOSIS — IMO0002 Reserved for concepts with insufficient information to code with codable children: Secondary | ICD-10-CM

## 2019-07-16 DIAGNOSIS — M7981 Nontraumatic hematoma of soft tissue: Secondary | ICD-10-CM

## 2019-07-16 DIAGNOSIS — Z09 Encounter for follow-up examination after completed treatment for conditions other than malignant neoplasm: Secondary | ICD-10-CM

## 2019-07-16 NOTE — Patient Instructions (Signed)
Follow up as needed. Call the office with any questions or concerns.

## 2019-07-16 NOTE — Progress Notes (Signed)
   Covid-19 Vaccination Clinic  Name:  Brandon Mullen    MRN: [id number] DOB: 1975/03/11  07/16/2019  [name] was observed post Covid-19 immunization for 15 minutes without incident. He was provided with Vaccine Information Sheet and instruction to access the V-Safe system.   [name] was instructed to call 911 with any severe reactions post vaccine: Marland Kitchen Difficulty breathing  . Swelling of face and throat  . A fast heartbeat  . A bad rash all over body  . Dizziness and weakness   Immunizations Administered    Name Date Dose VIS Date Route   Pfizer COVID-19 Vaccine 07/16/2019 11:52 AM 0.3 mL 04/25/2018 Intramuscular   Manufacturer: [place]   Lot: [id number]   [place]: [id number]

## 2019-07-16 NOTE — Progress Notes (Signed)
07/16/2019  HPI: Brandon Mullen is a 44 y.o. male s/p colostomy takedown on 3/4.  He developed a significant hematoma at the colostomy site with drainage and open wound that was getting packing dressing changes.  He was also seen in the ER on 4/29 with cellulitis and induration of the inferior portion of his midline incision with no abscess and was given Augmentin.  He presented on 07/02/2019 to the office for follow-up and he was doing well and improving.  He presents today for another follow-up.  He reports that the midline incision is doing very well with no further inflammation or infection and that the colostomy site is healed and he has not done any further packing.  Vital signs: BP (!) 156/82   Pulse 93   Temp 97.6 F (36.4 C)   Resp 12   Ht 5\' 7"  (1.702 m)   Wt 224 lb 3.2 oz (101.7 kg)   SpO2 98%   BMI 35.11 kg/m    Physical Exam: Constitutional: No acute distress  abdomen: Soft, nondistended, nontender to palpation.  Midline incision is well-healed with no evidence of infection.  Left lower quadrant colostomy site wound is healed with only a scab left at the midportion of the wound.  No erythema or cellulitis either.  Assessment/Plan: This is a 44 y.o. male s/p colostomy takedown 05/03/2019 with postoperative hematoma the colostomy site with drainage and open wound, now healed.  -Discussed with patient that everything is going very well now with no signs of infection and the wound has finally healed.  No further packing is needed and can apply it dry gauze dressing as needed.  The scab will fall off on its own as the wound finishes healing. -Follow-up with [place] on an as-needed basis.  We will give him a note for work so he can go back to work next Monday.   Melvyn Neth, [facility]

## 2019-08-29 ENCOUNTER — Other Ambulatory Visit: Payer: Self-pay

## 2019-08-29 ENCOUNTER — Encounter: Payer: Self-pay | Admitting: Surgery

## 2019-08-29 ENCOUNTER — Ambulatory Visit: Payer: BC Managed Care – PPO | Admitting: Surgery

## 2019-08-29 VITALS — BP 127/85 | HR 89 | Temp 97.5°F | Resp 12 | Ht 67.0 in | Wt 221.0 lb

## 2019-08-29 DIAGNOSIS — Z09 Encounter for follow-up examination after completed treatment for conditions other than malignant neoplasm: Secondary | ICD-10-CM | POA: Diagnosis not present

## 2019-08-29 DIAGNOSIS — R1032 Left lower quadrant pain: Secondary | ICD-10-CM
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
## 2019-08-29 NOTE — Patient Instructions (Addendum)
CT scheduled July 9 @ 10:30 at the [facility] in [place]. Please arrive by 10:15 am.   Please pick up your CT prep kit today at the location listed below.   [place] [phone]  Do not eat/drink 4 hours prior.    Please see your follow up appointment listed below.

## 2019-09-04 ENCOUNTER — Ambulatory Visit
Admission: RE | Admit: 2019-09-04 | Discharge: 2019-09-04 | Disposition: A | Discharge status: Home / Self Care | Payer: BC Managed Care – PPO | Source: Ambulatory Visit | Attending: Surgery | Admitting: Surgery

## 2019-09-04 ENCOUNTER — Other Ambulatory Visit: Payer: Self-pay

## 2019-09-04 DIAGNOSIS — R1032 Left lower quadrant pain: Secondary | ICD-10-CM | POA: Diagnosis present

## 2019-09-04 LAB — POCT I-STAT CREATININE: Creatinine, Ser: 0.8 mg/dL (ref 0.61–1.24)

## 2019-09-04 MED ORDER — IOHEXOL 300 MG/ML  SOLN
100.0000 mL | Freq: Once | INTRAMUSCULAR | Status: AC | PRN
  Administered 2019-09-04: 100 mL via INTRAVENOUS

## 2019-09-07 ENCOUNTER — Inpatient Hospital Stay: Admission: RE | Admit: 2019-09-07 | Payer: BC Managed Care – PPO | Source: Ambulatory Visit

## 2019-09-12 ENCOUNTER — Encounter: Payer: Self-pay | Admitting: Surgery

## 2019-09-12 ENCOUNTER — Ambulatory Visit (INDEPENDENT_AMBULATORY_CARE_PROVIDER_SITE_OTHER): Payer: BC Managed Care – PPO | Admitting: Surgery

## 2019-09-12 ENCOUNTER — Other Ambulatory Visit: Payer: Self-pay

## 2019-09-12 VITALS — BP 136/93 | HR 96 | Temp 98.5°F | Resp 12 | Ht 67.0 in | Wt 225.0 lb

## 2019-09-12 DIAGNOSIS — K432 Incisional hernia without obstruction or gangrene: Secondary | ICD-10-CM

## 2019-09-12 NOTE — Patient Instructions (Addendum)
Our surgery scheduler will contact you to schedule your surgery. Please have the blue sheet available when she calls you.   Please call the office if you have any questions or concerns.   Hernia, Adult     A hernia happens when tissue inside your body pushes out through a weak spot in your belly muscles (abdominal wall). This makes a round lump (bulge). The lump may be:  In a scar from surgery that was done in your belly (incisional hernia).  Near your belly button (umbilical hernia).  In your groin (inguinal hernia). Your groin is the area where your leg meets your lower belly (abdomen). This kind of hernia could also be: ? In your scrotum, if you are male. ? In folds of skin around your vagina, if you are male.  In your upper thigh (femoral hernia).  Inside your belly (hiatal hernia). This happens when your stomach slides above the muscle between your belly and your chest (diaphragm). If your hernia is small and it does not cause pain, you may not need treatment. If your hernia is large or it causes pain, you may need surgery. Follow these instructions at home: Activity  Avoid stretching or overusing (straining) the muscles near your hernia. Straining can happen when you: ? Lift something heavy. ? Poop (have a bowel movement).  Do not lift anything that is heavier than 10 lb (4.5 kg), or the limit that you are told, until your doctor says that it is safe.  Use the strength of your legs when you lift something heavy. Do not use only your back muscles to lift. General instructions  Do these things if told by your doctor so you do not have trouble pooping (constipation): ? Drink enough fluid to keep your pee (urine) pale yellow. ? Eat foods that are high in fiber. These include fresh fruits and vegetables, whole grains, and beans. ? Limit foods that are high in fat and processed sugars. These include foods that are fried or sweet. ? Take medicine for trouble pooping.  When  you cough, try to cough gently.  You may try to push your hernia in by very gently pressing on it when you are lying down. Do not try to force the bulge back in if it will not push in easily.  If you are overweight, work with your doctor to lose weight safely.  Do not use any products that have nicotine or tobacco in them. These include cigarettes and e-cigarettes. If you need help quitting, ask your doctor.  If you will be having surgery (hernia repair), watch your hernia for changes in shape, size, or color. Tell your doctor if you see any changes.  Take over-the-counter and prescription medicines only as told by your doctor.  Keep all follow-up visits as told by your doctor. Contact a doctor if:  You get new pain, swelling, or redness near your hernia.  You poop fewer times in a week than normal.  You have trouble pooping.  You have poop (stool) that is more dry than normal.  You have poop that is harder or larger than normal. Get help right away if:  You have a fever.  You have belly pain that gets worse.  You feel sick to your stomach (nauseous).  You throw up (vomit).  Your hernia cannot be pushed in by very gently pressing on it when you are lying down. Do not try to force the bulge back in if it will not push in easily.  Your hernia: ? Changes in shape or size. ? Changes color. ? Feels hard or it hurts when you touch it. These symptoms may represent a serious problem that is an emergency. Do not wait to see if the symptoms will go away. Get medical help right away. Call your local emergency services (911 in the [place] Summary  A hernia happens when tissue inside your body pushes out through a weak spot in the belly muscles. This creates a bulge.  If your hernia is small and it does not hurt, you may not need treatment. If your hernia is large or it hurts, you may need surgery.  If you will be having surgery, watch your hernia for changes in shape, size, or  color. Tell your doctor about any changes. This information is not intended to replace advice given to you by your health care provider. Make sure you discuss any questions you have with your health care provider. Document Revised: 06/08/2018 Document Reviewed: 11/17/2016 Elsevier Patient Education  [place].
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
## 2019-09-14 ENCOUNTER — Telehealth: Payer: Self-pay | Admitting: Surgery

## 2019-09-14 NOTE — Telephone Encounter (Signed)
Outgoing calls is made, please advise patient of Pre-Admission date/time, COVID Testing date and Surgery date.  Surgery Date: 09/25/19 Preadmission Testing Date: 09/18/19 (phone 8a-1p) Covid Testing Date: 09/21/19 - patient advised to go to the [facility] ([place]) between 8a-1p   Also patient needs to call [phone], between 1-3:00pm the day before surgery, to find out what time to arrive for surgery.

## 2019-09-14 NOTE — Telephone Encounter (Signed)
Patient calls back, he is informed of all dates regarding his surgery and voices understanding.   

## 2019-09-18 ENCOUNTER — Other Ambulatory Visit: Payer: Self-pay

## 2019-09-18 ENCOUNTER — Encounter
Admission: RE | Admit: 2019-09-18 | Discharge: 2019-09-18 | Disposition: A | Discharge status: Home / Self Care | Payer: BC Managed Care – PPO | Source: Ambulatory Visit | Attending: Surgery | Admitting: Surgery

## 2019-09-18 NOTE — Patient Instructions (Addendum)
COVID TESTING Date: Friday, July 23 Testing site:  [place] [facility] [address] Thru Hours:  8:50 am - 1:00 pm Once you are tested, you are asked to stay quarantined (avoiding public places) until after your surgery.   Your procedure is scheduled on: Tuesday, July 27 Report to Day Surgery on the 2nd floor of the [facility]. To find out your arrival time, please call [phone] between 1PM - 3PM on: Monday, July 26  REMEMBER: Instructions that are not followed completely may result in serious medical risk, up to and including death; or upon the discretion of your surgeon and anesthesiologist your surgery may need to be rescheduled.  Do not eat food after midnight the night before surgery.  No gum chewing, lozengers or hard candies.  You may however, drink CLEAR liquids up to 2 hours before you are scheduled to arrive for your surgery. Do not drink anything within 2 hours of your scheduled arrival time.  Clear liquids include: - water  - apple juice without pulp - gatorade (not RED) - black coffee or tea (Do NOT add milk or creamers to the coffee or tea) Do NOT drink anything that is not on this list.  TAKE THESE MEDICATIONS THE MORNING OF SURGERY WITH A SIP OF WATER:  1.  Levothyroxine  Stop Anti-inflammatories (NSAIDS) such as Advil, Aleve, Ibuprofen, Motrin, Naproxen, Naprosyn and Aspirin based products such as Excedrin, Goodys Powder, BC Powder. (May take Tylenol or Acetaminophen if needed.)  Stop ANY OVER THE COUNTER supplements until after surgery.  No Alcohol for 24 hours before or after surgery.  No Smoking including e-cigarettes for 24 hours prior to surgery.  No chewable tobacco products for at least 6 hours prior to surgery.  No nicotine patches on the day of surgery.  Do not use any "recreational" drugs for at least a week prior to your surgery.  Please be advised that the combination of cocaine and anesthesia may have negative  outcomes, up to and including death. If you test positive for cocaine, your surgery will be cancelled.  On the morning of surgery brush your teeth with toothpaste and water, you may rinse your mouth with mouthwash if you wish. Do not swallow any toothpaste or mouthwash.  Do not wear jewelry.  Do not wear lotions, powders, or perfumes.   Do not shave 48 hours prior to surgery.   Do not bring valuables to the hospital. [facility] is not responsible for any missing/lost belongings or valuables.   Use CHG Soap as directed on instruction sheet.  Notify your doctor if there is any change in your medical condition (cold, fever, infection).  Wear comfortable clothing (specific to your surgery type) to the hospital.  Plan for stool softeners for home use; pain medications have a tendency to cause constipation. You can also help prevent constipation by eating foods high in fiber such as fruits and vegetables and drinking plenty of fluids as your diet allows.  After surgery, you can help prevent lung complications by doing breathing exercises.  Take deep breaths and cough every 1-2 hours. Your doctor may order a device called an Incentive Spirometer to help you take deep breaths. When coughing or sneezing, hold a pillow firmly against your incision with both hands. This is called "splinting." Doing this helps protect your incision. It also decreases belly discomfort.  If you are being discharged the day of surgery, you will not be allowed to drive home. You will need a  responsible adult (18 years or older) to drive you home and stay with you that night.   If you are taking public transportation, you will need to have a responsible adult (18 years or older) with you. Please confirm with your physician that it is acceptable to use public transportation.   Please call the [facility] at [phone] if you have any questions about these instructions.  Visitation  Policy:  Patients undergoing a surgery or procedure may have one family member or support person with them as long as that person is not COVID-19 positive or experiencing its symptoms.  That person may remain in the waiting area during the procedure.  As a reminder, masks are still required for all [facility] team members, patients and visitors in all [facility] facilities.   Systemwide, no visitors 17 or younger.

## 2019-09-21 ENCOUNTER — Encounter
Admission: RE | Admit: 2019-09-21 | Discharge: 2019-09-21 | Disposition: A | Discharge status: Home / Self Care | Payer: BC Managed Care – PPO | Source: Ambulatory Visit | Attending: Surgery | Admitting: Surgery

## 2019-09-21 ENCOUNTER — Encounter: Payer: Self-pay | Admitting: Urgent Care

## 2019-09-21 ENCOUNTER — Other Ambulatory Visit: Payer: BC Managed Care – PPO

## 2019-09-21 ENCOUNTER — Other Ambulatory Visit: Payer: Self-pay

## 2019-09-21 DIAGNOSIS — Z01818 Encounter for other preprocedural examination: Secondary | ICD-10-CM | POA: Diagnosis present

## 2019-09-21 DIAGNOSIS — K432 Incisional hernia without obstruction or gangrene: Secondary | ICD-10-CM | POA: Insufficient documentation

## 2019-09-21 DIAGNOSIS — I10 Essential (primary) hypertension: Secondary | ICD-10-CM | POA: Insufficient documentation

## 2019-09-21 DIAGNOSIS — Z20822 Contact with and (suspected) exposure to covid-19: Secondary | ICD-10-CM | POA: Diagnosis not present

## 2019-09-21 LAB — BASIC METABOLIC PANEL
Anion gap: 12 (ref 5–15)
BUN: 14 mg/dL (ref 6–20)
CO2: 28 mmol/L (ref 22–32)
Calcium: 9.8 mg/dL (ref 8.9–10.3)
Chloride: 100 mmol/L (ref 98–111)
Creatinine, Ser: 0.84 mg/dL (ref 0.61–1.24)
GFR calc Af Amer: >60 mL/min (ref >60)
GFR calc non Af Amer: >60 mL/min (ref >60)
Glucose, Bld: 111 mg/dL — ABNORMAL HIGH (ref 70–99)
Potassium: 4.9 mmol/L (ref 3.5–5.1)
Sodium: 140 mmol/L (ref 135–145)

## 2019-09-21 LAB — SARS CORONAVIRUS 2 (TAT 6-24 HRS): SARS Coronavirus 2: NEGATIVE

## 2019-09-25 ENCOUNTER — Ambulatory Visit: Payer: BC Managed Care – PPO | Admitting: Anesthesiology

## 2019-09-25 ENCOUNTER — Ambulatory Visit
Admission: RE | Admit: 2019-09-25 | Discharge: 2019-09-25 | Disposition: A | Discharge status: Home / Self Care | Payer: BC Managed Care – PPO | Attending: Surgery | Admitting: Surgery

## 2019-09-25 ENCOUNTER — Encounter: Payer: Self-pay | Admitting: Surgery

## 2019-09-25 ENCOUNTER — Encounter
Admission: RE | Disposition: A | Discharge status: Home / Self Care | Payer: Self-pay | Source: Home / Self Care | Attending: Surgery

## 2019-09-25 ENCOUNTER — Other Ambulatory Visit: Payer: Self-pay

## 2019-09-25 DIAGNOSIS — I10 Essential (primary) hypertension: Secondary | ICD-10-CM | POA: Diagnosis not present

## 2019-09-25 DIAGNOSIS — Z79899 Other long term (current) drug therapy: Secondary | ICD-10-CM | POA: Diagnosis not present

## 2019-09-25 DIAGNOSIS — E039 Hypothyroidism, unspecified: Secondary | ICD-10-CM | POA: Insufficient documentation

## 2019-09-25 DIAGNOSIS — K219 Gastro-esophageal reflux disease without esophagitis: Secondary | ICD-10-CM | POA: Insufficient documentation

## 2019-09-25 DIAGNOSIS — Z9081 Acquired absence of spleen: Secondary | ICD-10-CM | POA: Diagnosis not present

## 2019-09-25 DIAGNOSIS — M19022 Primary osteoarthritis, left elbow: Secondary | ICD-10-CM | POA: Insufficient documentation

## 2019-09-25 DIAGNOSIS — Z885 Allergy status to narcotic agent status: Secondary | ICD-10-CM | POA: Insufficient documentation

## 2019-09-25 DIAGNOSIS — D3502 Benign neoplasm of left adrenal gland: Secondary | ICD-10-CM | POA: Insufficient documentation

## 2019-09-25 DIAGNOSIS — M19021 Primary osteoarthritis, right elbow: Secondary | ICD-10-CM | POA: Diagnosis not present

## 2019-09-25 DIAGNOSIS — K449 Diaphragmatic hernia without obstruction or gangrene: Secondary | ICD-10-CM | POA: Diagnosis not present

## 2019-09-25 DIAGNOSIS — K432 Incisional hernia without obstruction or gangrene: Secondary | ICD-10-CM | POA: Insufficient documentation

## 2019-09-25 DIAGNOSIS — Z9049 Acquired absence of other specified parts of digestive tract: Secondary | ICD-10-CM | POA: Diagnosis not present

## 2019-09-25 DIAGNOSIS — K760 Fatty (change of) liver, not elsewhere classified: Secondary | ICD-10-CM | POA: Diagnosis not present

## 2019-09-25 HISTORY — PX: INSERTION OF MESH: SHX5868

## 2019-09-25 HISTORY — PX: XI ROBOTIC ASSISTED VENTRAL HERNIA: SHX6789

## 2019-09-25 LAB — URINE DRUG SCREEN, QUALITATIVE (ARMC ONLY)
Amphetamines, Ur Screen: NOT DETECTED
Barbiturates, Ur Screen: NOT DETECTED
Benzodiazepine, Ur Scrn: NOT DETECTED
Cannabinoid 50 Ng, Ur ~~LOC~~: NOT DETECTED
Cocaine Metabolite,Ur ~~LOC~~: NOT DETECTED
MDMA (Ecstasy)Ur Screen: NOT DETECTED
Methadone Scn, Ur: NOT DETECTED
Opiate, Ur Screen: NOT DETECTED
Phencyclidine (PCP) Ur S: NOT DETECTED
Tricyclic, Ur Screen: NOT DETECTED

## 2019-09-25 SURGERY — REPAIR, HERNIA, VENTRAL, ROBOT-ASSISTED
Anesthesia: General | Site: Abdomen

## 2019-09-25 MED ORDER — ONDANSETRON HCL 4 MG/2ML IJ SOLN: 4.0000 mg | Freq: Once | INTRAMUSCULAR | Status: DC | PRN

## 2019-09-25 MED ORDER — PROPOFOL 10 MG/ML IV BOLUS
INTRAVENOUS | Status: DC | PRN
  Administered 2019-09-25: 170 mg via INTRAVENOUS

## 2019-09-25 MED ORDER — PHENYLEPHRINE HCL (PRESSORS) 10 MG/ML IV SOLN
INTRAVENOUS | Status: DC | PRN
  Administered 2019-09-25: 100 ug via INTRAVENOUS

## 2019-09-25 MED ORDER — ACETAMINOPHEN 500 MG PO TABS
ORAL_TABLET | ORAL | Status: AC
  Administered 2019-09-25: 1000 mg via ORAL
  Filled 2019-09-25: qty 2

## 2019-09-25 MED ORDER — GABAPENTIN 100 MG PO CAPS
100.0000 mg | ORAL_CAPSULE | Freq: Three times a day (TID) | ORAL | 1 refills | Status: DC
Start: 2019-09-25 — End: 2019-10-24

## 2019-09-25 MED ORDER — LABETALOL HCL 5 MG/ML IV SOLN
INTRAVENOUS | Status: DC | PRN
  Administered 2019-09-25 (×2): 5 mg via INTRAVENOUS

## 2019-09-25 MED ORDER — ONDANSETRON HCL 4 MG/2ML IJ SOLN
INTRAMUSCULAR | Status: DC | PRN
  Administered 2019-09-25: 4 mg via INTRAVENOUS

## 2019-09-25 MED ORDER — BUPIVACAINE LIPOSOME 1.3 % IJ SUSP
INTRAMUSCULAR | Status: AC
  Filled 2019-09-25: qty 20

## 2019-09-25 MED ORDER — FENTANYL CITRATE (PF) 100 MCG/2ML IJ SOLN
INTRAMUSCULAR | Status: AC
  Filled 2019-09-25: qty 2

## 2019-09-25 MED ORDER — IBUPROFEN 600 MG PO TABS
600.0000 mg | ORAL_TABLET | Freq: Three times a day (TID) | ORAL | 1 refills | Status: DC | PRN
Start: 2019-09-25 — End: 2020-08-11

## 2019-09-25 MED ORDER — GABAPENTIN 300 MG PO CAPS
ORAL_CAPSULE | ORAL | Status: AC
  Administered 2019-09-25: 300 mg via ORAL
  Filled 2019-09-25: qty 1

## 2019-09-25 MED ORDER — GABAPENTIN 300 MG PO CAPS: 300.0000 mg | ORAL_CAPSULE | ORAL | Status: AC

## 2019-09-25 MED ORDER — OXYCODONE HCL 5 MG PO TABS
5.0000 mg | ORAL_TABLET | ORAL | 0 refills | Status: DC | PRN
Start: 2019-09-25 — End: 2019-09-28

## 2019-09-25 MED ORDER — CHLORHEXIDINE GLUCONATE CLOTH 2 % EX PADS: 6.0000 | MEDICATED_PAD | Freq: Once | CUTANEOUS | Status: DC

## 2019-09-25 MED ORDER — FAMOTIDINE 20 MG PO TABS
ORAL_TABLET | ORAL | Status: AC
  Administered 2019-09-25: 20 mg via ORAL
  Filled 2019-09-25: qty 1

## 2019-09-25 MED ORDER — DEXAMETHASONE SODIUM PHOSPHATE 10 MG/ML IJ SOLN
INTRAMUSCULAR | Status: DC | PRN
  Administered 2019-09-25: 10 mg via INTRAVENOUS

## 2019-09-25 MED ORDER — SUGAMMADEX SODIUM 200 MG/2ML IV SOLN
INTRAVENOUS | Status: DC | PRN
  Administered 2019-09-25: 250 mg via INTRAVENOUS

## 2019-09-25 MED ORDER — ACETAMINOPHEN 500 MG PO TABS: 1000.0000 mg | ORAL_TABLET | ORAL | Status: AC

## 2019-09-25 MED ORDER — MIDAZOLAM HCL 2 MG/2ML IJ SOLN
INTRAMUSCULAR | Status: DC | PRN
  Administered 2019-09-25: 2 mg via INTRAVENOUS

## 2019-09-25 MED ORDER — FAMOTIDINE 20 MG PO TABS: 20.0000 mg | ORAL_TABLET | Freq: Once | ORAL | Status: AC

## 2019-09-25 MED ORDER — CEFAZOLIN SODIUM-DEXTROSE 2-4 GM/100ML-% IV SOLN
2.0000 g | INTRAVENOUS | Status: AC
  Administered 2019-09-25: 2 g via INTRAVENOUS

## 2019-09-25 MED ORDER — ORAL CARE MOUTH RINSE: 15.0000 mL | Freq: Once | OROMUCOSAL | Status: AC

## 2019-09-25 MED ORDER — PROPOFOL 10 MG/ML IV BOLUS
INTRAVENOUS | Status: AC
  Filled 2019-09-25: qty 20

## 2019-09-25 MED ORDER — BUPIVACAINE-EPINEPHRINE 0.25% -1:200000 IJ SOLN
INTRAMUSCULAR | Status: DC | PRN
  Administered 2019-09-25: 30 mL

## 2019-09-25 MED ORDER — KETOROLAC TROMETHAMINE 30 MG/ML IJ SOLN
INTRAMUSCULAR | Status: DC | PRN
  Administered 2019-09-25: 30 mg via INTRAVENOUS

## 2019-09-25 MED ORDER — ACETAMINOPHEN 10 MG/ML IV SOLN
INTRAVENOUS | Status: AC
  Filled 2019-09-25: qty 100

## 2019-09-25 MED ORDER — MIDAZOLAM HCL 2 MG/2ML IJ SOLN
INTRAMUSCULAR | Status: AC
  Filled 2019-09-25: qty 2

## 2019-09-25 MED ORDER — BUPIVACAINE LIPOSOME 1.3 % IJ SUSP
INTRAMUSCULAR | Status: DC | PRN
  Administered 2019-09-25: 20 mL

## 2019-09-25 MED ORDER — FENTANYL CITRATE (PF) 100 MCG/2ML IJ SOLN
INTRAMUSCULAR | Status: DC | PRN
  Administered 2019-09-25 (×2): 100 ug via INTRAVENOUS
  Administered 2019-09-25: 150 ug via INTRAVENOUS

## 2019-09-25 MED ORDER — BUPIVACAINE-EPINEPHRINE (PF) 0.25% -1:200000 IJ SOLN
INTRAMUSCULAR | Status: AC
  Filled 2019-09-25: qty 30

## 2019-09-25 MED ORDER — CHLORHEXIDINE GLUCONATE 0.12 % MT SOLN: 15.0000 mL | Freq: Once | OROMUCOSAL | Status: AC

## 2019-09-25 MED ORDER — SUGAMMADEX SODIUM 500 MG/5ML IV SOLN
INTRAVENOUS | Status: AC
  Filled 2019-09-25: qty 5

## 2019-09-25 MED ORDER — LABETALOL HCL 5 MG/ML IV SOLN
INTRAVENOUS | Status: AC
  Filled 2019-09-25: qty 4

## 2019-09-25 MED ORDER — CEFAZOLIN SODIUM-DEXTROSE 2-4 GM/100ML-% IV SOLN
INTRAVENOUS | Status: AC
  Filled 2019-09-25: qty 100

## 2019-09-25 MED ORDER — LACTATED RINGERS IV SOLN: INTRAVENOUS | Status: DC

## 2019-09-25 MED ORDER — HYDROMORPHONE HCL 1 MG/ML IJ SOLN
INTRAMUSCULAR | Status: AC
  Filled 2019-09-25: qty 1

## 2019-09-25 MED ORDER — LIDOCAINE HCL (CARDIAC) PF 100 MG/5ML IV SOSY
PREFILLED_SYRINGE | INTRAVENOUS | Status: DC | PRN
  Administered 2019-09-25: 100 mg via INTRAVENOUS

## 2019-09-25 MED ORDER — HYDROMORPHONE HCL 1 MG/ML IJ SOLN
INTRAMUSCULAR | Status: DC | PRN
  Administered 2019-09-25: 1 mg via INTRAVENOUS

## 2019-09-25 MED ORDER — CHLORHEXIDINE GLUCONATE 0.12 % MT SOLN
OROMUCOSAL | Status: AC
  Administered 2019-09-25: 15 mL via OROMUCOSAL
  Filled 2019-09-25: qty 15

## 2019-09-25 MED ORDER — FENTANYL CITRATE (PF) 100 MCG/2ML IJ SOLN: 25.0000 ug | INTRAMUSCULAR | Status: DC | PRN

## 2019-09-25 MED ORDER — FENTANYL CITRATE (PF) 250 MCG/5ML IJ SOLN
INTRAMUSCULAR | Status: AC
  Filled 2019-09-25: qty 5

## 2019-09-25 MED ORDER — ROCURONIUM BROMIDE 100 MG/10ML IV SOLN
INTRAVENOUS | Status: DC | PRN
  Administered 2019-09-25: 50 mg via INTRAVENOUS
  Administered 2019-09-25: 20 mg via INTRAVENOUS
  Administered 2019-09-25: 30 mg via INTRAVENOUS
  Administered 2019-09-25: 20 mg via INTRAVENOUS

## 2019-09-25 MED ORDER — ACETAMINOPHEN 10 MG/ML IV SOLN
INTRAVENOUS | Status: DC | PRN
  Administered 2019-09-25: 1000 mg via INTRAVENOUS

## 2019-09-25 MED ORDER — BUPIVACAINE LIPOSOME 1.3 % IJ SUSP: 20.0000 mL | Freq: Once | INTRAMUSCULAR | Status: DC

## 2019-09-25 SURGICAL SUPPLY — 61 items
"PENCIL ELECTRO HAND CTR " (MISCELLANEOUS) ×2 IMPLANT
ADH SKN CLS APL DERMABOND .7 (GAUZE/BANDAGES/DRESSINGS) ×2
APL PRP STRL LF DISP 70% ISPRP (MISCELLANEOUS) ×2
BLADE SURG SZ11 CARB STEEL (BLADE) ×3 IMPLANT
CANISTER SUCT 1200ML W/VALVE (MISCELLANEOUS) ×3 IMPLANT
CANNULA REDUC XI 12-8 STAPL (CANNULA) ×1
CANNULA REDUCER 12-8 DVNC XI (CANNULA) ×2 IMPLANT
CHLORAPREP W/TINT 26 (MISCELLANEOUS) ×3 IMPLANT
COVER TIP SHEARS 8 DVNC (MISCELLANEOUS) ×2 IMPLANT
COVER TIP SHEARS 8MM DA VINCI (MISCELLANEOUS) ×1
COVER WAND RF STERILE (DRAPES) ×3 IMPLANT
DEFOGGER SCOPE WARMER CLEARIFY (MISCELLANEOUS) ×3 IMPLANT
DERMABOND ADVANCED (GAUZE/BANDAGES/DRESSINGS) ×1
DERMABOND ADVANCED .7 DNX12 (GAUZE/BANDAGES/DRESSINGS) ×2 IMPLANT
DRAPE ARM DVNC X/XI (DISPOSABLE) ×8 IMPLANT
DRAPE COLUMN DVNC XI (DISPOSABLE) ×2 IMPLANT
DRAPE DA VINCI XI ARM (DISPOSABLE) ×4
DRAPE DA VINCI XI COLUMN (DISPOSABLE) ×1
ELECT CAUTERY BLADE 6.4 (BLADE) ×3 IMPLANT
ELECT REM PT RETURN 9FT ADLT (ELECTROSURGICAL) ×3
ELECTRODE REM PT RTRN 9FT ADLT (ELECTROSURGICAL) ×2 IMPLANT
GLOVE SURG SYN 7.0 (GLOVE) ×6 IMPLANT
GLOVE SURG SYN 7.0 PF PI (GLOVE) ×4 IMPLANT
GLOVE SURG SYN 7.5  E (GLOVE) ×2
GLOVE SURG SYN 7.5 E (GLOVE) ×4 IMPLANT
GLOVE SURG SYN 7.5 PF PI (GLOVE) ×4 IMPLANT
GOWN STRL REUS W/ TWL LRG LVL3 (GOWN DISPOSABLE) ×6 IMPLANT
GOWN STRL REUS W/TWL LRG LVL3 (GOWN DISPOSABLE) ×9
GRASPER SUT TROCAR 14GX15 (MISCELLANEOUS) ×3 IMPLANT
IRRIGATION STRYKERFLOW (MISCELLANEOUS) IMPLANT
IRRIGATOR STRYKERFLOW (MISCELLANEOUS)
IV NS 1000ML (IV SOLUTION)
IV NS 1000ML BAXH (IV SOLUTION) IMPLANT
KIT PINK PAD W/HEAD ARE REST (MISCELLANEOUS) ×3
KIT PINK PAD W/HEAD ARM REST (MISCELLANEOUS) ×2 IMPLANT
LABEL OR SOLS (LABEL) ×3 IMPLANT
MESH VENT LT ST 11.4CM CRL (Mesh General) ×1 IMPLANT
NDL INSUFFLATION 14GA 120MM (NEEDLE) ×2 IMPLANT
NEEDLE HYPO 22GX1.5 SAFETY (NEEDLE) ×3 IMPLANT
NEEDLE INSUFFLATION 14GA 120MM (NEEDLE) ×3 IMPLANT
OBTURATOR OPTICAL STANDARD 8MM (TROCAR) ×1
OBTURATOR OPTICAL STND 8 DVNC (TROCAR) ×2
OBTURATOR OPTICALSTD 8 DVNC (TROCAR) ×2 IMPLANT
PACK LAP CHOLECYSTECTOMY (MISCELLANEOUS) ×3 IMPLANT
PENCIL ELECTRO HAND CTR (MISCELLANEOUS) ×3 IMPLANT
POUCH ENDO CATCH 10MM SPEC (MISCELLANEOUS) ×1 IMPLANT
SEAL CANN UNIV 5-8 DVNC XI (MISCELLANEOUS) ×4 IMPLANT
SEAL XI 5MM-8MM UNIVERSAL (MISCELLANEOUS) ×2
SET TUBE SMOKE EVAC HIGH FLOW (TUBING) ×3 IMPLANT
SOLUTION ELECTROLUBE (MISCELLANEOUS) ×3 IMPLANT
SPONGE LAP 18X18 RF (DISPOSABLE) ×3 IMPLANT
STAPLER CANNULA SEAL DVNC XI (STAPLE) ×2 IMPLANT
STAPLER CANNULA SEAL XI (STAPLE) ×1
SUT MNCRL 4-0 (SUTURE) ×3
SUT MNCRL 4-0 27XMFL (SUTURE) ×2
SUT STRATAFIX PDS 30 CT-1 (SUTURE) ×3 IMPLANT
SUT VICRYL 0 AB UR-6 (SUTURE) ×6 IMPLANT
SUT VLOC 90 2/L VL 12 GS22 (SUTURE) ×6 IMPLANT
SUTURE MNCRL 4-0 27XMF (SUTURE) ×2 IMPLANT
TRAY FOLEY SLVR 16FR LF STAT (SET/KITS/TRAYS/PACK) ×3 IMPLANT
TROCAR 130MM GELPORT  DAV (MISCELLANEOUS) ×3 IMPLANT

## 2019-09-25 NOTE — Anesthesia Preprocedure Evaluation (Signed)
Anesthesia Evaluation  Patient identified by MRN, date of birth, ID band Patient awake    Reviewed: Allergy & Precautions, NPO status , Patient's Chart, lab work & pertinent test results  History of Anesthesia Complications Negative for: history of anesthetic complications  Airway Mallampati: II       Dental   Pulmonary neg sleep apnea, neg COPD, Not current smoker, former smoker,           Cardiovascular hypertension, Pt. on medications (-) Past MI and (-) CHF (-) dysrhythmias (-) Valvular Problems/Murmurs     Neuro/Psych neg Seizures    GI/Hepatic Neg liver ROS, GERD  Medicated and Controlled,  Endo/Other  neg diabetesHypothyroidism   Renal/GU negative Renal ROS     Musculoskeletal   Abdominal   Peds  Hematology   Anesthesia Other Findings   Reproductive/Obstetrics                             Anesthesia Physical Anesthesia Plan  ASA: II  Anesthesia Plan: General   Post-op Pain Management:    Induction: Intravenous  PONV Risk Score and Plan: 2 and Ondansetron and Dexamethasone  Airway Management Planned: Oral ETT  Additional Equipment:   Intra-op Plan:   Post-operative Plan:   Informed Consent: I have reviewed the patients History and Physical, chart, labs and discussed the procedure including the risks, benefits and alternatives for the proposed anesthesia with the patient or authorized representative who has indicated his/her understanding and acceptance.       Plan Discussed with:   Anesthesia Plan Comments:         Anesthesia Quick Evaluation

## 2019-09-25 NOTE — Op Note (Signed)
Procedure Date:  09/25/2019  Pre-operative Diagnosis:   Incisional hernia in left lower quadrant  Post-operative Diagnosis:  Incisional hernia in left lower quadrant  Procedure:  Robotic assisted Incisional Hernia Repair with mesh  Surgeon:  Melvyn Neth, MD  Assistant:  Willaim Rayas, PA-S  Anesthesia:  General endotracheal  Estimated Blood Loss:  10 ml  Specimens:  None  Complications:  None  Indications for Procedure:  This is a 44 y.o. male who presents with an incisional hernia at the site of a prior colostomy.  The options of surgery versus observation were reviewed with the patient and/or family. The risks of bleeding, abscess or infection, recurrence of symptoms, potential for an open procedure, injury to surrounding structures, and chronic pain were all discussed with the patient and was willing to proceed.  Description of Procedure: The patient was correctly identified in the preoperative area and brought into the operating room.  The patient was placed supine with VTE prophylaxis in place.  Appropriate time-outs were performed.  Anesthesia was induced and the patient was intubated.  Appropriate antibiotics were infused.  The abdomen was prepped and draped in a sterile fashion. The patient's hernia defect was marked with a marking pen.  A Veress needle was introduced in the right upper quadrant and pneumoperitoneum was obtained with appropriate pressures.  An 8 mm robotic trocar was introduced in the right upper quadrant using Optivue technique without complication.  An additional 8 mm port was introduced in right lateral position and a 12 mm trocar in the right lower quadrant.  The DaVinci platform was docked, the camera was targeted, and the instruments were placed under direct visualization.  Initial inspection showed a significant amount of adhesions.  The omentum was adhered to the abdominal wall and liver, as well as loops of small bowel throughout.  We  proceeded with careful extensive lysis of adhesions using both scissors and cautery.  We had to exchange location of the camera in order to visualize the adhesions better and have better working angles at times.  Once all adhesions were taken down, the patient was noted to have a hernia defect in the left lower quadrant consistent with his CT scan imaging.  He also had diastasis of the infraumbilical portion of the rectus muscle, but there was no hernia defect there.  The hernia defect in the left lower quadrant contained small bowel loops.  These were carefully reduced without complications.  The hernia defect was then closed using 0 Stratafix suture.  Overall, the defect measured 5 cm in length.  Once closed, a 4.5 inch Ventralight ST Echo mesh was placed via the 12 mm port.  The mesh was carefully placed centered over the hernia defect using the positioning system.  The mesh was then sutured in place using 2-0 V-loc sutures.  Towards the end of the suturing, it was noted that the tip of the robotic needle driver had bent and broken off.  The tip of the needle driver was found without incident and taken off the abdominal cavity via an Endocatch bag.  The suture line was completed and the needle was removed without complication.    The DaVinci platform was undocked.  The 12 mm port was removed under direct visualization and the fascial opening was closed using 0 Vicryl and endoclose technique.  50 ml of Exparel solution was infiltrated over the hernia site, the port sites, and the skin.  The remaining ports were removed, and the incisions were closed with 4-0  Monocryl.  The wounds were cleaned and sealed with Dermabond.  The patient was emerged from anesthesia and extubated and brought to the recovery room for further management.  The patient tolerated the procedure well and all counts were correct at the end of the case.   Melvyn Neth, MD

## 2019-09-25 NOTE — Discharge Instructions (Addendum)
Bupivacaine Liposomal Suspension for Injection °What is this medicine? °BUPIVACAINE LIPOSOMAL (bue PIV a kane LIP oh som al) is an anesthetic. It causes loss of feeling in the skin or other tissues. It is used to prevent and to treat pain from some procedures. °This medicine may be used for other purposes; ask your health care provider or pharmacist if you have questions. °COMMON BRAND NAME(S): EXPAREL °What should I tell my health care provider before I take this medicine? °They need to know if you have any of these conditions: °· G6PD deficiency °· heart disease °· kidney disease °· liver disease °· low blood pressure °· lung or breathing disease, like asthma °· an unusual or allergic reaction to bupivacaine, other medicines, foods, dyes, or preservatives °· pregnant or trying to get pregnant °· breast-feeding °How should I use this medicine? °This medicine is for injection into the affected area. It is given by a health care professional in a hospital or clinic setting. °Talk to your pediatrician regarding the use of this medicine in children. Special care may be needed. °Overdosage: If you think you have taken too much of this medicine contact a poison control center or emergency room at once. °NOTE: This medicine is only for you. Do not share this medicine with others. °What if I miss a dose? °This does not apply. °What may interact with this medicine? °This medicine may interact with the following medications: °· acetaminophen °· certain antibiotics like dapsone, nitrofurantoin, aminosalicylic acid, sulfonamides °· certain medicines for seizures like phenobarbital, phenytoin, valproic acid °· chloroquine °· cyclophosphamide °· flutamide °· hydroxyurea °· ifosfamide °· metoclopramide °· nitric oxide °· nitroglycerin °· nitroprusside °· nitrous oxide °· other local anesthetics like lidocaine, pramoxine, tetracaine °· primaquine °· quinine °· rasburicase °· sulfasalazine °This list may not describe all possible  interactions. Give your health care provider a list of all the medicines, herbs, non-prescription drugs, or dietary supplements you use. Also tell them if you smoke, drink alcohol, or use illegal drugs. Some items may interact with your medicine. °What should I watch for while using this medicine? °Your condition will be monitored carefully while you are receiving this medicine. °Be careful to avoid injury while the area is numb, and you are not aware of pain. °What side effects may I notice from receiving this medicine? °Side effects that you should report to your doctor or health care professional as soon as possible: °· allergic reactions like skin rash, itching or hives, swelling of the face, lips, or tongue °· seizures °· signs and symptoms of a dangerous change in heartbeat or heart rhythm like chest pain; dizziness; fast, irregular heartbeat; palpitations; feeling faint or lightheaded; falls; breathing problems °· signs and symptoms of methemoglobinemia such as pale, gray, or blue colored skin; headache; fast heartbeat; shortness of breath; feeling faint or lightheaded, falls; tiredness °Side effects that usually do not require medical attention (report to your doctor or health care professional if they continue or are bothersome): °· anxious °· back pain °· changes in taste °· changes in vision °· constipation °· dizziness °· fever °· nausea, vomiting °This list may not describe all possible side effects. Call your doctor for medical advice about side effects. You may report side effects to FDA at 1-800-FDA-1088. °Where should I keep my medicine? °This drug is given in a hospital or clinic and will not be stored at home. °NOTE: This sheet is a summary. It may not cover all possible information. If you have questions about this   medicine, talk to your doctor, pharmacist, or health care provider. °© 2020 [employer] (2018-11-28 10:48:23) ° ° °AMBULATORY SURGERY  °DISCHARGE INSTRUCTIONS ° ° °1) The  drugs that you were given will stay in your system until tomorrow so for the next 24 hours you should not: ° °A) Drive an automobile °B) Make any legal decisions °C) Drink any alcoholic beverage ° ° °2) You may resume regular meals tomorrow.  Today it is better to start with liquids and gradually work up to solid foods. ° °You may eat anything you prefer, but it is better to start with liquids, then soup and crackers, and gradually work up to solid foods. ° ° °3) Please notify your doctor immediately if you have any unusual bleeding, trouble breathing, redness and pain at the surgery site, drainage, fever, or pain not relieved by medication. ° ° ° °4) Additional Instructions: ° ° ° ° ° ° ° °Please contact your physician with any problems or Same Day Surgery at [phone], Monday through Friday 6 am to 4 pm, or [facility] Main number at [phone]. °

## 2019-09-25 NOTE — Transfer of Care (Signed)
Immediate Anesthesia Transfer of Care Note  Patient: Brandon Mullen  Procedure(s) Performed: XI ROBOTIC ASSISTED VENTRAL HERNIA (Left Abdomen) INSERTION OF MESH (N/A Abdomen)  Patient Location: PACU  Anesthesia Type:General  Level of Consciousness: sedated  Airway & Oxygen Therapy: Patient Spontanous Breathing and Patient connected to face mask oxygen  Post-op Assessment: Report given to RN and Post -op Vital signs reviewed and stable  Post vital signs: Reviewed and stable  Last Vitals:  Vitals Value Taken Time  BP 126/75 09/25/19 1619  Temp    Pulse 103 09/25/19 1623  Resp 17 09/25/19 1623  SpO2 92 % 09/25/19 1623    Last Pain:  Vitals:   09/25/19 0956  TempSrc: Tympanic  PainSc: 0-No pain         Complications: No complications documented.

## 2019-09-25 NOTE — Anesthesia Procedure Notes (Signed)
Procedure Name: Intubation Date/Time: 09/25/2019 11:49 AM Performed by: Genevie Ann, CRNA Pre-anesthesia Checklist: Patient identified, Emergency Drugs available, Suction available, Patient being monitored and Timeout performed Patient Re-evaluated:Patient Re-evaluated prior to induction Oxygen Delivery Method: Circle system utilized Preoxygenation: Pre-oxygenation with 100% oxygen Induction Type: IV induction Ventilation: Mask ventilation without difficulty Laryngoscope Size: McGraph and 4 Grade View: Grade I Tube type: Oral Tube size: 7.0 mm Number of attempts: 1 Airway Equipment and Method: Stylet Placement Confirmation: ETT inserted through vocal cords under direct vision,  positive ETCO2 and breath sounds checked- equal and bilateral Secured at: 22 cm Dental Injury: Teeth and Oropharynx as per pre-operative assessment

## 2019-09-25 NOTE — Interval H&P Note (Signed)
History and Physical Interval Note:  09/25/2019 11:16 AM  Brandon Mullen  has presented today for surgery, with the diagnosis of Incisional hernia.  The various methods of treatment have been discussed with the patient and family. After consideration of risks, benefits and other options for treatment, the patient has consented to  Procedure(s): XI ROBOTIC [place] (Left) as a surgical intervention.  The patient's history has been reviewed, patient examined, no change in status, stable for surgery.  I have reviewed the patient's chart and labs.  Questions were answered to the patient's satisfaction.     [name]

## 2019-09-25 NOTE — Anesthesia Procedure Notes (Signed)
Performed by: Isaiyah Feldhaus M, CRNA       

## 2019-09-25 NOTE — Anesthesia Postprocedure Evaluation (Signed)
Anesthesia Post Note  Patient: Brandon Mullen  Procedure(s) Performed: XI ROBOTIC ASSISTED VENTRAL HERNIA (Left Abdomen) INSERTION OF MESH (N/A Abdomen)  Patient location during evaluation: PACU Anesthesia Type: General Level of consciousness: awake and alert Pain management: pain level controlled Vital Signs Assessment: post-procedure vital signs reviewed and stable Respiratory status: spontaneous breathing, nonlabored ventilation, respiratory function stable and patient connected to nasal cannula oxygen Cardiovascular status: blood pressure returned to baseline and stable Postop Assessment: no apparent nausea or vomiting Anesthetic complications: no   No complications documented.   Last Vitals:  Vitals:   09/25/19 1639 09/25/19 1649  BP:  (!) 127/62  Pulse: 102 103  Resp: 14 15  Temp:    SpO2: 94% 94%    Last Pain:  Vitals:   09/25/19 1649  TempSrc:   PainSc: 0-No pain                 Arita Miss

## 2019-09-26 ENCOUNTER — Encounter: Payer: Self-pay | Admitting: Surgery

## 2019-09-28 ENCOUNTER — Encounter: Payer: Self-pay | Admitting: Surgery

## 2019-09-28 ENCOUNTER — Other Ambulatory Visit: Payer: Self-pay | Admitting: Surgery

## 2019-09-28 ENCOUNTER — Other Ambulatory Visit (INDEPENDENT_AMBULATORY_CARE_PROVIDER_SITE_OTHER): Payer: Self-pay | Admitting: Surgery

## 2019-09-28 MED ORDER — CYCLOBENZAPRINE HCL 5 MG PO TABS: 5.0000 mg | ORAL_TABLET | Freq: Three times a day (TID) | ORAL | 1 refills | Status: DC | PRN

## 2019-09-28 MED ORDER — OXYCODONE HCL 5 MG PO TABS: 5.0000 mg | ORAL_TABLET | ORAL | 0 refills | Status: DC | PRN

## 2019-10-05 ENCOUNTER — Other Ambulatory Visit: Payer: Self-pay | Admitting: Surgery

## 2019-10-05 ENCOUNTER — Encounter: Payer: Self-pay | Admitting: Surgery

## 2019-10-05 MED ORDER — OXYCODONE HCL 5 MG PO TABS: 5.0000 mg | ORAL_TABLET | Freq: Four times a day (QID) | ORAL | 0 refills | Status: DC | PRN

## 2019-10-05 NOTE — Telephone Encounter (Signed)
Prescription sent to pharmacy by Dr Hampton Abbot. Patient notified and voiced understanding.

## 2019-10-10 ENCOUNTER — Telehealth: Payer: Self-pay | Admitting: *Deleted

## 2019-10-10 ENCOUNTER — Other Ambulatory Visit: Payer: Self-pay

## 2019-10-10 ENCOUNTER — Other Ambulatory Visit: Payer: Self-pay | Admitting: Surgery

## 2019-10-10 ENCOUNTER — Ambulatory Visit (INDEPENDENT_AMBULATORY_CARE_PROVIDER_SITE_OTHER): Payer: Self-pay | Admitting: Surgery

## 2019-10-10 ENCOUNTER — Encounter: Payer: Self-pay | Admitting: Surgery

## 2019-10-10 VITALS — BP 143/109 | HR 106 | Temp 98.6°F | Resp 12 | Ht 67.0 in | Wt 227.0 lb

## 2019-10-10 DIAGNOSIS — Z09 Encounter for follow-up examination after completed treatment for conditions other than malignant neoplasm: Secondary | ICD-10-CM

## 2019-10-10 DIAGNOSIS — K432 Incisional hernia without obstruction or gangrene: Secondary | ICD-10-CM

## 2019-10-10 MED ORDER — OXYCODONE HCL 5 MG PO TABS: 5.0000 mg | ORAL_TABLET | Freq: Four times a day (QID) | ORAL | 0 refills | Status: DC | PRN

## 2019-10-10 MED ORDER — LIDOCAINE 5 % EX PTCH: 1.0000 | MEDICATED_PATCH | CUTANEOUS | 0 refills | Status: DC

## 2019-10-10 NOTE — Telephone Encounter (Signed)
Faxed FMLA to [employer] [phone]

## 2019-10-10 NOTE — Patient Instructions (Addendum)
Please pick up your medication at the pharmacy today. Please see your follow up appointment listed below.     GENERAL POST-OPERATIVE PATIENT INSTRUCTIONS   WOUND CARE INSTRUCTIONS:  Keep a dry clean dressing on the wound if there is drainage. The initial bandage may be removed after 24 hours.  Once the wound has quit draining you may leave it open to air.  If clothing rubs against the wound or causes irritation and the wound is not draining you may cover it with a dry dressing during the daytime.  Try to keep the wound dry and avoid ointments on the wound unless directed to do so.  If the wound becomes bright red and painful or starts to drain infected material that is not clear, please contact your physician immediately.  If the wound is mildly pink and has a thick firm ridge underneath it, this is normal, and is referred to as a healing ridge.  This will resolve over the next 4-6 weeks.  BATHING: You may shower if you have been informed of this by your surgeon. However, Please do not submerge in a tub, hot tub, or pool until incisions are completely sealed or have been told by your surgeon that you may do so.  DIET:  You may eat any foods that you can tolerate.  It is a good idea to eat a high fiber diet and take in plenty of fluids to prevent constipation.  If you do become constipated you may want to take a mild laxative or take ducolax tablets on a daily basis until your bowel habits are regular.  Constipation can be very uncomfortable, along with straining, after recent surgery.  ACTIVITY:  You are encouraged to cough and deep breath or use your incentive spirometer if you were given one, every 15-30 minutes when awake.  This will help prevent respiratory complications and low grade fevers post-operatively if you had a general anesthetic.  You may want to hug a pillow when coughing and sneezing to add additional support to the surgical area, if you had abdominal or chest surgery, which will  decrease pain during these times.  You are encouraged to walk and engage in light activity for the next two weeks.  You should not lift more than 20 pounds, until 12/07/19  as it could put you at increased risk for complications.  Twenty pounds is roughly equivalent to a plastic bag of groceries. At that time- Listen to your body when lifting, if you have pain when lifting, stop and then try again in a few days. Soreness after doing exercises or activities of daily living is normal as you get back in to your normal routine.  MEDICATIONS:  Try to take narcotic medications and anti-inflammatory medications, such as tylenol, ibuprofen, naprosyn, etc., with food.  This will minimize stomach upset from the medication.  Should you develop nausea and vomiting from the pain medication, or develop a rash, please discontinue the medication and contact your physician.  You should not drive, make important decisions, or operate machinery when taking narcotic pain medication.  SUNBLOCK Use sun block to incision area over the next year if this area will be exposed to sun. This helps decrease scarring and will allow you avoid a permanent darkened area over your incision.  QUESTIONS:  Please feel free to call our office if you have any questions, and we will be glad to assist you. [phone]

## 2019-10-10 NOTE — Progress Notes (Signed)
10/10/2019  HPI: Brandon Mullen is a 44 y.o. male s/p robotic assisted incisional hernia repair with mesh on 09/25/19 for hernia at the prior colostomy site.  He presents today for follow up.  We have refilled his oxycodone prescription twice due to ongoing pain.  He does take Flexeril and Neurontin at night time to help with pain, but only at night because they make him very drowsy.  The pain overall is slowly improving.  There' no more tenderness over the incisions and now it's mostly focused on the hernia site.  He feels it's tight, and sometimes there's shooting pain from his left side to the hernia site.  Denies any bulging at the hernia site or issues with the incisions.  Vital signs: BP (!) 143/109   Pulse (!) 106   Temp 98.6 F (37 C) (Oral)   Resp 12   Ht 5\' 7"  (1.702 m)   Wt 227 lb (103 kg)   SpO2 96%   BMI 35.55 kg/m    Physical Exam: Constitutional: No acute distress Abdomen:  Soft, obese, non-distended, tender to palpation over the prior colostomy hernia site.  The right sided incisions are clean, dry, intact, healing well.    Assessment/Plan: This is a 44 y.o. male s/p robotic assisted incisional hernia repair with mesh.  --Discussed with the patient that the bulk of his pain currently is likely from the sutures used to close his hernia defect.  Unfortunately the sutures can grab muscle tissue and perhaps peripheral nerves that are causing irritation.  This should improve with time as the suture dissolves, and the swelling from the surgery goes down.   --Discussed with him that we can given him a prescription for Lidocaine patch to see if that works better for pain without making him drowsy.  Will also refill his Oxycodone, as he's almost out.  He is taking it as instructed.  He has had pain with his previous surgeries, so this is not out of the ordinary. --He still has 4 more weeks of activity restriction. --Follow up in two weeks to check on his progress and  symptoms.   Melvyn Neth, [facility]

## 2019-10-14 ENCOUNTER — Other Ambulatory Visit: Payer: Self-pay | Admitting: Surgery

## 2019-10-15 ENCOUNTER — Other Ambulatory Visit: Payer: Self-pay | Admitting: Surgery

## 2019-10-15 MED ORDER — OXYCODONE HCL 5 MG PO TABS: 5.0000 mg | ORAL_TABLET | Freq: Four times a day (QID) | ORAL | 0 refills | Status: DC | PRN

## 2019-10-24 ENCOUNTER — Encounter: Payer: Self-pay | Admitting: Surgery

## 2019-10-24 ENCOUNTER — Other Ambulatory Visit: Payer: Self-pay

## 2019-10-24 ENCOUNTER — Ambulatory Visit (INDEPENDENT_AMBULATORY_CARE_PROVIDER_SITE_OTHER): Payer: BC Managed Care – PPO | Admitting: Surgery

## 2019-10-24 VITALS — BP 176/94 | HR 105 | Temp 98.3°F | Ht 67.0 in | Wt 227.0 lb

## 2019-10-24 DIAGNOSIS — K432 Incisional hernia without obstruction or gangrene: Secondary | ICD-10-CM

## 2019-10-24 DIAGNOSIS — Z09 Encounter for follow-up examination after completed treatment for conditions other than malignant neoplasm: Secondary | ICD-10-CM

## 2019-10-24 MED ORDER — GABAPENTIN 100 MG PO CAPS: 100.0000 mg | ORAL_CAPSULE | Freq: Three times a day (TID) | ORAL | 1 refills | Status: DC

## 2019-10-24 MED ORDER — OXYCODONE HCL 5 MG PO TABS: 5.0000 mg | ORAL_TABLET | Freq: Four times a day (QID) | ORAL | 0 refills | Status: DC | PRN

## 2019-10-24 NOTE — Patient Instructions (Addendum)
Continue with your lifting restrictions. No lifting over 15-20 pounds for at least 2 more weeks.   We will refill your Gabapentin and Oxycodone prescriptions. You may use ice to the area several times a day for the next 3 days for comfort.  Continue with the Lidocaine patches.  Follow-up with our office as needed.  Please call and ask to speak with a nurse if you develop questions or concerns.

## 2019-10-24 NOTE — Progress Notes (Signed)
10/24/2019  HPI: Brandon Mullen is a 44 y.o. male s/p robotic assisted incisional hernia repair on 09/25/19.  Patient was last seen on 8/11 and has had his oxycodone refilled prior times due to post-op pain.  Has been using a combination of Oxycodone, Gabapentin, and Lidocaine patches.  More recently, his pain had been improving and had been requiring less pain medication.  However, yesterday he did more strenuous activity by mistake, helping his dad lift a lawnmower, and immediately after felt worse pain again in the LLQ where the incisional hernia is located.  Denies any fevers, chills, nausea, vomiting.  Denies any bulging sensation.  Vital signs: BP (!) 176/94   Pulse (!) 105   Temp 98.3 F (36.8 C)   Ht 5\' 7"  (1.702 m)   Wt 227 lb (103 kg)   SpO2 97%   BMI 35.55 kg/m    Physical Exam: Constitutional:  No acute distress.   Abdomen:  Soft, obese, non-distended, with tenderness to palpation just inferior and lateral to the prior colostomy site, which is the site of the incisional hernia.  The site itself has an area of firmness underneath the scar which is consistent with scar tissue.  However, I do not feel any bulging or hernia defect.  Assessment/Plan: This is a 44 y.o. male s/p robotic assisted incisional hernia repair.  --The patient did heavy lifting by mistake, and he's having pain now in the incisional hernia site.  At this point I am unable to detect a hernia recurrence.  Hopefully the repair with suture and mesh placement is still intact.  At this point, will treat symptomatically and will refill his Oxycodone and Gabapentin prescriptions.   --Still has two more weeks of no heavy lifting or pushing. --Since he otherwise had been recovering much better and his pain had significantly improved, discussed with him the option of close follow up in two weeks vs as needed.  He has elected to call us if anything worsens or his pain is not improving.   --Follow up as needed.   Melvyn Neth, [facility]

## 2019-10-30 ENCOUNTER — Telehealth: Payer: Self-pay | Admitting: Surgery

## 2019-10-30 ENCOUNTER — Other Ambulatory Visit: Payer: Self-pay | Admitting: Surgery

## 2019-10-30 MED ORDER — OXYCODONE HCL 5 MG PO TABS: 5.0000 mg | ORAL_TABLET | Freq: Four times a day (QID) | ORAL | 0 refills | Status: DC | PRN

## 2019-10-30 NOTE — Telephone Encounter (Signed)
Spoke with the patient and he is still having some pain and discomfort from lifting a lawnmower. He reports that it has gotten a little better. He is requesting a refill of his Oxycodone. He also is asking if he needs to be off of work for an additional week because of this. He current return to work date is 11/05/19. I will send a message to the provider about this.

## 2019-10-30 NOTE — Telephone Encounter (Signed)
Patient is calling and is asking if one of the nurses could give him a call. Please call patient and advise.

## 2019-12-24 ENCOUNTER — Other Ambulatory Visit: Payer: Self-pay

## 2019-12-24 ENCOUNTER — Encounter: Payer: Self-pay | Admitting: Surgery

## 2019-12-24 ENCOUNTER — Ambulatory Visit (INDEPENDENT_AMBULATORY_CARE_PROVIDER_SITE_OTHER): Payer: BC Managed Care – PPO | Admitting: Surgery

## 2019-12-24 VITALS — BP 135/85 | HR 88 | Ht 67.0 in | Wt 219.0 lb

## 2019-12-24 DIAGNOSIS — K432 Incisional hernia without obstruction or gangrene: Secondary | ICD-10-CM

## 2019-12-24 NOTE — Patient Instructions (Addendum)
You may wear an abdominal binder to support your hernia.   We will see you back here in 3 months. We will send you a letter about this appointment.   Call us if you start to develop a significant increase in pain or other symptoms of your hernia worsening.   You may add fiber to your diet, just be sure to increase your fluid intake.
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
## 2020-03-24 ENCOUNTER — Other Ambulatory Visit: Payer: Self-pay

## 2020-03-24 ENCOUNTER — Ambulatory Visit (INDEPENDENT_AMBULATORY_CARE_PROVIDER_SITE_OTHER): Payer: BC Managed Care – PPO | Admitting: Surgery

## 2020-03-24 ENCOUNTER — Encounter: Payer: Self-pay | Admitting: Surgery

## 2020-03-24 VITALS — BP 125/75 | HR 77 | Temp 98.1°F | Ht 67.0 in | Wt 222.2 lb

## 2020-03-24 DIAGNOSIS — K432 Incisional hernia without obstruction or gangrene: Secondary | ICD-10-CM | POA: Diagnosis not present
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
## 2020-03-24 NOTE — Patient Instructions (Signed)
Dr Hampton Abbot discussed with patient to keep an eye on the area. If the area of concern seems have gotten larger, worsen. Patient advise to give our office a call to get scheduled for a follow up appointment to discuss next steps.  Dr Hampton Abbot discussed the surgery treatment, risk factors and recovery phase with patient at today's visit. Follow-up with our office as needed. Please call and ask to speak with a nurse if you develop questions or concerns.  Hernia, Adult     A hernia is the bulging of an organ or tissue through a weak spot in the muscles of the abdomen (abdominal wall). Hernias develop most often near the belly button (navel) or the area where the leg meets the lower abdomen (groin). Common types of hernias include:  Incisional hernia. This type bulges through a scar from an abdominal surgery.  Umbilical hernia. This type develops near the navel.  Inguinal hernia. This type develops in the groin or scrotum.  Femoral hernia. This type develops under the groin, in the upper thigh area.  Hiatal hernia. This type occurs when part of the stomach slides above the muscle that separates the abdomen from the chest (diaphragm). What are the causes? This condition may be caused by:  Heavy lifting.  Coughing over a long period of time.  Straining to have a bowel movement. Constipation can lead to straining.  An incision made during an abdominal surgery.  A physical problem that is present at birth (congenital defect).  Being overweight or obese.  Smoking.  Excess fluid in the abdomen.  Undescended testicles in males. What are the signs or symptoms? The main symptom is a skin-colored, rounded bulge in the area of the hernia. However, a bulge may not always be present. It may grow bigger or be more visible when you cough or strain (such as when lifting something heavy). A hernia that can be pushed back into the area (is reducible) rarely causes pain. A hernia that cannot be  pushed back into the area (is incarcerated) may lose its blood supply (become strangulated). A hernia that is incarcerated may cause:  Pain.  Fever.  Nausea and vomiting.  Swelling.  Constipation. How is this diagnosed? A hernia may be diagnosed based on:  Your symptoms and medical history.  A physical exam. Your health care provider may ask you to cough or move in certain ways to see if the hernia becomes visible.  Imaging tests, such as: ? X-rays. ? Ultrasound. ? CT scan. How is this treated? A hernia that is small and painless may not need to be treated. A hernia that is large or painful may be treated with surgery. Inguinal hernias may be treated with surgery to prevent incarceration or strangulation. Strangulated hernias are always treated with surgery because a lack of blood supply to the trapped organ or tissue can cause it to die. Surgery to treat a hernia involves pushing the bulge back into place and repairing the weak area of the muscle or abdominal wall. Follow these instructions at home: Activity  Avoid straining.  Do not lift anything that is heavier than 10 lb (4.5 kg), or the limit that you are told, until your health care provider says that it is safe.  When lifting heavy objects, lift with your leg muscles, not your back muscles. Preventing constipation  Take actions to prevent constipation. Constipation leads to straining with bowel movements, which can make a hernia worse or cause a hernia repair to break down. Your  health care provider may recommend that you: ? Drink enough fluid to keep your urine pale yellow. ? Eat foods that are high in fiber, such as fresh fruits and vegetables, whole grains, and beans. ? Limit foods that are high in fat and processed sugars, such as fried or sweet foods. ? Take an over-the-counter or prescription medicine for constipation. General instructions  When coughing, try to cough gently.  You may try to push the hernia  back in place by very gently pressing on it while lying down. Do not try to force the bulge back in if it will not push in easily.  If you are overweight, work with your health care provider to lose weight safely.  Do not use any products that contain nicotine or tobacco, such as cigarettes and e-cigarettes. If you need help quitting, ask your health care provider.  If you are scheduled for hernia repair, watch your hernia for any changes in shape, size, or color. Tell your health care provider about any changes or new symptoms.  Take over-the-counter and prescription medicines only as told by your health care provider.  Keep all follow-up visits as told by your health care provider. This is important. Contact a health care provider if:  You develop new pain, swelling, or redness around your hernia.  You have signs of constipation, such as: ? Fewer bowel movements in a week than normal. ? Difficulty having a bowel movement. ? Stools that are dry, hard, or larger than normal. Get help right away if:  You have a fever.  You have abdomen pain that gets worse.  You feel nauseous or you vomit.  You cannot push the hernia back in place by very gently pressing on it while lying down. Do not try to force the bulge back in if it will not push in easily.  The hernia: ? Changes in shape, size, or color. ? Feels hard or tender. These symptoms may represent a serious problem that is an emergency. Do not wait to see if the symptoms will go away. Get medical help right away. Call your local emergency services (911 in the [place] Summary  A hernia is the bulging of an organ or tissue through a weak spot in the muscles of the abdomen (abdominal wall).  The main symptom is a skin-colored, rounded lump (bulge) in the hernia area. However, a bulge may not always be present. It may grow bigger or more visible when you cough or strain (such as when having a bowel movement).  A hernia that is small  and painless may not need to be treated. A hernia that is large or painful may be treated with surgery.  Surgery to treat a hernia involves pushing the bulge back into place and repairing the weak part of the abdomen. This information is not intended to replace advice given to you by your health care provider. Make sure you discuss any questions you have with your health care provider. Document Revised: 06/08/2018 Document Reviewed: 11/17/2016 Elsevier Patient Education  [place].

## 2020-07-21 ENCOUNTER — Other Ambulatory Visit: Payer: Self-pay

## 2020-07-21 ENCOUNTER — Encounter: Payer: Self-pay | Admitting: Surgery

## 2020-07-21 ENCOUNTER — Ambulatory Visit: Payer: BC Managed Care – PPO | Admitting: Surgery

## 2020-07-21 VITALS — BP 129/86 | HR 73 | Temp 98.3°F | Ht 67.0 in | Wt 216.0 lb

## 2020-07-21 DIAGNOSIS — K432 Incisional hernia without obstruction or gangrene: Secondary | ICD-10-CM | POA: Diagnosis not present

## 2020-07-21 NOTE — Patient Instructions (Addendum)
Please go to [facility]patient [facility] to pick up the prep kit and instructions. Do not eat or drink 4 hours prior to having this scan. Please see your follow up appointment listed below.   [facility] [address] [place]   Umbilical Hernia, Adult  A hernia is a bulge of tissue that pushes through an opening between muscles. An umbilical hernia happens in the abdomen, near the belly button (umbilicus). The hernia may contain tissues from the small intestine, large intestine, or fatty tissue covering the intestines (omentum). Umbilical hernias in adults tend to get worse over time, and they require surgical treatment. There are several types of umbilical hernias. You may have:  A hernia located just above or below the umbilicus (indirect hernia). This is the most common type of umbilical hernia in adults.  A hernia that forms through an opening formed by the umbilicus (direct hernia).  A hernia that comes and goes (reducible hernia). A reducible hernia may be visible only when you strain, lift something heavy, or cough. This type of hernia can be pushed back into the abdomen (reduced).  A hernia that traps abdominal tissue inside the hernia (incarcerated hernia). This type of hernia cannot be reduced.  A hernia that cuts off blood flow to the tissues inside the hernia (strangulated hernia). The tissues can start to die if this happens. This type of hernia requires emergency treatment. What are the causes? An umbilical hernia happens when tissue inside the abdomen presses on a weak area of the abdominal muscles. What increases the risk? You may have a greater risk of this condition if you:  Are obese.  Have had several pregnancies.  Have a buildup of fluid inside your abdomen (ascites).  Have had surgery that weakens the abdominal muscles. What are the signs or symptoms? The main symptom of this condition is a painless bulge at or near the belly button. A reducible  hernia may be visible only when you strain, lift something heavy, or cough. Other symptoms may include:  Dull pain.  A feeling of pressure. Symptoms of a strangulated hernia may include:  Pain that gets increasingly worse.  Nausea and vomiting.  Pain when pressing on the hernia.  Skin over the hernia becoming red or purple.  Constipation.  Blood in the stool. How is this diagnosed? This condition may be diagnosed based on:  A physical exam. You may be asked to cough or strain while standing. These actions increase the pressure inside your abdomen and force the hernia through the opening in your muscles. Your health care provider may try to reduce the hernia by pressing on it.  Your symptoms and medical history. How is this treated? Surgery is the only treatment for an umbilical hernia. Surgery for a strangulated hernia is done as soon as possible. If you have a small hernia that is not incarcerated, you may need to lose weight before having surgery. Follow these instructions at home:  Lose weight, if told by your health care provider.  Do not try to push the hernia back in.  Watch your hernia for any changes in color or size. Tell your health care provider if any changes occur.  You may need to avoid activities that increase pressure on your hernia.  Do not lift anything that is heavier than 10 lb (4.5 kg) until your health care provider says that this is safe.  Take over-the-counter and prescription medicines only as told by your health care provider.  Keep all follow-up  visits as told by your health care provider. This is important. Contact a health care provider if:  Your hernia gets larger.  Your hernia becomes painful. Get help right away if:  You develop sudden, severe pain near the area of your hernia.  You have pain as well as nausea or vomiting.  You have pain and the skin over your hernia changes color.  You develop a fever. This information is not  intended to replace advice given to you by your health care provider. Make sure you discuss any questions you have with your health care provider. Document Revised: 03/30/2017 Document Reviewed: 08/16/2016 Elsevier Patient Education  [place].
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
## 2020-08-04 ENCOUNTER — Ambulatory Visit
Admission: RE | Admit: 2020-08-04 | Discharge: 2020-08-04 | Disposition: A | Discharge status: Home / Self Care | Payer: BC Managed Care – PPO | Source: Ambulatory Visit | Attending: Surgery | Admitting: Surgery

## 2020-08-04 ENCOUNTER — Telehealth: Payer: Self-pay

## 2020-08-04 ENCOUNTER — Other Ambulatory Visit: Payer: Self-pay

## 2020-08-04 DIAGNOSIS — K432 Incisional hernia without obstruction or gangrene: Secondary | ICD-10-CM | POA: Insufficient documentation

## 2020-08-04 NOTE — Telephone Encounter (Signed)
Patient notified CT results-keep scheduled appointment.

## 2020-08-11 ENCOUNTER — Encounter: Payer: Self-pay | Admitting: Surgery

## 2020-08-11 ENCOUNTER — Other Ambulatory Visit: Payer: Self-pay

## 2020-08-11 ENCOUNTER — Ambulatory Visit: Payer: BC Managed Care – PPO | Admitting: Surgery

## 2020-08-11 VITALS — BP 142/91 | HR 75 | Temp 98.7°F | Ht 67.0 in | Wt 221.8 lb

## 2020-08-11 DIAGNOSIS — K432 Incisional hernia without obstruction or gangrene: Secondary | ICD-10-CM | POA: Diagnosis not present
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
## 2020-08-11 NOTE — Patient Instructions (Addendum)
Please see your follow up appointment listed below.    If you have any concerns or questions, please feel free to call our office.    Ventral Hernia  A ventral hernia is a bulge of tissue from inside the abdomen that pushes through a weak area of the muscles that form the front wall of the abdomen. The tissues inside the abdomen are inside a sac (peritoneum). These tissues include the small intestine, large intestine, and the fatty tissue that covers the intestines (omentum). Sometimes, the bulge that forms a hernia contains intestines. Other herniascontain only fat. Ventral hernias do not go away without surgical treatment. There are several types of ventral hernias. You may have: A hernia at an incision site from previous abdominal surgery (incisional hernia). A hernia just above the belly button (epigastric hernia), or at the belly button (umbilical hernia). These types of hernias can develop from heavy lifting or straining. A hernia that comes and goes (reducible hernia). It may be visible only when you lift or strain. This type of hernia can be pushed back into the abdomen (reduced). A hernia that traps abdominal tissue inside the hernia (incarcerated hernia). This type of hernia does not reduce. A hernia that cuts off blood flow to the tissues inside the hernia (strangulated hernia). The tissues can start to die if this happens. This is a very painful bulge that cannot be reduced. A strangulated hernia is a medical emergency. What are the causes? This condition is caused by abdominal tissue putting pressure on an area ofweakness in the abdominal muscles. What increases the risk? The following factors may make you more likely to develop this condition: Being age 2 or older. Being overweight or obese. Having had previous abdominal surgery, especially if there was an infection after surgery. Having had an injury to the abdominal wall. Frequently lifting or pushing heavy objects. Having  had several pregnancies. Having a buildup of fluid inside the abdomen (ascites). Straining to have a bowel movement or to urinate. Having frequent coughing episodes. What are the signs or symptoms? The only symptom of a ventral hernia may be a painless bulge in the abdomen. A reducible hernia may be visible only when you strain, cough, or lift. Other symptoms may include: Dull pain. A feeling of pressure. Signs and symptoms of a strangulated hernia may include: Increasing pain. Nausea and vomiting. Pain when pressing on the hernia. The skin over the hernia turning red or purple. Constipation. Blood in the stool (feces). How is this diagnosed? This condition may be diagnosed based on: Your symptoms. Your medical history. A physical exam. You may be asked to cough or strain while standing. These actions increase the pressure inside your abdomen and force the hernia through the opening in your muscles. Your health care provider may try to reduce the hernia by gently pushing the hernia back in. Imaging studies, such as an ultrasound or CT scan. How is this treated? This condition is treated with surgery. If you have a strangulated hernia, surgery is done as soon as possible. If your hernia is small and notincarcerated, you may be asked to lose some weight before surgery. Follow these instructions at home: Follow instructions from your health care provider about eating or drinking restrictions. If you are overweight, your health care provider may recommend that you increase your activity level and eat a healthier diet. Do not lift anything that is heavier than 10 lb (4.5 kg), or the limit that you are told, until your health care  provider says that it is safe. Return to your normal activities as told by your health care provider. Ask your health care provider what activities are safe for you. You may need to avoid activities that increase pressure on your hernia. Take over-the-counter and  prescription medicines only as told by your health care provider. Keep all follow-up visits. This is important. Contact a health care provider if: Your hernia gets larger. Your hernia becomes painful. Get help right away if: Your hernia becomes increasingly painful. You have pain along with any of the following: Changes in skin color in the area of the hernia. Nausea. Vomiting. Fever. These symptoms may represent a serious problem that is an emergency. Do not wait to see if the symptoms will go away. Get medical help right away. Call your local emergency services (911 in the [place] Do not drive yourself to the hospital. Summary A ventral hernia is a bulge of tissue from inside the abdomen that pushes through a weak area of the muscles that form the front wall of the abdomen. This condition is treated with surgery, which may be urgent depending on your hernia. Do not lift anything that is heavier than 10 lb (4.5 kg), and follow activity instructions from your health care provider. This information is not intended to replace advice given to you by your health care provider. Make sure you discuss any questions you have with your healthcare provider. Document Revised: 10/05/2019 Document Reviewed: 10/05/2019 Elsevier Patient Education  [place].

## 2020-09-03 ENCOUNTER — Ambulatory Visit (INDEPENDENT_AMBULATORY_CARE_PROVIDER_SITE_OTHER): Payer: BC Managed Care – PPO | Admitting: Surgery

## 2020-09-03 ENCOUNTER — Encounter: Payer: Self-pay | Admitting: Surgery

## 2020-09-03 ENCOUNTER — Other Ambulatory Visit: Payer: Self-pay

## 2020-09-03 VITALS — BP 134/92 | HR 96 | Temp 98.3°F | Ht 67.0 in | Wt 219.0 lb

## 2020-09-03 DIAGNOSIS — K432 Incisional hernia without obstruction or gangrene: Secondary | ICD-10-CM

## 2020-09-03 MED ORDER — NICOTINE 14 MG/24HR TD PT24: 14.0000 mg | MEDICATED_PATCH | Freq: Every day | TRANSDERMAL | 1 refills | Status: DC
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
## 2020-09-03 NOTE — Patient Instructions (Addendum)
Follow up here in 1 month.   We would like you to loose about 20 pounds prior to surgery.   Try continuing to cut back on your smoking. Ideally you would want to be smoke free for at least 2 weeks prior to surgery.   Calorie Counting for Weight Loss Calories are units of energy. Your body needs a certain number of calories from food to keep going throughout the day. When you eat or drink more calories than your body needs, your body stores the extra calories mostly as fat. When you eat or drink fewer calories than your body needs, your body burns fat to getthe energy it needs. Calorie counting means keeping track of how many calories you eat and drink each day. Calorie counting can be helpful if you need to lose weight. If you eat fewer calories than your body needs, you should lose weight. Ask yourhealth care provider what a healthy weight is for you. For calorie counting to work, you will need to eat the right number of calories each day to lose a healthy amount of weight per week. A dietitian can help you figure out how many calories you need in a day and will suggest ways to reach your calorie goal. A healthy amount of weight to lose each week is usually 1-2 lb (0.5-0.9 kg). This usually means that your daily calorie intake should be reduced by 500-750 calories. Eating 1,200-1,500 calories a day can help most women lose weight. Eating 1,500-1,800 calories a day can help most men lose weight. What do I need to know about calorie counting? Work with your health care provider or dietitian to determine how many calories you should get each day. To meet your daily calorie goal, you will need to: Find out how many calories are in each food that you would like to eat. Try to do this before you eat. Decide how much of the food you plan to eat. Keep a food log. Do this by writing down what you ate and how many calories it had. To successfully lose weight, it is important to balance calorie counting with  ahealthy lifestyle that includes regular activity. Where do I find calorie information?  The number of calories in a food can be found on a Nutrition Facts label. If a food does not have a Nutrition Facts label, try to look up the calories onlineor ask your dietitian for help. Remember that calories are listed per serving. If you choose to have more than one serving of a food, you will have to multiply the calories per serving by the number of servings you plan to eat. For example, the label on a package of bread might say that a serving size is 1 slice and that there are 90 calories in a serving. If you eat 1 slice, you will have eaten 90 calories. If you eat 2slices, you will have eaten 180 calories. How do I keep a food log? After each time that you eat, record the following in your food log as soon as possible: What you ate. Be sure to include toppings, sauces, and other extras on the food. How much you ate. This can be measured in cups, ounces, or number of items. How many calories were in each food and drink. The total number of calories in the food you ate. Keep your food log near you, such as in a pocket-sized notebook or on an app or website on your mobile phone. Some programs will calculate calories  for you andshow you how many calories you have left to meet your daily goal. What are some portion-control tips? Know how many calories are in a serving. This will help you know how many servings you can have of a certain food. Use a measuring cup to measure serving sizes. You could also try weighing out portions on a kitchen scale. With time, you will be able to estimate serving sizes for some foods. Take time to put servings of different foods on your favorite plates or in your favorite bowls and cups so you know what a serving looks like. Try not to eat straight from a food's packaging, such as from a bag or box. Eating straight from the package makes it hard to see how much you are eating  and can lead to overeating. Put the amount you would like to eat in a cup or on a plate to make sure you are eating the right portion. Use smaller plates, glasses, and bowls for smaller portions and to prevent overeating. Try not to multitask. For example, avoid watching TV or using your computer while eating. If it is time to eat, sit down at a table and enjoy your food. This will help you recognize when you are full. It will also help you be more mindful of what and how much you are eating. What are tips for following this plan? Reading food labels Check the calorie count compared with the serving size. The serving size may be smaller than what you are used to eating. Check the source of the calories. Try to choose foods that are high in protein, fiber, and vitamins, and low in saturated fat, trans fat, and sodium. Shopping Read nutrition labels while you shop. This will help you make healthy decisions about which foods to buy. Pay attention to nutrition labels for low-fat or fat-free foods. These foods sometimes have the same number of calories or more calories than the full-fat versions. They also often have added sugar, starch, or salt to make up for flavor that was removed with the fat. Make a grocery list of lower-calorie foods and stick to it. Cooking Try to cook your favorite foods in a healthier way. For example, try baking instead of frying. Use low-fat dairy products. Meal planning Use more fruits and vegetables. One-half of your plate should be fruits and vegetables. Include lean proteins, such as chicken, [place], and fish. Lifestyle Each week, aim to do one of the following: 150 minutes of moderate exercise, such as walking. 75 minutes of vigorous exercise, such as running. General information Know how many calories are in the foods you eat most often. This will help you calculate calorie counts faster. Find a way of tracking calories that works for you. Get creative. Try  different apps or programs if writing down calories does not work for you. What foods should I eat?  Eat nutritious foods. It is better to have a nutritious, high-calorie food, such as an avocado, than a food with few nutrients, such as a bag of potato chips. Use your calories on foods and drinks that will fill you up and will not leave you hungry soon after eating. Examples of foods that fill you up are nuts and nut butters, vegetables, lean proteins, and high-fiber foods such as whole grains. High-fiber foods are foods with more than 5 g of fiber per serving. Pay attention to calories in drinks. Low-calorie drinks include water and unsweetened drinks. The items listed above may not be a  complete list of foods and beverages you can eat. Contact a dietitian for more information. What foods should I limit? Limit foods or drinks that are not good sources of vitamins, minerals, or protein or that are high in unhealthy fats. These include: Candy. Other sweets. Sodas, specialty coffee drinks, alcohol, and juice. The items listed above may not be a complete list of foods and beverages you should avoid. Contact a dietitian for more information. How do I count calories when eating out? Pay attention to portions. Often, portions are much larger when eating out. Try these tips to keep portions smaller: Consider sharing a meal instead of getting your own. If you get your own meal, eat only half of it. Before you start eating, ask for a container and put half of your meal into it. When available, consider ordering smaller portions from the menu instead of full portions. Pay attention to your food and drink choices. Knowing the way food is cooked and what is included with the meal can help you eat fewer calories. If calories are listed on the menu, choose the lower-calorie options. Choose dishes that include vegetables, fruits, whole grains, low-fat dairy products, and lean proteins. Choose items that are  boiled, broiled, grilled, or steamed. Avoid items that are buttered, battered, fried, or served with cream sauce. Items labeled as crispy are usually fried, unless stated otherwise. Choose water, low-fat milk, unsweetened iced tea, or other drinks without added sugar. If you want an alcoholic beverage, choose a lower-calorie option, such as a glass of wine or light beer. Ask for dressings, sauces, and syrups on the side. These are usually high in calories, so you should limit the amount you eat. If you want a salad, choose a garden salad and ask for grilled meats. Avoid extra toppings such as bacon, cheese, or fried items. Ask for the dressing on the side, or ask for olive oil and vinegar or lemon to use as dressing. Estimate how many servings of a food you are given. Knowing serving sizes will help you be aware of how much food you are eating at restaurants. Where to find more information Centers for Disease Control and Prevention: http://www.wolf.info/ U.[place]. Department of Agriculture: http://www.wilson-mendoza.org/ Summary Calorie counting means keeping track of how many calories you eat and drink each day. If you eat fewer calories than your body needs, you should lose weight. A healthy amount of weight to lose per week is usually 1-2 lb (0.5-0.9 kg). This usually means reducing your daily calorie intake by 500-750 calories. The number of calories in a food can be found on a Nutrition Facts label. If a food does not have a Nutrition Facts label, try to look up the calories online or ask your dietitian for help. Use smaller plates, glasses, and bowls for smaller portions and to prevent overeating. Use your calories on foods and drinks that will fill you up and not leave you hungry shortly after a meal. This information is not intended to replace advice given to you by your health care provider. Make sure you discuss any questions you have with your healthcare provider. Document Revised: 03/29/2019 Document Reviewed:  03/29/2019 Elsevier Patient Education  2022 [employer].

## 2020-09-16 ENCOUNTER — Telehealth: Payer: Self-pay

## 2020-09-16 NOTE — Telephone Encounter (Signed)
Patient called and has some concerns. He says in the last few days he has been getting bad sharp/cramping pains in his mid abdomen any time he eats or drinks. He says eating is worse. These pains last a few hours to all night last night. He denies any fever or chills and no nausea. He has been having bowel movements although they are small.  I will send a message to Dr Dahlia Byes about this and call the patient back.

## 2020-09-17 ENCOUNTER — Inpatient Hospital Stay
Admission: EM | Admit: 2020-09-17 | Discharge: 2020-09-23 | DRG: 337 | Disposition: A | Discharge status: Home / Self Care | Payer: BC Managed Care – PPO | Attending: Surgery | Admitting: Surgery

## 2020-09-17 ENCOUNTER — Encounter: Payer: Self-pay | Admitting: Emergency Medicine

## 2020-09-17 ENCOUNTER — Emergency Department: Payer: BC Managed Care – PPO

## 2020-09-17 ENCOUNTER — Other Ambulatory Visit: Payer: Self-pay

## 2020-09-17 ENCOUNTER — Ambulatory Visit: Payer: BC Managed Care – PPO | Admitting: Surgery

## 2020-09-17 DIAGNOSIS — K219 Gastro-esophageal reflux disease without esophagitis: Secondary | ICD-10-CM | POA: Diagnosis present

## 2020-09-17 DIAGNOSIS — K56609 Unspecified intestinal obstruction, unspecified as to partial versus complete obstruction: Secondary | ICD-10-CM | POA: Diagnosis not present

## 2020-09-17 DIAGNOSIS — R1030 Lower abdominal pain, unspecified: Secondary | ICD-10-CM

## 2020-09-17 DIAGNOSIS — Z79890 Hormone replacement therapy (postmenopausal): Secondary | ICD-10-CM

## 2020-09-17 DIAGNOSIS — D72829 Elevated white blood cell count, unspecified: Secondary | ICD-10-CM | POA: Diagnosis present

## 2020-09-17 DIAGNOSIS — E039 Hypothyroidism, unspecified: Secondary | ICD-10-CM | POA: Diagnosis present

## 2020-09-17 DIAGNOSIS — K5669 Other partial intestinal obstruction: Secondary | ICD-10-CM | POA: Diagnosis not present

## 2020-09-17 DIAGNOSIS — K660 Peritoneal adhesions (postprocedural) (postinfection): Secondary | ICD-10-CM | POA: Diagnosis present

## 2020-09-17 DIAGNOSIS — Z9081 Acquired absence of spleen: Secondary | ICD-10-CM

## 2020-09-17 DIAGNOSIS — Z885 Allergy status to narcotic agent status: Secondary | ICD-10-CM

## 2020-09-17 DIAGNOSIS — Z720 Tobacco use: Secondary | ICD-10-CM

## 2020-09-17 DIAGNOSIS — K56600 Partial intestinal obstruction, unspecified as to cause: Secondary | ICD-10-CM | POA: Diagnosis present

## 2020-09-17 DIAGNOSIS — K431 Incisional hernia with gangrene: Secondary | ICD-10-CM | POA: Diagnosis not present

## 2020-09-17 DIAGNOSIS — K430 Incisional hernia with obstruction, without gangrene: Principal | ICD-10-CM | POA: Diagnosis present

## 2020-09-17 DIAGNOSIS — K435 Parastomal hernia without obstruction or  gangrene: Secondary | ICD-10-CM | POA: Diagnosis present

## 2020-09-17 DIAGNOSIS — I10 Essential (primary) hypertension: Secondary | ICD-10-CM | POA: Diagnosis present

## 2020-09-17 DIAGNOSIS — K434 Parastomal hernia with gangrene: Secondary | ICD-10-CM | POA: Diagnosis not present

## 2020-09-17 DIAGNOSIS — Z20822 Contact with and (suspected) exposure to covid-19: Secondary | ICD-10-CM | POA: Diagnosis present

## 2020-09-17 DIAGNOSIS — Z79899 Other long term (current) drug therapy: Secondary | ICD-10-CM

## 2020-09-17 LAB — URINALYSIS, COMPLETE (UACMP) WITH MICROSCOPIC
Bacteria, UA: NONE SEEN
Bilirubin Urine: NEGATIVE
Glucose, UA: NEGATIVE mg/dL
Ketones, ur: NEGATIVE mg/dL
Leukocytes,Ua: NEGATIVE
Nitrite: NEGATIVE
Protein, ur: NEGATIVE mg/dL
Specific Gravity, Urine: 1.021 (ref 1.005–1.030)
Squamous Epithelial / HPF: NONE SEEN (ref 0–5)
pH: 5 (ref 5.0–8.0)

## 2020-09-17 LAB — COMPREHENSIVE METABOLIC PANEL
ALT: 24 U/L (ref 0–44)
AST: 19 U/L (ref 15–41)
Albumin: 4.1 g/dL (ref 3.5–5.0)
Alkaline Phosphatase: 94 U/L (ref 38–126)
Anion gap: 11 (ref 5–15)
BUN: 11 mg/dL (ref 6–20)
CO2: 26 mmol/L (ref 22–32)
Calcium: 9.1 mg/dL (ref 8.9–10.3)
Chloride: 102 mmol/L (ref 98–111)
Creatinine, Ser: 0.6 mg/dL — ABNORMAL LOW (ref 0.61–1.24)
GFR, Estimated: >60 mL/min (ref >60)
Glucose, Bld: 126 mg/dL — ABNORMAL HIGH (ref 70–99)
Potassium: 4 mmol/L (ref 3.5–5.1)
Sodium: 139 mmol/L (ref 135–145)
Total Bilirubin: 0.9 mg/dL (ref 0.3–1.2)
Total Protein: 6.9 g/dL (ref 6.5–8.1)

## 2020-09-17 LAB — CBC
HCT: 49 % (ref 39.0–52.0)
Hemoglobin: 16.4 g/dL (ref 13.0–17.0)
MCH: 30.4 pg (ref 26.0–34.0)
MCHC: 33.5 g/dL (ref 30.0–36.0)
MCV: 90.9 fL (ref 80.0–100.0)
Platelets: 323 10*3/uL (ref 150–400)
RBC: 5.39 MIL/uL (ref 4.22–5.81)
RDW: 15.3 % (ref 11.5–15.5)
WBC: 11.1 10*3/uL — ABNORMAL HIGH (ref 4.0–10.5)
nRBC: 0 % (ref 0.0–0.2)

## 2020-09-17 LAB — RESP PANEL BY RT-PCR (FLU A&B, COVID) ARPGX2
Influenza A by PCR: NEGATIVE
Influenza B by PCR: NEGATIVE
SARS Coronavirus 2 by RT PCR: NEGATIVE

## 2020-09-17 LAB — LIPASE, BLOOD: Lipase: 35 U/L (ref 11–51)

## 2020-09-17 MED ORDER — ONDANSETRON 4 MG PO TBDP: 4.0000 mg | ORAL_TABLET | Freq: Four times a day (QID) | ORAL | Status: DC | PRN

## 2020-09-17 MED ORDER — HYDROMORPHONE HCL 1 MG/ML IJ SOLN
0.5000 mg | INTRAMUSCULAR | Status: DC | PRN
  Administered 2020-09-18 – 2020-09-23 (×26): 1 mg via INTRAVENOUS
  Filled 2020-09-17 (×28): qty 1

## 2020-09-17 MED ORDER — OXYCODONE HCL 5 MG PO TABS
5.0000 mg | ORAL_TABLET | ORAL | Status: DC | PRN
  Administered 2020-09-17 – 2020-09-19 (×5): 10 mg via ORAL
  Filled 2020-09-17 (×6): qty 2

## 2020-09-17 MED ORDER — ENOXAPARIN SODIUM 40 MG/0.4ML IJ SOSY: 40.0000 mg | PREFILLED_SYRINGE | INTRAMUSCULAR | Status: DC

## 2020-09-17 MED ORDER — IOHEXOL 300 MG/ML  SOLN
100.0000 mL | Freq: Once | INTRAMUSCULAR | Status: AC | PRN
  Administered 2020-09-17: 100 mL via INTRAVENOUS

## 2020-09-17 MED ORDER — ESCITALOPRAM OXALATE 10 MG PO TABS
10.0000 mg | ORAL_TABLET | Freq: Every day | ORAL | Status: DC
  Administered 2020-09-17 – 2020-09-23 (×7): 10 mg via ORAL
  Filled 2020-09-17 (×7): qty 1

## 2020-09-17 MED ORDER — CHLORHEXIDINE GLUCONATE CLOTH 2 % EX PADS
6.0000 | MEDICATED_PAD | Freq: Once | CUTANEOUS | Status: AC
  Administered 2020-09-17: 6 via TOPICAL
  Filled 2020-09-17: qty 6

## 2020-09-17 MED ORDER — BUPRENORPHINE HCL 2 MG SL SUBL
2.0000 mg | SUBLINGUAL_TABLET | Freq: Three times a day (TID) | SUBLINGUAL | Status: DC
  Administered 2020-09-17 – 2020-09-23 (×16): 2 mg via SUBLINGUAL
  Filled 2020-09-17 (×15): qty 1

## 2020-09-17 MED ORDER — LACTATED RINGERS IV BOLUS
1000.0000 mL | Freq: Once | INTRAVENOUS | Status: AC
  Administered 2020-09-17: 1000 mL via INTRAVENOUS

## 2020-09-17 MED ORDER — GABAPENTIN 300 MG PO CAPS: 300.0000 mg | ORAL_CAPSULE | Freq: Two times a day (BID) | ORAL | Status: DC | PRN

## 2020-09-17 MED ORDER — IBUPROFEN 400 MG PO TABS
600.0000 mg | ORAL_TABLET | Freq: Four times a day (QID) | ORAL | Status: DC | PRN
Start: 2020-09-17 — End: 2020-09-18

## 2020-09-17 MED ORDER — LEVOTHYROXINE SODIUM 100 MCG PO TABS
200.0000 ug | ORAL_TABLET | Freq: Every day | ORAL | Status: DC
  Administered 2020-09-19 – 2020-09-23 (×5): 200 ug via ORAL
  Filled 2020-09-17 (×5): qty 2

## 2020-09-17 MED ORDER — SODIUM CHLORIDE 0.9 % IV SOLN
2.0000 g | INTRAVENOUS | Status: AC
  Administered 2020-09-18: 2 g via INTRAVENOUS
  Filled 2020-09-17: qty 2

## 2020-09-17 MED ORDER — ONDANSETRON HCL 4 MG/2ML IJ SOLN: 4.0000 mg | Freq: Four times a day (QID) | INTRAMUSCULAR | Status: DC | PRN

## 2020-09-17 MED ORDER — KETOROLAC TROMETHAMINE 30 MG/ML IJ SOLN
15.0000 mg | INTRAMUSCULAR | Status: AC
  Administered 2020-09-17: 15 mg via INTRAVENOUS
  Filled 2020-09-17: qty 1

## 2020-09-17 MED ORDER — ACETAMINOPHEN 500 MG PO TABS
1000.0000 mg | ORAL_TABLET | Freq: Four times a day (QID) | ORAL | Status: DC
  Administered 2020-09-17 – 2020-09-23 (×19): 1000 mg via ORAL
  Filled 2020-09-17 (×22): qty 2

## 2020-09-17 MED ORDER — SODIUM CHLORIDE 0.9 % IV SOLN: INTRAVENOUS | Status: DC

## 2020-09-17 MED ORDER — LISINOPRIL 20 MG PO TABS
20.0000 mg | ORAL_TABLET | ORAL | Status: DC
  Administered 2020-09-19 – 2020-09-23 (×5): 20 mg via ORAL
  Filled 2020-09-17 (×5): qty 1

## 2020-09-17 MED ORDER — NICOTINE 14 MG/24HR TD PT24
14.0000 mg | MEDICATED_PATCH | Freq: Every day | TRANSDERMAL | Status: DC
  Administered 2020-09-20 – 2020-09-23 (×2): 14 mg via TRANSDERMAL
  Filled 2020-09-17 (×7): qty 1

## 2020-09-17 MED ORDER — PANTOPRAZOLE SODIUM 40 MG IV SOLR
40.0000 mg | Freq: Once | INTRAVENOUS | Status: AC
  Administered 2020-09-17: 40 mg via INTRAVENOUS
  Filled 2020-09-17: qty 40

## 2020-09-17 MED ORDER — CHLORHEXIDINE GLUCONATE CLOTH 2 % EX PADS
6.0000 | MEDICATED_PAD | Freq: Once | CUTANEOUS | Status: AC
  Administered 2020-09-18: 6 via TOPICAL
  Filled 2020-09-17: qty 6

## 2020-09-17 NOTE — ED Notes (Signed)
Taken to CT.
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
## 2020-09-17 NOTE — ED Triage Notes (Signed)
Lower abdominal pain x 3 days.  States has history of diverticulitis and hernias.

## 2020-09-17 NOTE — ED Notes (Signed)
Patient given apple juice. Meal tray delivered to bedside at this time.
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
## 2020-09-18 ENCOUNTER — Encounter
Admission: EM | Disposition: A | Discharge status: Home / Self Care | Payer: Self-pay | Source: Home / Self Care | Attending: Surgery

## 2020-09-18 ENCOUNTER — Encounter: Payer: Self-pay | Admitting: Surgery

## 2020-09-18 ENCOUNTER — Observation Stay: Payer: BC Managed Care – PPO | Admitting: Certified Registered Nurse Anesthetist

## 2020-09-18 DIAGNOSIS — K434 Parastomal hernia with gangrene: Secondary | ICD-10-CM

## 2020-09-18 DIAGNOSIS — K431 Incisional hernia with gangrene: Secondary | ICD-10-CM

## 2020-09-18 HISTORY — PX: ABDOMINAL WALL DEFECT REPAIR: SHX53

## 2020-09-18 HISTORY — PX: MINOR APPLICATION OF WOUND VAC: SHX6243

## 2020-09-18 SURGERY — REPAIR, ABDOMINAL WALL
Anesthesia: General | Site: Abdomen

## 2020-09-18 MED ORDER — LACTATED RINGERS IV SOLN: INTRAVENOUS | Status: DC | PRN

## 2020-09-18 MED ORDER — FENTANYL CITRATE (PF) 100 MCG/2ML IJ SOLN
INTRAMUSCULAR | Status: AC
  Administered 2020-09-18: 50 ug via INTRAVENOUS
  Filled 2020-09-18: qty 2

## 2020-09-18 MED ORDER — HYDROMORPHONE HCL 1 MG/ML IJ SOLN
INTRAMUSCULAR | Status: AC
  Filled 2020-09-18: qty 1

## 2020-09-18 MED ORDER — ROCURONIUM BROMIDE 100 MG/10ML IV SOLN
INTRAVENOUS | Status: DC | PRN
  Administered 2020-09-18 (×2): 20 mg via INTRAVENOUS
  Administered 2020-09-18: 50 mg via INTRAVENOUS
  Administered 2020-09-18 (×3): 20 mg via INTRAVENOUS
  Administered 2020-09-18: 30 mg via INTRAVENOUS
  Administered 2020-09-18: 50 mg via INTRAVENOUS

## 2020-09-18 MED ORDER — DEXAMETHASONE SODIUM PHOSPHATE 10 MG/ML IJ SOLN
INTRAMUSCULAR | Status: DC | PRN
  Administered 2020-09-18: 10 mg via INTRAVENOUS

## 2020-09-18 MED ORDER — LIDOCAINE HCL (CARDIAC) PF 100 MG/5ML IV SOSY
PREFILLED_SYRINGE | INTRAVENOUS | Status: DC | PRN
  Administered 2020-09-18: 100 mg via INTRAVENOUS

## 2020-09-18 MED ORDER — HYDRALAZINE HCL 20 MG/ML IJ SOLN
INTRAMUSCULAR | Status: AC
  Filled 2020-09-18: qty 1

## 2020-09-18 MED ORDER — PROPOFOL 500 MG/50ML IV EMUL
INTRAVENOUS | Status: AC
  Filled 2020-09-18: qty 50

## 2020-09-18 MED ORDER — CHLORHEXIDINE GLUCONATE 0.12 % MT SOLN: 15.0000 mL | Freq: Once | OROMUCOSAL | Status: AC

## 2020-09-18 MED ORDER — LACTATED RINGERS IV SOLN: INTRAVENOUS | Status: DC

## 2020-09-18 MED ORDER — MIDAZOLAM HCL 2 MG/2ML IJ SOLN
INTRAMUSCULAR | Status: DC | PRN
  Administered 2020-09-18: 2 mg via INTRAVENOUS

## 2020-09-18 MED ORDER — SODIUM CHLORIDE (PF) 0.9 % IJ SOLN
INTRAMUSCULAR | Status: DC | PRN
  Administered 2020-09-18: 50 mL

## 2020-09-18 MED ORDER — LIDOCAINE HCL (PF) 2 % IJ SOLN
INTRAMUSCULAR | Status: AC
  Filled 2020-09-18: qty 40

## 2020-09-18 MED ORDER — IPRATROPIUM-ALBUTEROL 0.5-2.5 (3) MG/3ML IN SOLN
RESPIRATORY_TRACT | Status: AC
  Filled 2020-09-18: qty 3

## 2020-09-18 MED ORDER — ROCURONIUM BROMIDE 10 MG/ML (PF) SYRINGE
PREFILLED_SYRINGE | INTRAVENOUS | Status: AC
  Filled 2020-09-18: qty 40

## 2020-09-18 MED ORDER — KETAMINE HCL 50 MG/5ML IJ SOSY
PREFILLED_SYRINGE | INTRAMUSCULAR | Status: AC
  Filled 2020-09-18: qty 5

## 2020-09-18 MED ORDER — ONDANSETRON HCL 4 MG/2ML IJ SOLN
INTRAMUSCULAR | Status: AC
  Filled 2020-09-18: qty 2

## 2020-09-18 MED ORDER — BUPIVACAINE-EPINEPHRINE (PF) 0.25% -1:200000 IJ SOLN
INTRAMUSCULAR | Status: AC
  Filled 2020-09-18: qty 30

## 2020-09-18 MED ORDER — CEFAZOLIN SODIUM-DEXTROSE 1-4 GM/50ML-% IV SOLN
1.0000 g | Freq: Three times a day (TID) | INTRAVENOUS | Status: AC
  Administered 2020-09-18 – 2020-09-19 (×3): 1 g via INTRAVENOUS
  Filled 2020-09-18 (×3): qty 50

## 2020-09-18 MED ORDER — MIDAZOLAM HCL 2 MG/2ML IJ SOLN
INTRAMUSCULAR | Status: AC
  Filled 2020-09-18: qty 2

## 2020-09-18 MED ORDER — 0.9 % SODIUM CHLORIDE (POUR BTL) OPTIME
TOPICAL | Status: DC | PRN
  Administered 2020-09-18: 150 mL

## 2020-09-18 MED ORDER — FENTANYL CITRATE (PF) 100 MCG/2ML IJ SOLN
INTRAMUSCULAR | Status: AC
  Filled 2020-09-18: qty 2

## 2020-09-18 MED ORDER — SODIUM CHLORIDE (PF) 0.9 % IJ SOLN
INTRAMUSCULAR | Status: AC
  Filled 2020-09-18: qty 50

## 2020-09-18 MED ORDER — IPRATROPIUM-ALBUTEROL 0.5-2.5 (3) MG/3ML IN SOLN: 3.0000 mL | Freq: Once | RESPIRATORY_TRACT | Status: DC

## 2020-09-18 MED ORDER — HYDROMORPHONE HCL 1 MG/ML IJ SOLN
INTRAMUSCULAR | Status: DC | PRN
  Administered 2020-09-18 (×4): .5 mg via INTRAVENOUS

## 2020-09-18 MED ORDER — HYDRALAZINE HCL 20 MG/ML IJ SOLN
5.0000 mg | INTRAMUSCULAR | Status: DC | PRN
  Administered 2020-09-18: 5 mg via INTRAVENOUS

## 2020-09-18 MED ORDER — IPRATROPIUM-ALBUTEROL 0.5-2.5 (3) MG/3ML IN SOLN
3.0000 mL | RESPIRATORY_TRACT | Status: DC
  Administered 2020-09-18: 3 mL via RESPIRATORY_TRACT

## 2020-09-18 MED ORDER — CHLORHEXIDINE GLUCONATE 0.12 % MT SOLN
OROMUCOSAL | Status: AC
  Administered 2020-09-18: 15 mL via OROMUCOSAL
  Filled 2020-09-18: qty 15

## 2020-09-18 MED ORDER — KETOROLAC TROMETHAMINE 30 MG/ML IJ SOLN
15.0000 mg | Freq: Four times a day (QID) | INTRAMUSCULAR | Status: DC
  Administered 2020-09-19 (×2): 15 mg via INTRAVENOUS
  Filled 2020-09-18 (×2): qty 1

## 2020-09-18 MED ORDER — PROPOFOL 10 MG/ML IV BOLUS
INTRAVENOUS | Status: DC | PRN
  Administered 2020-09-18: 150 mg via INTRAVENOUS
  Administered 2020-09-18: 50 mg via INTRAVENOUS

## 2020-09-18 MED ORDER — ROCURONIUM BROMIDE 10 MG/ML (PF) SYRINGE
PREFILLED_SYRINGE | INTRAVENOUS | Status: AC
  Filled 2020-09-18: qty 10

## 2020-09-18 MED ORDER — FENTANYL CITRATE (PF) 100 MCG/2ML IJ SOLN
INTRAMUSCULAR | Status: DC | PRN
  Administered 2020-09-18 (×2): 50 ug via INTRAVENOUS
  Administered 2020-09-18: 100 ug via INTRAVENOUS

## 2020-09-18 MED ORDER — HYDRALAZINE HCL 20 MG/ML IJ SOLN
5.0000 mg | Freq: Four times a day (QID) | INTRAMUSCULAR | Status: DC | PRN
  Administered 2020-09-18: 5 mg via INTRAVENOUS
  Filled 2020-09-18: qty 1

## 2020-09-18 MED ORDER — BUPIVACAINE LIPOSOME 1.3 % IJ SUSP
INTRAMUSCULAR | Status: DC | PRN
  Administered 2020-09-18: 20 mL

## 2020-09-18 MED ORDER — LIDOCAINE HCL (PF) 2 % IJ SOLN
INTRAMUSCULAR | Status: DC | PRN
Start: 2020-09-18 — End: 2020-09-18
  Administered 2020-09-18: 1 mg/kg/h via INTRADERMAL

## 2020-09-18 MED ORDER — KETOROLAC TROMETHAMINE 30 MG/ML IJ SOLN
INTRAMUSCULAR | Status: DC | PRN
  Administered 2020-09-18: 30 mg via INTRAVENOUS

## 2020-09-18 MED ORDER — ONDANSETRON HCL 4 MG/2ML IJ SOLN
INTRAMUSCULAR | Status: AC
  Filled 2020-09-18: qty 8

## 2020-09-18 MED ORDER — DEXMEDETOMIDINE (PRECEDEX) IN NS 20 MCG/5ML (4 MCG/ML) IV SYRINGE
PREFILLED_SYRINGE | INTRAVENOUS | Status: DC | PRN
  Administered 2020-09-18: 8 ug via INTRAVENOUS
  Administered 2020-09-18 (×3): 4 ug via INTRAVENOUS

## 2020-09-18 MED ORDER — SODIUM CHLORIDE 0.9 % IV SOLN
INTRAVENOUS | Status: AC
  Filled 2020-09-18: qty 2

## 2020-09-18 MED ORDER — ONDANSETRON HCL 4 MG/2ML IJ SOLN
INTRAMUSCULAR | Status: DC | PRN
  Administered 2020-09-18 (×2): 4 mg via INTRAVENOUS

## 2020-09-18 MED ORDER — ENOXAPARIN SODIUM 60 MG/0.6ML IJ SOSY
0.5000 mg/kg | PREFILLED_SYRINGE | INTRAMUSCULAR | Status: DC
Start: 2020-09-19 — End: 2020-09-23
  Administered 2020-09-19 – 2020-09-23 (×5): 47.5 mg via SUBCUTANEOUS
  Filled 2020-09-18 (×6): qty 0.6

## 2020-09-18 MED ORDER — OXYCODONE HCL 5 MG PO TABS: 20.0000 mg | ORAL_TABLET | Freq: Once | ORAL | Status: DC | PRN

## 2020-09-18 MED ORDER — GLYCOPYRROLATE 0.2 MG/ML IJ SOLN
INTRAMUSCULAR | Status: AC
  Filled 2020-09-18: qty 1

## 2020-09-18 MED ORDER — DEXAMETHASONE SODIUM PHOSPHATE 10 MG/ML IJ SOLN
INTRAMUSCULAR | Status: AC
  Filled 2020-09-18: qty 3

## 2020-09-18 MED ORDER — ONDANSETRON HCL 4 MG/2ML IJ SOLN
4.0000 mg | Freq: Once | INTRAMUSCULAR | Status: AC
  Administered 2020-09-18: 4 mg via INTRAVENOUS

## 2020-09-18 MED ORDER — BUPIVACAINE LIPOSOME 1.3 % IJ SUSP
INTRAMUSCULAR | Status: AC
  Filled 2020-09-18: qty 20

## 2020-09-18 MED ORDER — PREGABALIN 50 MG PO CAPS
100.0000 mg | ORAL_CAPSULE | Freq: Three times a day (TID) | ORAL | Status: DC
  Administered 2020-09-19 – 2020-09-21 (×9): 100 mg via ORAL
  Filled 2020-09-18 (×4): qty 2
  Filled 2020-09-18: qty 4
  Filled 2020-09-18: qty 2
  Filled 2020-09-18: qty 4
  Filled 2020-09-18 (×2): qty 2

## 2020-09-18 MED ORDER — SUGAMMADEX SODIUM 200 MG/2ML IV SOLN
INTRAVENOUS | Status: DC | PRN
  Administered 2020-09-18: 200 mg via INTRAVENOUS

## 2020-09-18 MED ORDER — FENTANYL CITRATE (PF) 100 MCG/2ML IJ SOLN
50.0000 ug | INTRAMUSCULAR | Status: DC | PRN
  Administered 2020-09-18: 50 ug via INTRAVENOUS

## 2020-09-18 MED ORDER — BUPIVACAINE-EPINEPHRINE (PF) 0.25% -1:200000 IJ SOLN
INTRAMUSCULAR | Status: DC | PRN
  Administered 2020-09-18: 30 mL

## 2020-09-18 MED ORDER — KETAMINE HCL 10 MG/ML IJ SOLN
INTRAMUSCULAR | Status: DC | PRN
  Administered 2020-09-18 (×2): 10 mg via INTRAVENOUS
  Administered 2020-09-18: 50 mg via INTRAVENOUS
  Administered 2020-09-18: 10 mg via INTRAVENOUS

## 2020-09-18 SURGICAL SUPPLY — 54 items
APPLIER CLIP 11 MED OPEN (CLIP)
APPLIER CLIP 13 LRG OPEN (CLIP)
BARRIER ADH SEPRAFILM 3INX5IN (MISCELLANEOUS) ×20 IMPLANT
BLADE CLIPPER SURG (BLADE) ×4 IMPLANT
BULB RESERV EVAC DRAIN JP 100C (MISCELLANEOUS) ×12 IMPLANT
CANISTER WOUND CARE 500ML ATS (WOUND CARE) ×4 IMPLANT
CHLORAPREP W/TINT 26 (MISCELLANEOUS) ×8 IMPLANT
CLEANER CAUTERY TIP 5X5 PAD (MISCELLANEOUS) ×3 IMPLANT
CLIP APPLIE 11 MED OPEN (CLIP) IMPLANT
CLIP APPLIE 13 LRG OPEN (CLIP) IMPLANT
DRAIN CHANNEL JP 19F (MISCELLANEOUS) ×12 IMPLANT
DRAPE LAPAROTOMY 100X77 ABD (DRAPES) ×4 IMPLANT
DRSG TELFA 3X8 NADH (GAUZE/BANDAGES/DRESSINGS) IMPLANT
ELECT BLADE 4.0 EZ CLEAN MEGAD (MISCELLANEOUS) ×4
ELECT CAUTERY BLADE 6.4 (BLADE) ×8 IMPLANT
ELECT REM PT RETURN 9FT ADLT (ELECTROSURGICAL) ×4
ELECTRODE BLDE 4.0 EZ CLN MEGD (MISCELLANEOUS) ×3 IMPLANT
ELECTRODE REM PT RTRN 9FT ADLT (ELECTROSURGICAL) ×3 IMPLANT
GAUZE 4X4 16PLY ~~LOC~~+RFID DBL (SPONGE) ×4 IMPLANT
GAUZE SPONGE 4X4 12PLY STRL (GAUZE/BANDAGES/DRESSINGS) IMPLANT
GLOVE SURG ENC MOIS LTX SZ7 (GLOVE) ×4 IMPLANT
GOWN STRL REUS W/ TWL LRG LVL3 (GOWN DISPOSABLE) ×6 IMPLANT
GOWN STRL REUS W/TWL LRG LVL3 (GOWN DISPOSABLE) ×2
HOLDER FOLEY CATH W/STRAP (MISCELLANEOUS) ×4 IMPLANT
MANIFOLD NEPTUNE II (INSTRUMENTS) ×4 IMPLANT
MESH SOFT 12X12IN BARD (Mesh General) ×8 IMPLANT
NEEDLE HYPO 22GX1.5 SAFETY (NEEDLE) ×4 IMPLANT
NEEDLE HYPO 25X1 1.5 SAFETY (NEEDLE) ×4 IMPLANT
NS IRRIG 1000ML POUR BTL (IV SOLUTION) ×12 IMPLANT
PACK BASIN MINOR ARMC (MISCELLANEOUS) ×4 IMPLANT
PAD CLEANER CAUTERY TIP 5X5 (MISCELLANEOUS) ×1
PREVENA INCISION MGT 90 150 (MISCELLANEOUS) ×4 IMPLANT
SPONGE DRAIN TRACH 4X4 STRL 2S (GAUZE/BANDAGES/DRESSINGS) ×12 IMPLANT
SPONGE KITTNER 5P (MISCELLANEOUS) ×4 IMPLANT
SPONGE T-LAP 18X18 ~~LOC~~+RFID (SPONGE) ×40 IMPLANT
STAPLER SKIN PROX 35W (STAPLE) ×8 IMPLANT
SUT ETHIBOND 0 MO6 C/R (SUTURE) IMPLANT
SUT ETHILON 3-0 FS-10 30 BLK (SUTURE) ×12
SUT PDS AB 0 CT1 27 (SUTURE) ×28 IMPLANT
SUT PROLENE 5 0 RB 1 DA (SUTURE) ×4 IMPLANT
SUT SILK 2 0 (SUTURE) ×2
SUT SILK 2 0SH CR/8 30 (SUTURE) ×8 IMPLANT
SUT SILK 2-0 18XBRD TIE 12 (SUTURE) ×3 IMPLANT
SUT SILK 2-0 30XBRD TIE 12 (SUTURE) ×3 IMPLANT
SUT VIC AB 2-0 SH 27 (SUTURE) ×2
SUT VIC AB 2-0 SH 27XBRD (SUTURE) ×6 IMPLANT
SUT VIC AB 3-0 SH 27 (SUTURE) ×6
SUT VIC AB 3-0 SH 27X BRD (SUTURE) ×18 IMPLANT
SUTURE EHLN 3-0 FS-10 30 BLK (SUTURE) ×9 IMPLANT
SYR 20ML LL LF (SYRINGE) ×4 IMPLANT
SYR BULB IRRIG 60ML STRL (SYRINGE) ×4 IMPLANT
TAPE MICROFOAM 4IN (TAPE) IMPLANT
TRAY FOLEY MTR SLVR 16FR STAT (SET/KITS/TRAYS/PACK) ×4 IMPLANT
WATER STERILE IRR 1000ML POUR (IV SOLUTION) IMPLANT

## 2020-09-18 NOTE — Anesthesia Postprocedure Evaluation (Signed)
Anesthesia Post Note  Patient: Brandon Mullen  Procedure(s) Performed: ABDOMINAL WALL RECONSTRUCTION MINOR APPLICATION OF WOUND VAC (Abdomen)  Patient location during evaluation: PACU Anesthesia Type: General Level of consciousness: awake and alert Pain management: pain level controlled Vital Signs Assessment: post-procedure vital signs reviewed and stable Respiratory status: spontaneous breathing, nonlabored ventilation, respiratory function stable and patient connected to nasal cannula oxygen Cardiovascular status: blood pressure returned to baseline and stable Postop Assessment: no apparent nausea or vomiting Anesthetic complications: no   No notable events documented.   Last Vitals:  Vitals:   09/18/20 2215 09/18/20 2241  BP: (!) 152/97 (!) 162/106  Pulse: 89 91  Resp: 18 18  Temp:    SpO2: 92% 93%    Last Pain:  Vitals:   09/18/20 2241  TempSrc:   PainSc: 10-Worst pain ever                 Precious Haws Abagale Boulos

## 2020-09-18 NOTE — Progress Notes (Signed)
Patient left for pre-op by bed with Jenny Reichmann, OR tech. Alert with no distress noted when leaving floor.

## 2020-09-18 NOTE — Progress Notes (Signed)
[facility] Day(s): 0.   Interval History:  Patient seen and examined No acute events or new complaints overnight.  Patient reports he is doing okay, still with some soreness No fever, chills, nausea, emesis He does have a mild leukocytosis to 11.9K this morning Renal function remains normal; sCr - 0.60; UO - unmeasured No electrolyte derangements Plan today for abdominal wall reconstruction   Vital signs in last 24 hours: [min-max] current  Temp:  [98.2 F (36.8 C)-98.8 F (37.1 C)] 98.3 F (36.8 C) (07/21 0430) Pulse Rate:  [57-97] 63 (07/21 0430) Resp:  [15-18] 18 (07/21 0430) BP: (126-136)/(82-95) 127/88 (07/21 0430) SpO2:  [93 %-98 %] 98 % (07/21 0430) Weight:  [99.3 kg] 99.3 kg (07/20 0744)     Height: 5\' 7"  (170.2 cm) Weight: 99.3 kg BMI (Calculated): 34.28   Intake/Output last 2 shifts:  07/20 0701 - 07/21 0700 In: 1332.8 [I.V.:332.8; IV Piggyback:1000] Out: -    Physical Exam:  Constitutional: alert, cooperative and no distress  Respiratory: breathing non-labored at rest  Cardiovascular: regular rate and sinus rhythm  Gastrointestinal: Soft, non-tender, non-distended. Two midline ventral hernia, soft. Large RLQ hernia.  Integumentary: warm, dry. Numerous abdominal scars present  Labs:  CBC Latest Ref Rng & Units 09/17/2020 06/28/2019 06/27/2019  WBC 4.0 - 10.5 K/uL 11.1(H) 15.9(H) 22.5(H)  Hemoglobin 13.0 - 17.0 g/dL 16.4 14.3 13.2  Hematocrit 39.0 - 52.0 % 49.0 44.0 40.2  Platelets 150 - 400 K/uL 323 399 400   CMP Latest Ref Rng & Units 09/17/2020 09/21/2019 09/04/2019  Glucose 70 - 99 mg/dL 126(H) 111(H) -  BUN 6 - 20 mg/dL 11 14 -  Creatinine 0.61 - 1.24 mg/dL 0.60(L) 0.84 0.80  Sodium 135 - 145 mmol/L 139 140 -  Potassium 3.5 - 5.1 mmol/L 4.0 4.9 -  Chloride 98 - 111 mmol/L 102 100 -  CO2 22 - 32 mmol/L 26 28 -  Calcium 8.9 - 10.3 mg/dL 9.1 9.8 -  Total Protein 6.5 - 8.1 g/dL 6.9 - -  Total Bilirubin 0.3 -  1.2 mg/dL 0.9 - -  Alkaline Phos 38 - 126 U/L 94 - -  AST 15 - 41 U/L 19 - -  ALT 0 - 44 U/L 24 - -     Imaging studies: No new pertinent imaging studies   Assessment/Plan:  45 y.o. male with multiple ventral hernia and loss of domain with plan for abdominal wall reconstruction today   - Will plan on ventral hernia repair with abdominal wall reconstruction today with Dr Dahlia Byes pending OR/Anesthesia availability - All risks, benefits, and alternatives to above procedure(s) were discussed with the patient, all of his questions were answered to his expressed satisfaction, patient expresses he wishes to proceed, and informed consent was obtained.    - NPO + IVF Resuscitation  - Monitor abdominal examination   - Pain control prn; antiemetics prn - Mobilization as tolerated    All of the above findings and recommendations were discussed with the patient, and the medical team, and all of patient's questions were answered to his expressed satisfaction.  -- Edison Simon, PA-C  [facility] 09/18/2020, 7:42 AM [phone] M-F: 7am - 4pm

## 2020-09-18 NOTE — Op Note (Addendum)
PROCEDURES: 1. Abdominal wall reconstruction with bilateral myocutaneous flaps using a TAR release and a medium weight macro porus polypropylene mesh  x 2 measuring 30.5 x 30.5 cm. We used a diamond configuration for the Pelvis and the subxiphoid we placed an additional mesh in a home plate configuration.,  2. Incisional hernia repair 3. Wound vac Placement 48 x 2 cm ( Prevena plus) 4. 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 with endotracheal tube  Assistant: Dr. Hampton Abbot ( essential due to the complexity of the procedure and to have adequate exposure)  Findings: Large recurrent ventral hernia with parastomal hernia with loss of domain Great coverage of defect with reconstruction of  the abd wall   Estimated Blood Loss: 200cc         Drains: 19 FR x 3. Two on top of the mesh and another one on the sub q space                Complications: none        Condition: stable  Procedure Details  The patient was seen again in the Holding Room. The benefits, complications, treatment options, and expected outcomes were discussed with the patient. The risks of bleeding, infection, recurrence of symptoms, failure to resolve symptoms,  bowel injury, any of which could require further surgery were reviewed with the patient.   The patient was taken to Operating Room, identified and the procedure verified.  A Time Out was held and the above information confirmed.  Generous elliptical incision was created from pubis to xiphoid incorporating the old scar.  The subcutaneous tissue is divided and the fascia and the hernia sac are identified. The peritoneal cavity was entered.  Extensive adhesions were performed using a combination of  electrocautery and Metzenbaum scissors.  There was significant adhesions of the small bowel to the abdominal wall and the omentum to the abdominal wall. Our enterolysis took about 2 hrs of operative time.  Once we had good lysis of adhesions we entered the retrorectus space by identifying/ incising the posterior sheath.Dissection was difficult due to prior mesh fusion to the transverse abdominus muscle. This dissection is then performed laterally to the linea semilunaris, the most lateral aspect of the retrorectus space, using a combination of both blunt and sharp dissection    The retromuscular space is then bluntly developed to as far as the lateral border of the psoas muscle. The dissection is repeated on the opposite site and  Was carried superiorly to the central tendon of the diaphragm, using a subxiphoid and retrosternal dissection. Dissection also  was developed inferiorly to the retropubic space.  Sepra films pieces were used to avoid adhesions. Next, the posterior rectus sheaths are approximated to one another with 0 PDS suture. I removed some sharp pieces of mesh that were interfering with appropriate closure. Using liposomal Marcaine TAP Block was performed on each side under direct visualization.   I Selected tw 30 x 30 cm microcuries polypropylene mesh and in a diamond configuration place it anterior to the peritoneum. We used a diamond configuration for the Pelvis and the subxiphoid we placed an additional mesh in a home plate configuration.,   he mesh lay very flat on in a corset fashion.  I was very happy with  the repair.  Two 19 French Blake drain were placed in the retrorectus space in the standard fashion.  The retrorectus drains were placed on the lower abdominal wall.  Attention Was turned to the anterior sheath.  This was approximated with multiple running 0 PDS sutures.  We made sure that we released some of the tension that was attached to the subcutaneous tissue. And the  significant that space I decided to also place a 19 Blake drain within the subcutaneous tissue.  The exit of this drain was to the left upper quadrant.  The wound was irrigated with saline and sterile water.  It was approximated with a staples and a Prevena plus wound VAC was placed in the standard fashion.  Needle and laparotomy count were correct and there were no immediate complications  Caroleen Hamman, MD, FACS

## 2020-09-18 NOTE — Progress Notes (Signed)
Anticoagulation monitoring(Lovenox):  45 yo male ordered Lovenox 40 mg Q24h    Filed Weights   09/17/20 0744 09/18/20 1233  Weight: 99.3 kg (218 lb 14.7 oz) 97.1 kg (214 lb)   BMI 33.52   Lab Results  Component Value Date   CREATININE 0.60 (L) 09/17/2020   CREATININE 0.84 09/21/2019   CREATININE 0.80 09/04/2019   Estimated Creatinine Clearance: 128.1 mL/min (A) (by C-G formula based on SCr of 0.6 mg/dL (L)). Hemoglobin & Hematocrit     Component Value Date/Time   HGB 16.4 09/17/2020 0748   HCT 49.0 09/17/2020 0748     Per Protocol for Patient with estCrcl > 30 ml/min and BMI > 30, will transition to Lovenox 47.5 mg Q24h.

## 2020-09-18 NOTE — Transfer of Care (Signed)
Immediate Anesthesia Transfer of Care Note  Patient: Brandon Mullen  Procedure(s) Performed: ABDOMINAL WALL RECONSTRUCTION MINOR APPLICATION OF WOUND VAC (Abdomen)  Patient Location: PACU  Anesthesia Type:General  Level of Consciousness: sedated  Airway & Oxygen Therapy: Patient Spontanous Breathing and Patient connected to face mask oxygen  Post-op Assessment: Report given to RN and Post -op Vital signs reviewed and stable  Post vital signs: stable  Last Vitals:  Vitals Value Taken Time  BP 146/75 09/18/20 2016  Temp    Pulse 110 09/18/20 2019  Resp 18 09/18/20 2019  SpO2 93 % 09/18/20 2019  Vitals shown include unvalidated device data.  Last Pain:  Vitals:   09/18/20 1233  TempSrc: Oral  PainSc: 0-No pain      Patients Stated Pain Goal: 0 (05/39/76 7341)  Complications: No notable events documented.

## 2020-09-18 NOTE — Anesthesia Preprocedure Evaluation (Addendum)
Anesthesia Evaluation  Patient identified by MRN, date of birth, ID band Patient awake    Reviewed: Allergy & Precautions, NPO status , Patient's Chart, lab work & pertinent test results  History of Anesthesia Complications Negative for: history of anesthetic complications  Airway Mallampati: III       Dental no notable dental hx.    Pulmonary neg sleep apnea, neg COPD, Not current smoker, former smoker (quite one week ago),    Pulmonary exam normal        Cardiovascular Exercise Tolerance: Good hypertension, Pt. on medications (-) Past MI and (-) CHF Normal cardiovascular exam(-) dysrhythmias (-) Valvular Problems/Murmurs Rhythm:Regular Rate:Normal     Neuro/Psych neg Seizures PSYCHIATRIC DISORDERS Depression    GI/Hepatic Neg liver ROS, GERD  Medicated and Controlled,  Endo/Other  neg diabetesHypothyroidism   Renal/GU negative Renal ROS     Musculoskeletal  (+) Arthritis ,   Abdominal (+) + obese,   Peds  Hematology   Anesthesia Other Findings   Reproductive/Obstetrics                            Anesthesia Physical  Anesthesia Plan  ASA: 3  Anesthesia Plan: General   Post-op Pain Management:    Induction: Intravenous  PONV Risk Score and Plan: 2 and Ondansetron and Dexamethasone  Airway Management Planned: Oral ETT  Additional Equipment:   Intra-op Plan:   Post-operative Plan:   Informed Consent: I have reviewed the patients History and Physical, chart, labs and discussed the procedure including the risks, benefits and alternatives for the proposed anesthesia with the patient or authorized representative who has indicated his/her understanding and acceptance.     Dental advisory given  Plan Discussed with: Anesthesiologist and Surgeon  Anesthesia Plan Comments:       Anesthesia Quick Evaluation

## 2020-09-18 NOTE — Anesthesia Procedure Notes (Signed)
Procedure Name: Intubation Date/Time: 09/18/2020 1:45 PM Performed by: Willette Alma, CRNA Pre-anesthesia Checklist: Patient identified, Patient being monitored, Timeout performed, Emergency Drugs available and Suction available Patient Re-evaluated:Patient Re-evaluated prior to induction Oxygen Delivery Method: Circle system utilized Preoxygenation: Pre-oxygenation with 100% oxygen Induction Type: IV induction Ventilation: Mask ventilation without difficulty Laryngoscope Size: 4 and McGraph Grade View: Grade I Tube type: Oral Tube size: 8.0 mm Number of attempts: 1 Airway Equipment and Method: Stylet Placement Confirmation: ETT inserted through vocal cords under direct vision, positive ETCO2 and breath sounds checked- equal and bilateral Secured at: 23 cm Tube secured with: Tape Dental Injury: Teeth and Oropharynx as per pre-operative assessment

## 2020-09-18 NOTE — Progress Notes (Signed)
Report given to Manuela Schwartz, RN. Patient is currently in [place].

## 2020-09-19 ENCOUNTER — Encounter: Payer: Self-pay | Admitting: Surgery

## 2020-09-19 LAB — CBC
HCT: 45.8 % (ref 39.0–52.0)
Hemoglobin: 15.8 g/dL (ref 13.0–17.0)
MCH: 30.4 pg (ref 26.0–34.0)
MCHC: 34.5 g/dL (ref 30.0–36.0)
MCV: 88.1 fL (ref 80.0–100.0)
Platelets: 319 10*3/uL (ref 150–400)
RBC: 5.2 MIL/uL (ref 4.22–5.81)
RDW: 15.3 % (ref 11.5–15.5)
WBC: 19.3 10*3/uL — ABNORMAL HIGH (ref 4.0–10.5)
nRBC: 0 % (ref 0.0–0.2)

## 2020-09-19 LAB — BASIC METABOLIC PANEL
Anion gap: 8 (ref 5–15)
BUN: 11 mg/dL (ref 6–20)
CO2: 25 mmol/L (ref 22–32)
Calcium: 8.4 mg/dL — ABNORMAL LOW (ref 8.9–10.3)
Chloride: 104 mmol/L (ref 98–111)
Creatinine, Ser: 0.69 mg/dL (ref 0.61–1.24)
GFR, Estimated: >60 mL/min (ref >60)
Glucose, Bld: 150 mg/dL — ABNORMAL HIGH (ref 70–99)
Potassium: 3.9 mmol/L (ref 3.5–5.1)
Sodium: 137 mmol/L (ref 135–145)

## 2020-09-19 MED ORDER — OXYCODONE HCL 5 MG PO TABS
15.0000 mg | ORAL_TABLET | ORAL | Status: DC | PRN
  Administered 2020-09-19 – 2020-09-23 (×19): 15 mg via ORAL
  Filled 2020-09-19 (×20): qty 3

## 2020-09-19 MED ORDER — ONDANSETRON HCL 4 MG/2ML IJ SOLN
INTRAMUSCULAR | Status: AC
  Filled 2020-09-19: qty 2

## 2020-09-19 MED ORDER — CYCLOBENZAPRINE HCL 5 MG PO TABS
7.5000 mg | ORAL_TABLET | Freq: Three times a day (TID) | ORAL | Status: DC
  Administered 2020-09-19 – 2020-09-21 (×9): 7.5 mg via ORAL
  Filled 2020-09-19 (×10): qty 1.5

## 2020-09-19 MED ORDER — KETOROLAC TROMETHAMINE 30 MG/ML IJ SOLN
30.0000 mg | Freq: Four times a day (QID) | INTRAMUSCULAR | Status: DC
  Administered 2020-09-19 – 2020-09-23 (×15): 30 mg via INTRAVENOUS
  Filled 2020-09-19 (×15): qty 1

## 2020-09-19 MED ORDER — DEXAMETHASONE SODIUM PHOSPHATE 10 MG/ML IJ SOLN
INTRAMUSCULAR | Status: AC
  Filled 2020-09-19: qty 1

## 2020-09-19 NOTE — Progress Notes (Signed)
[facility] Hospital Day(s): 0.   Post op day(s): 1 Day Post-Op.   Interval History:  Patient seen and examined Overnight, he had issues with pain control  Patient reports he continues to have relatively generalized abdominal pain; 10/10; worse with movement No fever, chills, nausea, emesis Does have a spike in leukocytosis to 19.3K; likely reactive from OR Renal function remains normal; sCr - 0.69; UO - 950 ccs + unmeasured No electrolyte derangements  Surgical drains in place:   - LLQ (Posterior to fascia / anterior to mesh): 92 ccs; old blood/serosanguinous  - RLQ (Posterior to fascia / anterior to mesh): 120 ccs:   - LUQ (Anterior to fascia): 47 ccs;  old blood/serosanguinous NPO since surgery; no flatus Has not been OOB  Vital signs in last 24 hours: [min-max] current  Temp:  [97.2 F (36.2 C)-98.9 F (37.2 C)] 98.9 F (37.2 C) (07/22 0651) Pulse Rate:  [59-123] 116 (07/22 0651) Resp:  [9-24] 24 (07/22 0651) BP: (134-169)/(75-115) 153/93 (07/22 0651) SpO2:  [90 %-99 %] 99 % (07/22 0651) Weight:  [97.1 kg] 97.1 kg (07/21 1233)     Height: '5\' 7"'$  (170.2 cm) Weight: 97.1 kg BMI (Calculated): 33.51   Intake/Output last 2 shifts:  07/21 0701 - 07/22 0700 In: 4063.4 [P.O.:60; I.V.:3778.4; IV Piggyback:225] Out: 1550 [Urine:950; Drains:250; Blood:200]   Physical Exam:  Constitutional: alert, cooperative, appears uncomfortable Respiratory: breathing non-labored at rest  Cardiovascular: regular rate and sinus rhythm  Gastrointestinal: Soft, diffuse soreness, no rebound/guarding. Surgical drains x3 (LUQ, LLQ, RLQ) all with old blood / serosanguinous output Integumentary: Midline wound with Prevena in place; good seal   Labs:  CBC Latest Ref Rng & Units 09/19/2020 09/17/2020 06/28/2019  WBC 4.0 - 10.5 K/uL 19.3(H) 11.1(H) 15.9(H)  Hemoglobin 13.0 - 17.0 g/dL 15.8 16.4 14.3  Hematocrit 39.0 - 52.0 % 45.8 49.0 44.0  Platelets 150 - 400  K/uL 319 323 399   CMP Latest Ref Rng & Units 09/19/2020 09/17/2020 09/21/2019  Glucose 70 - 99 mg/dL 150(H) 126(H) 111(H)  BUN 6 - 20 mg/dL '11 11 14  '$ Creatinine 0.61 - 1.24 mg/dL 0.69 0.60(L) 0.84  Sodium 135 - 145 mmol/L 137 139 140  Potassium 3.5 - 5.1 mmol/L 3.9 4.0 4.9  Chloride 98 - 111 mmol/L 104 102 100  CO2 22 - 32 mmol/L '25 26 28  '$ Calcium 8.9 - 10.3 mg/dL 8.4(L) 9.1 9.8  Total Protein 6.5 - 8.1 g/dL - 6.9 -  Total Bilirubin 0.3 - 1.2 mg/dL - 0.9 -  Alkaline Phos 38 - 126 U/L - 94 -  AST 15 - 41 U/L - 19 -  ALT 0 - 44 U/L - 24 -     Imaging studies: No new pertinent imaging studies   Assessment/Plan:  45 y.o. male with post-operative pain control issues 1 Day Post-Op s/p abdominal wall reconstruction   - Okay to advance to CLD; continue IVF   - Discontinue foley catheter today  - Continue surgical drains; monitor and record output  - Continue Prevena vac; Day 1/7  - Monitor abdominal examination; on-going bowel function - Pain control prn (added flexeril); antiemetics prn - Monitor leukocytosis; likely reactive   - Mobilization as tolerated; low threshold to engage PT if needed   All of the above findings and recommendations were discussed with the patient, and the medical team, and all of patient's questions were answered to his expressed satisfaction.  -- Edison Simon, PA-C [facility] 09/19/2020,  7:19 AM [phone] M-F: 7am - 4pm

## 2020-09-19 NOTE — Progress Notes (Signed)
   09/19/20 0453 09/19/20 0600  Assess: MEWS Score  Temp 98.6 F (37 C)  --   BP 139/90  --   Pulse Rate (!) 123  --   Resp 16  --   SpO2 93 %  --   O2 Device Nasal Cannula  --   O2 Flow Rate (L/min) 2 L/min  --   Assess: MEWS Score  MEWS Temp 0  --   MEWS Systolic 0  --   MEWS Pulse 2  --   MEWS RR 0  --   MEWS LOC 1  --   MEWS Score 3  --   MEWS Score Color Yellow  --   Assess: if the MEWS score is Yellow or Red  Were vital signs taken at a resting state? Yes  --   Focused Assessment No change from prior assessment  --   Does the patient meet 2 or more of the SIRS criteria? No  --   MEWS guidelines implemented *See Row Information* Yes  --   Treat  MEWS Interventions Administered prn meds/treatments  --   Pain Scale 0-10  --   Pain Score 8  --   Pain Type Surgical pain  --   Pain Frequency Constant  --   Pain Onset On-going  --   Patients Stated Pain Goal 2  --   Pain Intervention(s) Medication (See eMAR)  --   Take Vital Signs  Increase Vital Sign Frequency  Yellow: Q 2hr X 2 then Q 4hr X 2, if remains yellow, continue Q 4hrs  --   Escalate  MEWS: Escalate Yellow: discuss with charge nurse/RN and consider discussing with provider and RRT  --   Notify: Charge Nurse/RN  Name of Charge Nurse/RN Notified Phoebe Sharps RN  --   Date Charge Nurse/RN Notified 09/19/20  --   Time Charge Nurse/RN Notified 0500  --   Document  Patient Outcome  --  Other (Comment) (Continuing to monitor HR)  Assess: SIRS CRITERIA  SIRS Temperature  0  --   SIRS Pulse 1  --   SIRS Respirations  0  --   SIRS WBC 0  --   SIRS Score Sum  1  --

## 2020-09-20 DIAGNOSIS — Z9081 Acquired absence of spleen: Secondary | ICD-10-CM | POA: Diagnosis not present

## 2020-09-20 DIAGNOSIS — Z79899 Other long term (current) drug therapy: Secondary | ICD-10-CM | POA: Diagnosis not present

## 2020-09-20 DIAGNOSIS — K219 Gastro-esophageal reflux disease without esophagitis: Secondary | ICD-10-CM | POA: Diagnosis present

## 2020-09-20 DIAGNOSIS — Z79890 Hormone replacement therapy (postmenopausal): Secondary | ICD-10-CM | POA: Diagnosis not present

## 2020-09-20 DIAGNOSIS — K430 Incisional hernia with obstruction, without gangrene: Secondary | ICD-10-CM | POA: Diagnosis present

## 2020-09-20 DIAGNOSIS — K435 Parastomal hernia without obstruction or  gangrene: Secondary | ICD-10-CM | POA: Diagnosis present

## 2020-09-20 DIAGNOSIS — I10 Essential (primary) hypertension: Secondary | ICD-10-CM | POA: Diagnosis present

## 2020-09-20 DIAGNOSIS — K56609 Unspecified intestinal obstruction, unspecified as to partial versus complete obstruction: Secondary | ICD-10-CM | POA: Diagnosis present

## 2020-09-20 DIAGNOSIS — Z885 Allergy status to narcotic agent status: Secondary | ICD-10-CM | POA: Diagnosis not present

## 2020-09-20 DIAGNOSIS — Z720 Tobacco use: Secondary | ICD-10-CM | POA: Diagnosis not present

## 2020-09-20 DIAGNOSIS — K660 Peritoneal adhesions (postprocedural) (postinfection): Secondary | ICD-10-CM | POA: Diagnosis present

## 2020-09-20 DIAGNOSIS — E039 Hypothyroidism, unspecified: Secondary | ICD-10-CM | POA: Diagnosis present

## 2020-09-20 DIAGNOSIS — Z20822 Contact with and (suspected) exposure to covid-19: Secondary | ICD-10-CM | POA: Diagnosis present

## 2020-09-20 DIAGNOSIS — D72829 Elevated white blood cell count, unspecified: Secondary | ICD-10-CM | POA: Diagnosis present

## 2020-09-20 LAB — CBC
HCT: 40.4 % (ref 39.0–52.0)
Hemoglobin: 14.1 g/dL (ref 13.0–17.0)
MCH: 31.7 pg (ref 26.0–34.0)
MCHC: 34.9 g/dL (ref 30.0–36.0)
MCV: 90.8 fL (ref 80.0–100.0)
Platelets: 272 10*3/uL (ref 150–400)
RBC: 4.45 MIL/uL (ref 4.22–5.81)
RDW: 15.6 % — ABNORMAL HIGH (ref 11.5–15.5)
WBC: 20.4 10*3/uL — ABNORMAL HIGH (ref 4.0–10.5)
nRBC: 0 % (ref 0.0–0.2)

## 2020-09-20 LAB — BASIC METABOLIC PANEL
Anion gap: 6 (ref 5–15)
BUN: 12 mg/dL (ref 6–20)
CO2: 26 mmol/L (ref 22–32)
Calcium: 8.4 mg/dL — ABNORMAL LOW (ref 8.9–10.3)
Chloride: 104 mmol/L (ref 98–111)
Creatinine, Ser: 0.62 mg/dL (ref 0.61–1.24)
GFR, Estimated: >60 mL/min (ref >60)
Glucose, Bld: 122 mg/dL — ABNORMAL HIGH (ref 70–99)
Potassium: 4.2 mmol/L (ref 3.5–5.1)
Sodium: 136 mmol/L (ref 135–145)

## 2020-09-20 NOTE — Progress Notes (Signed)
Subjective:  CC: Brandon Mullen is a 45 y.o. male  Hospital stay day 0, 2 Days Post-Op incisional hernia repair  HPI: No acute issues overnight.  Passing flatus, still having some discomfort but manageable  ROS:  General: Denies weight loss, weight gain, fatigue, fevers, chills, and night sweats. Heart: Denies chest pain, palpitations, racing heart, irregular heartbeat, leg pain or swelling, and decreased activity tolerance. Respiratory: Denies breathing difficulty, shortness of breath, wheezing, cough, and sputum. GI: Denies change in appetite, heartburn, nausea, vomiting, constipation, diarrhea, and blood in stool. GU: Denies difficulty urinating, pain with urinating, urgency, frequency, blood in urine.   Objective:   Temp:  [98.1 F (36.7 C)-98.7 F (37.1 C)] 98.1 F (36.7 C) (07/23 0826) Pulse Rate:  [98-114] 103 (07/23 0826) Resp:  [16-20] 20 (07/23 0826) BP: (122-125)/(79-88) 122/79 (07/23 0826) SpO2:  [92 %-95 %] 94 % (07/23 0826)     Height: '5\' 7"'$  (123456 cm) Weight: 97.1 kg BMI (Calculated): 33.51   Intake/Output this shift:   Intake/Output Summary (Last 24 hours) at 09/20/2020 1135 Last data filed at 09/20/2020 1032 Gross per 24 hour  Intake 920 ml  Output 2315 ml  Net -1395 ml    Constitutional :  alert, cooperative, appears stated age, and no distress  Respiratory:  clear to auscultation bilaterally  Cardiovascular:  regular rate and rhythm  Gastrointestinal: Soft, no guarding, but tenderness to palpation in all 4 quadrants JP with old bloody discharge .   Skin: Cool and moist.  Renal wound VAC intact.  Psychiatric: Normal affect, non-agitated, not confused       LABS:  CMP Latest Ref Rng & Units 09/20/2020 09/19/2020 09/17/2020  Glucose 70 - 99 mg/dL 122(H) 150(H) 126(H)  BUN 6 - 20 mg/dL '12 11 11  '$ Creatinine 0.61 - 1.24 mg/dL 0.62 0.69 0.60(L)  Sodium 135 - 145 mmol/L 136 137 139  Potassium 3.5 - 5.1 mmol/L 4.2 3.9 4.0  Chloride 98 - 111 mmol/L 104 104 102   CO2 22 - 32 mmol/L '26 25 26  '$ Calcium 8.9 - 10.3 mg/dL 8.4(L) 8.4(L) 9.1  Total Protein 6.5 - 8.1 g/dL - - 6.9  Total Bilirubin 0.3 - 1.2 mg/dL - - 0.9  Alkaline Phos 38 - 126 U/L - - 94  AST 15 - 41 U/L - - 19  ALT 0 - 44 U/L - - 24   CBC Latest Ref Rng & Units 09/20/2020 09/19/2020 09/17/2020  WBC 4.0 - 10.5 K/uL 20.4(H) 19.3(H) 11.1(H)  Hemoglobin 13.0 - 17.0 g/dL 14.1 15.8 16.4  Hematocrit 39.0 - 52.0 % 40.4 45.8 49.0  Platelets 150 - 400 K/uL 272 319 323    RADS: N/a Assessment:   Status post incisional hernia repair with abdominal wall reconstruction.  Slowly recovering as expected.  To the degree of pain today, and lack of a bowel movement, will continue with clear liquid diet for another day just to be on the safe side.  Continue to monitor drainage output as it is still looking fairly bloody.  Hemoglobin remains well within normal limits.

## 2020-09-21 LAB — CBC WITH DIFFERENTIAL/PLATELET
Abs Immature Granulocytes: 0.16 10*3/uL — ABNORMAL HIGH (ref 0.00–0.07)
Basophils Absolute: 0.1 10*3/uL (ref 0.0–0.1)
Basophils Relative: 0 %
Eosinophils Absolute: 0.6 10*3/uL — ABNORMAL HIGH (ref 0.0–0.5)
Eosinophils Relative: 3 %
HCT: 37.9 % — ABNORMAL LOW (ref 39.0–52.0)
Hemoglobin: 13 g/dL (ref 13.0–17.0)
Immature Granulocytes: 1 %
Lymphocytes Relative: 14 %
Lymphs Abs: 2.7 10*3/uL (ref 0.7–4.0)
MCH: 31.3 pg (ref 26.0–34.0)
MCHC: 34.3 g/dL (ref 30.0–36.0)
MCV: 91.1 fL (ref 80.0–100.0)
Monocytes Absolute: 2.1 10*3/uL — ABNORMAL HIGH (ref 0.1–1.0)
Monocytes Relative: 10 %
Neutro Abs: 14.5 10*3/uL — ABNORMAL HIGH (ref 1.7–7.7)
Neutrophils Relative %: 72 %
Platelets: 300 10*3/uL (ref 150–400)
RBC: 4.16 MIL/uL — ABNORMAL LOW (ref 4.22–5.81)
RDW: 15.6 % — ABNORMAL HIGH (ref 11.5–15.5)
WBC: 20.1 10*3/uL — ABNORMAL HIGH (ref 4.0–10.5)
nRBC: 0 % (ref 0.0–0.2)

## 2020-09-21 NOTE — Progress Notes (Signed)
Subjective:  CC: Brandon Mullen is a 45 y.o. male  Hospital stay day 1, 3 Days Post-Op incisional hernia repair  HPI: No acute issues overnight.  Still passing flatus, pain slowly improving. ROS:  General: Denies weight loss, weight gain, fatigue, fevers, chills, and night sweats. Heart: Denies chest pain, palpitations, racing heart, irregular heartbeat, leg pain or swelling, and decreased activity tolerance. Respiratory: Denies breathing difficulty, shortness of breath, wheezing, cough, and sputum. GI: Denies change in appetite, heartburn, nausea, vomiting, constipation, diarrhea, and blood in stool. GU: Denies difficulty urinating, pain with urinating, urgency, frequency, blood in urine.   Objective:   Temp:  [97.9 F (36.6 C)-98.4 F (36.9 C)] 98.1 F (36.7 C) (07/24 0355) Pulse Rate:  [81-98] 81 (07/24 0355) Resp:  [16-20] 20 (07/24 0355) BP: (115-131)/(77-81) 115/77 (07/24 0355) SpO2:  [93 %-100 %] 100 % (07/24 0355)     Height: '5\' 7"'$  (170.2 cm) Weight: 97.1 kg BMI (Calculated): 33.51   Intake/Output this shift:   Intake/Output Summary (Last 24 hours) at 09/21/2020 1259 Last data filed at 09/21/2020 0900 Gross per 24 hour  Intake --  Output 1450 ml  Net -1450 ml    Constitutional :  alert, cooperative, appears stated age, and no distress  Respiratory:  clear to auscultation bilaterally  Cardiovascular:  regular rate and rhythm  Gastrointestinal: Soft, no guarding, but tenderness to palpation slight improvement in all 4 quadrants JPwith more serosanguineous discharge today .   Skin: Cool and moist.  Prevena wound VAC intact.  Psychiatric: Normal affect, non-agitated, not confused       LABS:  CMP Latest Ref Rng & Units 09/20/2020 09/19/2020 09/17/2020  Glucose 70 - 99 mg/dL 122(H) 150(H) 126(H)  BUN 6 - 20 mg/dL '12 11 11  '$ Creatinine 0.61 - 1.24 mg/dL 0.62 0.69 0.60(L)  Sodium 135 - 145 mmol/L 136 137 139  Potassium 3.5 - 5.1 mmol/L 4.2 3.9 4.0  Chloride 98 - 111  mmol/L 104 104 102  CO2 22 - 32 mmol/L '26 25 26  '$ Calcium 8.9 - 10.3 mg/dL 8.4(L) 8.4(L) 9.1  Total Protein 6.5 - 8.1 g/dL - - 6.9  Total Bilirubin 0.3 - 1.2 mg/dL - - 0.9  Alkaline Phos 38 - 126 U/L - - 94  AST 15 - 41 U/L - - 19  ALT 0 - 44 U/L - - 24   CBC Latest Ref Rng & Units 09/21/2020 09/20/2020 09/19/2020  WBC 4.0 - 10.5 K/uL 20.1(H) 20.4(H) 19.3(H)  Hemoglobin 13.0 - 17.0 g/dL 13.0 14.1 15.8  Hematocrit 39.0 - 52.0 % 37.9(L) 40.4 45.8  Platelets 150 - 400 K/uL 300 272 319    RADS: N/a Assessment:   Status post incisional hernia repair with abdominal wall reconstruction.  Slowly recovering as expected.  Still no bowel movement and patient abdomen visually still looks distended.  We will continue clear liquid diet due to the persistent leukocytosis as well.  No sign of obvious infection and drain has serosanguineous output now.  Will advance diet once he has an actual bowel movement.  Encouraging gum hard candy and ambulation until then.  Hemoglobin is slowly downtrending but output less sanguinous today.  Can continue DVT prophylaxis and monitor for now.

## 2020-09-22 LAB — CBC
HCT: 37.2 % — ABNORMAL LOW (ref 39.0–52.0)
Hemoglobin: 12.5 g/dL — ABNORMAL LOW (ref 13.0–17.0)
MCH: 30.6 pg (ref 26.0–34.0)
MCHC: 33.6 g/dL (ref 30.0–36.0)
MCV: 91 fL (ref 80.0–100.0)
Platelets: 340 10*3/uL (ref 150–400)
RBC: 4.09 MIL/uL — ABNORMAL LOW (ref 4.22–5.81)
RDW: 15.5 % (ref 11.5–15.5)
WBC: 13 10*3/uL — ABNORMAL HIGH (ref 4.0–10.5)
nRBC: 0 % (ref 0.0–0.2)

## 2020-09-22 LAB — BASIC METABOLIC PANEL
Anion gap: 5 (ref 5–15)
BUN: 11 mg/dL (ref 6–20)
CO2: 29 mmol/L (ref 22–32)
Calcium: 8.3 mg/dL — ABNORMAL LOW (ref 8.9–10.3)
Chloride: 100 mmol/L (ref 98–111)
Creatinine, Ser: 0.74 mg/dL (ref 0.61–1.24)
GFR, Estimated: >60 mL/min (ref >60)
Glucose, Bld: 95 mg/dL (ref 70–99)
Potassium: 4.1 mmol/L (ref 3.5–5.1)
Sodium: 134 mmol/L — ABNORMAL LOW (ref 135–145)

## 2020-09-22 LAB — SURGICAL PATHOLOGY

## 2020-09-22 MED ORDER — PREGABALIN 75 MG PO CAPS
200.0000 mg | ORAL_CAPSULE | Freq: Three times a day (TID) | ORAL | Status: DC
  Administered 2020-09-22 – 2020-09-23 (×4): 200 mg via ORAL
  Filled 2020-09-22 (×2): qty 1
  Filled 2020-09-22: qty 2
  Filled 2020-09-22: qty 1

## 2020-09-22 MED ORDER — CYCLOBENZAPRINE HCL 10 MG PO TABS
10.0000 mg | ORAL_TABLET | Freq: Three times a day (TID) | ORAL | Status: DC | PRN
  Administered 2020-09-23: 10 mg via ORAL

## 2020-09-22 MED ORDER — ENSURE ENLIVE PO LIQD
237.0000 mL | Freq: Two times a day (BID) | ORAL | Status: DC
  Administered 2020-09-22 – 2020-09-23 (×3): 237 mL via ORAL

## 2020-09-22 NOTE — Progress Notes (Signed)
[place] [facility] Day(s): 2.   Post op day(s): 4 Days Post-Op.   Interval History:  Patient seen and examined No acute events or new complaints overnight.  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 which may explain the persistent elevation Surgical drains in place:             - LLQ (Posterior to fascia / anterior to mesh): 70 ccs; serosanguinous             - RLQ (Posterior to fascia / anterior to mesh): 50 [id number]             - LUQ (Anterior to fascia): 18 ccs; serosanguinous Advanced to CLD; tolerating well; feels hungry He is passing lots of flatus and had a BM Ambulating  Vital signs in last 24 hours: [min-max] current  Temp:  [97.4 F (36.3 C)-98.4 F (36.9 C)] 97.4 F (36.3 C) (07/25 0442) Pulse Rate:  [79-84] 79 (07/25 0442) Resp:  [17-18] 17 (07/25 0442) BP: (103-121)/(62-84) 115/81 (07/25 0442) SpO2:  [97 %-99 %] 97 % (07/25 0442)     Height: '5\' 7"'$  (170.2 cm) Weight: 97.1 kg BMI (Calculated): 33.51   Intake/Output last 2 shifts:  07/24 0701 - 07/25 0700 In: -  Out: 138 [Drains:138]   Physical Exam:  Constitutional: alert, cooperative, appears uncomfortable Respiratory: breathing non-labored at rest Cardiovascular: regular rate and sinus rhythm Gastrointestinal: Soft, diffuse soreness, no rebound/guarding. Surgical drains x3 (LUQ, LLQ, RLQ) all serosanguinous  Integumentary: Midline wound with Prevena in place; good seal     Labs:  CBC Latest Ref Rng & Units 09/21/2020 09/20/2020 09/19/2020  WBC 4.0 - 10.5 K/uL 20.1(H) 20.4(H) 19.3(H)  Hemoglobin 13.0 - 17.0 g/dL 13.0 14.1 15.8  Hematocrit 39.0 - 52.0 % 37.9(L) 40.4 45.8  Platelets 150 - 400 K/uL 300 272 319   CMP Latest Ref Rng & Units  09/20/2020 09/19/2020 09/17/2020  Glucose 70 - 99 mg/dL 122(H) 150(H) 126(H)  BUN 6 - 20 mg/dL '12 11 11  '$ Creatinine 0.61 - 1.24 mg/dL 0.62 0.69 0.60(L)  Sodium 135 - 145 mmol/L 136 137 139  Potassium 3.5 - 5.1 mmol/L 4.2 3.9 4.0  Chloride 98 - 111 mmol/L 104 104 102  CO2 22 - 32 mmol/L '26 25 26  '$ Calcium 8.9 - 10.3 mg/dL 8.4(L) 8.4(L) 9.1  Total Protein 6.5 - 8.1 g/dL - - 6.9  Total Bilirubin 0.3 - 1.2 mg/dL - - 0.9  Alkaline Phos 38 - 126 U/L - - 94  AST 15 - 41 U/L - - 19  ALT 0 - 44 U/L - - 24     Imaging studies: No new pertinent imaging studies   Assessment/Plan:  45 y.o. male with ROBF 4 Days Post-Op s/p abdominal wall reconstruction   - Will trial on advancement to full liquid diet; add Ensure  - Continue surgical drains; monitor and record output             - Continue Prevena vac; Day 4/7             - Monitor abdominal examination; on-going bowel function - Pain control prn (increased flexeril to 10 TID prn); antiemetics prn - Monitor leukocytosis; likely reactive + history of splenectomy             -  Mobilization as tolerated; will engage PT to ensure no home health needs  All of the above findings and recommendations were discussed with the patient, patient's family, and the medical team, and all of patient's questions were answered to his expressed satisfaction.  -- Edison Simon, PA-C [facility] 09/22/2020, 7:23 AM [phone] M-F: 7am - 4pm

## 2020-09-22 NOTE — Evaluation (Signed)
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  s/p abdominal wall reconstruction with incisional hernia repair with wound vac placement (09/18/20).  Clinical Impression  Patient sitting in recliner upon arrival to room; alert and oriented, follows commands and agreeable to session.  Does endorse some indep mobility in room prior to evaluation.  Endorses pain 8/10 in abdominal area, but "tolerable".  Bilat UE/LE strength and ROM grossly symmetrical and WFL; no focal weakness appreciated.  Able to complete sit/stand, basic transfers and gait (400') without assist device, mod indep.  Demonstrates reciprocal stepping pattern with good step height/length; fair/good trunk rotation and arm swing; fair cadence.  No buckling, LOB or safety concerns.  Good safety awareness, insight; appropriate use of compensatory strategies and activity monitoring noted during session. Appears to be at baseline level of functional ability; no acute PT needs identified at this time. Okay for discharge home with family as medically appropriate.  Will complete initial order; please re-consult should needs change.    Follow Up Recommendations No PT follow up    Equipment Recommendations       Recommendations for Other Services       Precautions / Restrictions Precautions Precautions: Fall Precaution Comments: Jp drain x3 Restrictions Weight Bearing Restrictions: No      Mobility  Bed Mobility               General bed mobility comments: seated in chair beginning/end of treatment session    Transfers Overall transfer level: Modified independent Equipment used: None             General transfer comment: good LE strength; excessive hip abduct/ER due  to abdominal swelling  Ambulation/Gait Ambulation/Gait assistance: Modified independent (Device/Increase time) Gait Distance (Feet): 400 Feet Assistive device: None       General Gait Details: reciprocal stepping pattern with good step height/length; fair/good trunk rotation and arm swing; fair cadence.  No buckling, LOB or safety concerns  Stairs            Wheelchair Mobility    Modified Rankin (Stroke Patients Only)       Balance Overall balance assessment: Modified Independent                                           Pertinent Vitals/Pain Pain Assessment: 0-10 Pain Score: 8  Pain Location: abdomen Pain Descriptors / Indicators: Sore Pain Intervention(s): Monitored during session;Repositioned;Limited activity within patient's tolerance    Home Living Family/patient expects to be discharged to:: Private residence Living Arrangements: Parent Available Help at Discharge: Family Type of Home: House Home Access: Stairs to enter   Technical brewer of Steps: 2 Home Layout: One level Home Equipment: None      Prior Function Level of Independence: Independent         Comments: Indep with ADLs, household and community mobilization upon discharge; 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, newspaper        Extremity/Trunk Assessment   Upper Extremity Assessment Upper Extremity Assessment: Overall WFL for tasks assessed    Lower Extremity Assessment Lower Extremity Assessment: Overall WFL for tasks assessed       Communication  Communication: No difficulties  Cognition Arousal/Alertness: Awake/alert Behavior During Therapy: WFL for tasks assessed/performed Overall Cognitive Status: Within Functional Limits for tasks assessed                                        General Comments General comments (skin integrity, edema, etc.): able to retrieve item from floor, using external surface to stabilize as  needed; good safety awareness/insight    Exercises Other Exercises Other Exercises: Educated in mechanics, safety with activity; role of activity pacing and progressive endurance/mobility training.  Patient voiced understanding and awareness.   Assessment/Plan    PT Assessment Patent does not need any further PT services  PT Problem List         PT Treatment Interventions      PT Goals (Current goals can be found in the Care Plan section)  Acute Rehab PT Goals Patient Stated Goal: to return home PT Goal Formulation: All assessment and education complete, DC therapy    Frequency     Barriers to discharge        Co-evaluation               AM-PAC PT "6 Clicks" Mobility  Outcome Measure Help needed turning from your back to your side while in a flat bed without using bedrails?: None Help needed moving from lying on your back to sitting on the side of a flat bed without using bedrails?: None Help needed moving to and from a bed to a chair (including a wheelchair)?: None Help needed standing up from a chair using your arms (e.g., wheelchair or bedside chair)?: None Help needed to walk in hospital room?: None Help needed climbing 3-5 steps with a railing? : None 6 Click Score: 24    End of Session   Activity Tolerance: Patient tolerated treatment well Patient left: in chair;with call bell/phone within reach   PT Visit Diagnosis: Difficulty in walking, not elsewhere classified (R26.2)    Time: [id number] PT Time Calculation (min) (ACUTE ONLY): 10 min   Charges:   PT Evaluation $PT Eval Moderate Complexity: 1 Mod          Retia Cordle H. Owens Shark, PT, DPT, NCS 09/22/20, 4:13 PM [phone]

## 2020-09-23 MED ORDER — PREGABALIN 200 MG PO CAPS: 200.0000 mg | ORAL_CAPSULE | Freq: Three times a day (TID) | ORAL | 0 refills | Status: DC

## 2020-09-23 MED ORDER — CYCLOBENZAPRINE HCL 10 MG PO TABS: 10.0000 mg | ORAL_TABLET | Freq: Three times a day (TID) | ORAL | 0 refills | Status: DC | PRN

## 2020-09-23 MED ORDER — OXYCODONE-ACETAMINOPHEN 10-325 MG PO TABS: 1.0000 | ORAL_TABLET | ORAL | 0 refills | Status: DC | PRN

## 2020-09-23 MED ORDER — IBUPROFEN 600 MG PO TABS: 600.0000 mg | ORAL_TABLET | Freq: Four times a day (QID) | ORAL | 0 refills | Status: DC | PRN

## 2020-09-23 NOTE — Plan of Care (Signed)

## 2020-09-23 NOTE — Plan of Care (Signed)
  Problem: Education: Goal: Knowledge of General Education information will improve Description: Including pain rating scale, medication(s)/side effects and non-pharmacologic comfort measures 09/23/2020 1751 by Orvan Seen, RN Outcome: Completed/Met 09/23/2020 1751 by Orvan Seen, RN Outcome: Progressing   Problem: Health Behavior/Discharge Planning: Goal: Ability to manage health-related needs will improve 09/23/2020 1751 by Orvan Seen, RN Outcome: Completed/Met 09/23/2020 1751 by Orvan Seen, RN Outcome: Progressing   Problem: Clinical Measurements: Goal: Ability to maintain clinical measurements within normal limits will improve 09/23/2020 1751 by Orvan Seen, RN Outcome: Completed/Met 09/23/2020 1751 by Orvan Seen, RN Outcome: Progressing Goal: Will remain free from infection 09/23/2020 1751 by Orvan Seen, RN Outcome: Completed/Met 09/23/2020 1751 by Orvan Seen, RN Outcome: Progressing Goal: Diagnostic test results will improve 09/23/2020 1751 by Orvan Seen, RN Outcome: Completed/Met 09/23/2020 1751 by Orvan Seen, RN Outcome: Progressing Goal: Respiratory complications will improve 09/23/2020 1751 by Orvan Seen, RN Outcome: Completed/Met 09/23/2020 1751 by Orvan Seen, RN Outcome: Progressing Goal: Cardiovascular complication will be avoided 09/23/2020 1751 by Orvan Seen, RN Outcome: Completed/Met 09/23/2020 1751 by Orvan Seen, RN Outcome: Progressing   Problem: Activity: Goal: Risk for activity intolerance will decrease 09/23/2020 1751 by Orvan Seen, RN Outcome: Completed/Met 09/23/2020 1751 by Orvan Seen, RN Outcome: Progressing   Problem: Nutrition: Goal: Adequate nutrition will be maintained 09/23/2020 1751 by Orvan Seen, RN Outcome: Completed/Met 09/23/2020 1751 by Orvan Seen, RN Outcome: Progressing   Problem: Coping: Goal: Level of anxiety will decrease 09/23/2020  1751 by Orvan Seen, RN Outcome: Completed/Met 09/23/2020 1751 by Orvan Seen, RN Outcome: Progressing   Problem: Elimination: Goal: Will not experience complications related to bowel motility 09/23/2020 1751 by Orvan Seen, RN Outcome: Completed/Met 09/23/2020 1751 by Orvan Seen, RN Outcome: Progressing Goal: Will not experience complications related to urinary retention 09/23/2020 1751 by Orvan Seen, RN Outcome: Completed/Met 09/23/2020 1751 by Orvan Seen, RN Outcome: Progressing   Problem: Pain Managment: Goal: General experience of comfort will improve 09/23/2020 1751 by Orvan Seen, RN Outcome: Completed/Met 09/23/2020 1751 by Orvan Seen, RN Outcome: Progressing   Problem: Safety: Goal: Ability to remain free from injury will improve 09/23/2020 1751 by Orvan Seen, RN Outcome: Completed/Met 09/23/2020 1751 by Orvan Seen, RN Outcome: Progressing   Problem: Skin Integrity: Goal: Risk for impaired skin integrity will decrease 09/23/2020 1751 by Orvan Seen, RN Outcome: Completed/Met 09/23/2020 1751 by Orvan Seen, RN Outcome: Progressing

## 2020-09-23 NOTE — Discharge Instructions (Signed)
In addition to included general post-operative instructions,  Diet: Resume home diet.   Activity: No heavy lifting >20 pounds (children, pets, laundry, garbage) or strenuous activity for at least 6 weeks, but light activity and walking are encouraged. Do not drive or drink alcohol if taking narcotic pain medications or having pain that might distract from driving.  Wound care: Maintain and record drain output daily; handouts given. Would hold off on showering until drains removed.   Medications: Resume all home medications. For mild to moderate pain: acetaminophen (Tylenol) or ibuprofen/naproxen (if no kidney disease). Combining Tylenol with alcohol can substantially increase your risk of causing liver disease. Narcotic pain medications, if prescribed, can be used for severe pain, though may cause nausea, constipation, and drowsiness. Do not combine Tylenol and Percocet (or similar) within a 6 hour period as Percocet (and similar) contain(s) Tylenol. If you do not need the narcotic pain medication, you do not need to fill the prescription.  Call office [phone] / [phone]) at any time if any questions, worsening pain, fevers/chills, bleeding, drainage from incision site, or other concerns.

## 2020-09-23 NOTE — Discharge Summary (Signed)
[facility] SURGICAL DISCHARGE SUMMARY  Patient ID: Brandon Mullen MRN: [id number] DOB/AGE: 09/23/1975 45 y.o.  Admit date: 09/17/2020 Discharge date: 09/23/2020  Discharge Diagnoses Ventral Hernia x 3  Consultants None  Procedures 09/18/2020 1. Abdominal wall reconstruction with bilateral myocutaneous flaps using a TAR release and a medium weight macro porus polypropylene mesh  x 2 measuring 30.5 x 30.5 cm. We used a diamond configuration for the Pelvis and the subxiphoid we placed an additional mesh in a home plate configuration., 2. Incisional hernia repair 3. Wound vac Placement 48 x 2 cm ( Prevena plus) 4. Excision of prior mesh  HPI: Brandon Mullen is a 45 y.o. male seen in consultation at the request of Dr. Joni Fears.  He is very well-known to [place] does have complex surgical history.  a history of Hartmann's for perforated diverticulitis on 12/27/2018.  He then had an open colostomy takedown on 05/03/2019 followed by a robotic assisted incisional hernia repair at the prior ostomy site on 09/25/2019. I have recently seen him for recurrent ventral hernia that has been symptomatic.  He does have loss of domain and will require an abdominal wall reconstruction.  The last few days his symptoms have worsened now has had crescendo symptoms and today presented with worsening pain that is intermittent.  No vomiting.  He does endorse decreased appetite.  He is very anxious and concerned about his hernia and potentially developing a true bowel obstruction and incarceration or strangulation.  I have personally reviewed his CT scan from this hospitalization showing evidence of 1, incarceration of the small bowel into the hernia.  Please note that there is no evidence of bowel compromise.  There is persistent ventral defect with loss of domain.  CBC and CMP are completely normal.  Hospital Course: Patient was admitted to general surgery service. Informed consent was obtained and  documented, and patient underwent uneventful abdominal wall reconstruction (Dr Dahlia Byes, 09/18/2020).  Post-operatively, patient had expected post-operative pain but otherwise did well. Advancement of patient's diet and ambulation were well-tolerated. The remainder of patient's hospital course was essentially unremarkable, and discharge planning was initiated accordingly with patient safely able to be discharged home with appropriate discharge instructions, pain control, and outpatient follow-up after all of his questions were answered to his expressed satisfaction.   Discharge Condition: Good   Physical Examination:  Constitutional: alert, cooperative, appears uncomfortable Respiratory: breathing non-labored at rest Cardiovascular: regular rate and sinus rhythm Gastrointestinal: Soft, diffuse soreness, no rebound/guarding. Surgical drains x3 (LUQ, LLQ, RLQ) all serosanguinous - Removed LUQ drain  Integumentary: Midline wound CDI with staples, no erythema or drainage    Allergies as of 09/23/2020       Reactions   Morphine And Related    cramping        Medication List     TAKE these medications    Buprenorphine HCl-Naloxone HCl 8-2 MG Film Place under the tongue 3 (three) times daily.   cyclobenzaprine 10 MG tablet Commonly known as: FLEXERIL Take 1 tablet (10 mg total) by mouth 3 (three) times daily as needed for muscle spasms.   escitalopram 10 MG tablet Commonly known as: LEXAPRO Take 10 mg by mouth daily.   gabapentin 300 MG capsule Commonly known as: NEURONTIN Take 300 mg by mouth 2 (two) times daily as needed.   ibuprofen 600 MG tablet Commonly known as: ADVIL Take 1 tablet (600 mg total) by mouth every 6 (six) hours as needed.   levothyroxine 200 MCG tablet Commonly known  as: SYNTHROID Take 200 mcg by mouth daily.   lisinopril 20 MG tablet Commonly known as: ZESTRIL Take 20 mg by mouth every morning.   nicotine 14 mg/24hr patch Commonly known as: NICODERM  CQ - dosed in mg/24 hours Place 1 patch (14 mg total) onto the skin daily.   oxyCODONE-acetaminophen 10-325 MG tablet Commonly known as: Percocet Take 1-2 tablets by mouth every 4 (four) hours as needed for pain.   pregabalin 200 MG capsule Commonly known as: LYRICA Take 1 capsule (200 mg total) by mouth 3 (three) times daily.          Follow-up Information     Pabon, [place] F, MD. Schedule an appointment as soon as possible for a visit on 10/01/2020.   Specialty: General Surgery Why: s/p Abdominal wall reconstruction Contact information: [address] [place] [address] [phone]                  Time spent on discharge management including discussion of hospital course, clinical condition, outpatient instructions, prescriptions, and follow up with the patient and members of the medical team: >30 minutes  -- Edison Simon , PA-C [facility]  09/23/2020, 8:10 AM [phone] M-F: 7am - 4pm

## 2020-09-23 NOTE — Progress Notes (Signed)
Patient discharged to home, wheeled out of unit by transport with all belongings.  VSS.  Pain well managed.  Medications and discharge instructions reviewed with patient, including JP drain care.  All questions answered.  PIV x1 removed, no bleeding, intact.  Patient verbalized understanding of signs and symptoms of infection.  Patient agreed to follow up with all appointments as listed on AVS.  Patient satisfied with overall care at [facility].

## 2020-10-01 ENCOUNTER — Other Ambulatory Visit: Payer: Self-pay

## 2020-10-01 ENCOUNTER — Ambulatory Visit (INDEPENDENT_AMBULATORY_CARE_PROVIDER_SITE_OTHER): Payer: BC Managed Care – PPO | Admitting: Surgery

## 2020-10-01 ENCOUNTER — Encounter: Payer: Self-pay | Admitting: Surgery

## 2020-10-01 VITALS — BP 127/79 | HR 102 | Temp 98.3°F | Ht 67.0 in | Wt 220.8 lb

## 2020-10-01 DIAGNOSIS — Z09 Encounter for follow-up examination after completed treatment for conditions other than malignant neoplasm: Secondary | ICD-10-CM

## 2020-10-01 MED ORDER — OXYCODONE-ACETAMINOPHEN 10-325 MG PO TABS: 1.0000 | ORAL_TABLET | ORAL | 0 refills | Status: DC | PRN

## 2020-10-01 NOTE — Patient Instructions (Signed)
We removed some of the staples today. We will remove the remainder of them next week.  You may shower as usual.Do not scrub over the steri strips. You may allow water to run over them. Pat the steri strips dry after showering.   Continue to record the drain output.   Please see your follow up appointment next week.

## 2020-10-02 ENCOUNTER — Encounter: Payer: Self-pay | Admitting: Surgery

## 2020-10-02 NOTE — Progress Notes (Signed)
Brandon Mullen is 2 weeks out status post abdominal wall reconstruction with mesh after recurrent incisional hernia with loss of domain. He is doing well.  He does have chronic pain and has run out from his Percocets. No fevers no chills.  Drains putting out serous fluid.  Less than 20 cc a day each. Taking PO and Having BM  PE Abd: Soft incision healing well with staples.  Half of the staples were removed today.  Right JP drain was removed left JP still in place with serous fluid.  There is no evidence of infection there is no evidence of recurrence there is no evidence of skin necrosis.  A/P doing well considering massive surgery with abdominal wall reconstruction.  We will give him only 1 refill of his Percocet No. 20.  He does see pain clinic in the next 24 hours. Follow-up with [place] next week for drain removal

## 2020-10-06 ENCOUNTER — Other Ambulatory Visit: Payer: Self-pay

## 2020-10-06 ENCOUNTER — Ambulatory Visit: Payer: BC Managed Care – PPO | Admitting: Surgery

## 2020-10-06 ENCOUNTER — Telehealth: Payer: Self-pay | Admitting: Surgery

## 2020-10-06 MED ORDER — CYCLOBENZAPRINE HCL 10 MG PO TABS: 10.0000 mg | ORAL_TABLET | Freq: Three times a day (TID) | ORAL | 0 refills | Status: DC | PRN

## 2020-10-06 NOTE — Telephone Encounter (Signed)
Patient is asking if he could get a refill on his muscle relax medicine, patient is using [facility] on [address]. Please call patient and advise.

## 2020-10-06 NOTE — Telephone Encounter (Signed)
Per Dr Dahlia Byes authorized to refill his Flexeril. Patient aware.

## 2020-10-07 DIAGNOSIS — K431 Incisional hernia with gangrene: Secondary | ICD-10-CM

## 2020-10-08 ENCOUNTER — Ambulatory Visit (INDEPENDENT_AMBULATORY_CARE_PROVIDER_SITE_OTHER): Payer: BC Managed Care – PPO | Admitting: Surgery

## 2020-10-08 ENCOUNTER — Encounter: Payer: Self-pay | Admitting: Surgery

## 2020-10-08 ENCOUNTER — Other Ambulatory Visit: Payer: Self-pay

## 2020-10-08 VITALS — BP 129/91 | HR 89 | Temp 98.8°F | Ht 67.0 in | Wt 217.8 lb

## 2020-10-08 DIAGNOSIS — Z09 Encounter for follow-up examination after completed treatment for conditions other than malignant neoplasm: Secondary | ICD-10-CM

## 2020-10-08 MED ORDER — PREGABALIN 200 MG PO CAPS: 200.0000 mg | ORAL_CAPSULE | Freq: Two times a day (BID) | ORAL | 0 refills | Status: DC

## 2020-10-08 NOTE — Patient Instructions (Signed)
We removed the drain today. Keep the drain site covered until well healed. After showering please pat dry the steri strips.  Please see your appointment listed below.

## 2020-10-10 NOTE — Progress Notes (Signed)
Brandon Mullen is 3 weeks out after an abdominal wall reconstruction with mesh.  He is doing well.  Pain is improving.  Minimal serous fluid from the JP. Aching p.o. well.  Fevers no chills  PE NAD Abd: soft, vision healing well.  Rest of the staples were removed and a Steri-Strip placed.  JP was removed  A/P doing well considering his chronic pain condition and his major abdominal operation.  No evidence of complications.  Patient actually brought the last narcotic prescription because he did not need it.  Patient wishes to try Lyrica that has helped significantly.  I will do a 1 prescription of Lyrica. RTC in a few week

## 2020-10-14 ENCOUNTER — Telehealth: Payer: Self-pay | Admitting: *Deleted

## 2020-10-14 NOTE — Telephone Encounter (Signed)
Faxed FMLA to [facility] at [phone]

## 2020-10-22 ENCOUNTER — Other Ambulatory Visit: Payer: Self-pay

## 2020-10-22 ENCOUNTER — Ambulatory Visit (INDEPENDENT_AMBULATORY_CARE_PROVIDER_SITE_OTHER): Payer: BC Managed Care – PPO | Admitting: Surgery

## 2020-10-22 ENCOUNTER — Encounter: Payer: Self-pay | Admitting: Surgery

## 2020-10-22 VITALS — BP 139/91 | HR 101 | Temp 98.3°F | Ht 67.0 in | Wt 212.0 lb

## 2020-10-22 DIAGNOSIS — Z09 Encounter for follow-up examination after completed treatment for conditions other than malignant neoplasm: Secondary | ICD-10-CM

## 2020-10-22 NOTE — Progress Notes (Signed)
That is post abdominal wall reconstruction.  Doing well.  No fevers no chills.  He did have effect of Lyrica. Taking po and ambulating   PE NAD Abd: soft, incision healing well, no infection, no recurrence or peritonitis  A/p Doing well RTC 2 months No surgical issues at this time

## 2020-10-22 NOTE — Patient Instructions (Addendum)
We will extend your leave to return to work on September 8th. Call [place] to let them know the new date.   Follow up in about 2 months.

## 2020-10-30 ENCOUNTER — Telehealth: Payer: Self-pay | Admitting: *Deleted

## 2020-10-30 NOTE — Telephone Encounter (Signed)
Faxed extended time off to [facility] at [phone]

## 2020-12-17 ENCOUNTER — Encounter: Payer: Self-pay | Admitting: Surgery

## 2020-12-17 ENCOUNTER — Ambulatory Visit (INDEPENDENT_AMBULATORY_CARE_PROVIDER_SITE_OTHER): Payer: BC Managed Care – PPO | Admitting: Surgery

## 2020-12-17 ENCOUNTER — Other Ambulatory Visit: Payer: Self-pay

## 2020-12-17 VITALS — BP 134/89 | HR 103 | Temp 98.3°F | Ht 67.0 in | Wt 215.8 lb

## 2020-12-17 DIAGNOSIS — K434 Parastomal hernia with gangrene: Secondary | ICD-10-CM

## 2020-12-17 DIAGNOSIS — Z9889 Other specified postprocedural states: Secondary | ICD-10-CM

## 2020-12-17 DIAGNOSIS — K431 Incisional hernia with gangrene: Secondary | ICD-10-CM

## 2020-12-17 DIAGNOSIS — Z8719 Personal history of other diseases of the digestive system: Secondary | ICD-10-CM

## 2020-12-17 DIAGNOSIS — Z09 Encounter for follow-up examination after completed treatment for conditions other than malignant neoplasm: Secondary | ICD-10-CM

## 2020-12-17 NOTE — Patient Instructions (Addendum)
If you have any concerns or questions, please feel free to call our office. Follow up as needed.  Laparoscopic Ventral Hernia Repair, Care After  The following information offers guidance on how to care for yourself after your procedure. Your health care provider may also give you more specific instructions. If you have problems or questions, contact your health care provider. What can I expect after the procedure? After the procedure, it is common to have pain, discomfort, or soreness. Follow these instructions at home: Medicines Take over-the-counter and prescription medicines only as told by your health care provider. Ask your health care provider if the medicine prescribed to you: Requires you to avoid driving or using machinery. Can cause constipation. You may need to take these actions to prevent or treat constipation: Drink enough fluid to keep your urine pale yellow. Take over-the-counter or prescription medicines. Eat foods that are high in fiber, such as beans, whole grains, and fresh fruits and vegetables. Limit foods that are high in fat and processed sugars, such as fried or sweet foods. Incision care  Follow instructions from your health care provider about how to take care of your incisions. Make sure you: Wash your hands with soap and water for at least 20 seconds before and after you change your bandage (dressing) or before you touch your abdomen. If soap and water are not available, use hand sanitizer. Change your dressing as told by your health care provider. Leave stitches (sutures), skin glue, or adhesive strips in place. These skin closures may need to stay in place for 2 weeks or longer. If adhesive strip edges start to loosen and curl up, you may trim the loose edges. Do not remove adhesive strips completely unless your health care provider tells you to do that. Check your incision areas every day for signs of infection. Check for: More redness, swelling, or  pain. Fluid or blood. Warmth. Pus or a bad smell. Bathing  Do not take baths, swim, or use a hot tub until your health care provider approves. Ask your health care provider if you may take showers. You may only be allowed to take sponge baths. Keep your dressing dry until your health care provider says it can be removed. Activity  Rest as told by your health care provider. Avoid sitting for a long time without moving. Get up to take short walks every 1-2 hours. This is important to improve blood flow and breathing. Ask for help if you feel weak or unsteady. Do not lift anything that is heavier than 10 lb (4.5 kg), or the limit that you are told, until your health care provider says that it is safe. If you were given a sedative during the procedure, it can affect you for several hours. Do not drive or operate machinery until your health care provider says that it is safe. Return to your normal activities as told by your health care provider. Ask your health care provider what activities are safe for you. General instructions  Hold a pillow over your abdomen when you cough or sneeze. This helps with pain. Do not use any products that contain nicotine or tobacco. These products include cigarettes, chewing tobacco, and vaping devices, such as e-cigarettes. These can delay healing after surgery. If you need help quitting, ask your health care provider. You may be asked to continue to do deep breathing exercises at home. This will help to prevent a lung infection. Keep all follow-up visits. This is important. Contact a health care provider  if: You have any of these signs of infection: More redness, swelling, or pain around an incision. Fluid or blood coming from an incision. Warmth coming from an incision. Pus or a bad smell coming from an incision. A fever or chills. You have pain that gets worse or does not get better with medicine. You have nausea or vomiting. You have a cough. You have  shortness of breath. You have not had a bowel movement in 3 days. You are not able to urinate. Get help right away if you have: Severe pain in your abdomen. Persistent nausea and vomiting. Redness, warmth, or pain in your leg. Chest pain. Trouble breathing. These symptoms may represent a serious problem that is an emergency. Do not wait to see if the symptoms will go away. Get medical help right away. Call your local emergency services (911 in the [place] Do not drive yourself to the hospital. Summary After this procedure, it is common to have pain, discomfort, or soreness. Follow instructions from your health care provider about how to take care of your incision. Check your incision area every day for signs of infection. Report any signs of infection to your health care provider. Keep all follow-up visits. This is important. This information is not intended to replace advice given to you by your health care provider. Make sure you discuss any questions you have with your health care provider. Document Revised: 10/05/2019 Document Reviewed: 10/05/2019 Elsevier Patient Education  [place].
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
## 2021-06-04 ENCOUNTER — Emergency Department
Admission: EM | Admit: 2021-06-04 | Discharge: 2021-06-04 | Disposition: A | Discharge status: Home / Self Care | Payer: BC Managed Care – PPO | Attending: Emergency Medicine | Admitting: Emergency Medicine

## 2021-06-04 ENCOUNTER — Other Ambulatory Visit: Payer: Self-pay

## 2021-06-04 ENCOUNTER — Emergency Department: Payer: BC Managed Care – PPO

## 2021-06-04 ENCOUNTER — Encounter: Payer: Self-pay | Admitting: Emergency Medicine

## 2021-06-04 DIAGNOSIS — E039 Hypothyroidism, unspecified: Secondary | ICD-10-CM | POA: Diagnosis not present

## 2021-06-04 DIAGNOSIS — M25562 Pain in left knee: Secondary | ICD-10-CM | POA: Diagnosis present

## 2021-06-04 DIAGNOSIS — I10 Essential (primary) hypertension: Secondary | ICD-10-CM | POA: Insufficient documentation

## 2021-06-04 MED ORDER — MELOXICAM 15 MG PO TABS: 15.0000 mg | ORAL_TABLET | Freq: Every day | ORAL | 0 refills | Status: DC

## 2021-06-04 NOTE — Discharge Instructions (Signed)
Please follow up with orthopedics if pain does not improve over the week. ? ?Return to the ER for symptoms that change or worsen if unable to see orthopedics or primary care. ?

## 2021-06-04 NOTE — ED Triage Notes (Signed)
Pt here with left knee pain that started yesterday. Pt denies fall but says that he 'zigged when he should have zagged'. Pt not able to bear weight on affected leg. Pt c/o pain. Pt stable in triage. ?

## 2021-06-04 NOTE — ED Notes (Signed)
See triage note.  Presents with pain to left knee  states pain after twisting wrong  ambulates with slight limp.Marland Kitchen ?

## 2021-06-05 NOTE — ED Provider Notes (Signed)
? ?  [facility] ?Provider Note ? ? ? Event Date/Time  ? First MD Initiated Contact with Patient 06/04/21 0902   ?  (approximate) ? ? ?History  ? ?Knee Pain ? ? ?HPI ? ?Brandon Mullen is a 46 y.o. male with history of hypertension, GERD, hypothyroidism, diverticulitis and as listed in EMR presents to the emergency department for treatment and evaluation of left knee pain since yesterday.  He moved his knee in an awkward way and heard/felt something snap. No previous knee injury. Painful to attempt to bear weight. ? ?  ? ? ?Physical Exam  ? ?Triage Vital Signs: ?ED Triage Vitals [06/04/21 0854]  ?Enc Vitals Group  ?   BP 124/84  ?   Pulse Rate 81  ?   Resp 18  ?   Temp 98.5 ?F (36.9 ?C)  ?   Temp Source Oral  ?   SpO2 95 %  ?   Weight 230 lb (104.3 kg)  ?   Height '5\' 8"'$  (1.727 m)  ?   Head Circumference   ?   Peak Flow   ?   Pain Score 8  ?   Pain Loc   ?   Pain Edu?   ?   Excl. in [place]?   ? ? ?Most recent vital signs: ?Vitals:  ? 06/04/21 0854  ?BP: 124/84  ?Pulse: 81  ?Resp: 18  ?Temp: 98.5 ?F (36.9 ?C)  ?SpO2: 95%  ? ? ?General: Awake, no distress.  ?CV:  Good peripheral perfusion.  ?Resp:  Normal effort.  ?Abd:  No distention.  ?Other:  Diffuse tenderness over left knee. Able to flex and extend left knee. ? ? ?ED Results / Procedures / Treatments  ? ?Labs ?(all labs ordered are listed, but only abnormal results are displayed) ?Labs Reviewed - No data to display ? ? ?EKG ? ?Not indicated. ? ? ?RADIOLOGY ? ?Image and radiology report reviewed by me. ? ?Left knee x-ray without bony abnormality. ? ?PROCEDURES: ? ?Critical Care performed: No ? ?Procedures ? ? ?MEDICATIONS ORDERED IN ED: ?Medications - No data to display ? ? ?IMPRESSION / MDM / ASSESSMENT AND PLAN / ED COURSE  ? ?I have reviewed the triage note. ? ?Differential diagnosis includes, but is not limited to: Knee strain, Ligament injury, tendon injury, effusion. ? ?46 year old male presenting to the emergency department for treatment and  evaluation of left knee pain.  See HPI for further details. ? ?X-ray of the left knee is negative for acute bony abnormality.  He may have a small effusion.  He will be placed in a hinged knee brace and given crutches.  He was encouraged to rest, ice, and elevate often throughout the day.  If he is not improving over the week, he is to follow-up with orthopedics. ? ? ? ?  ? ? ?FINAL CLINICAL IMPRESSION(S) / ED DIAGNOSES  ? ?Final diagnoses:  ?Acute pain of left knee  ? ? ? ?Rx / DC Orders  ? ?ED Discharge Orders   ? ?      Ordered  ?  meloxicam (MOBIC) 15 MG tablet  Daily       ? 06/04/21 1018  ? ?  ?  ? ?  ? ? ? ?Note:  This document was prepared using Dragon voice recognition software and may include unintentional dictation errors. ?  ?Victorino Dike, FNP ?06/08/21 1523 ? ?  ?Harvest Dark, MD ?06/15/21 2232 ? ?

## 2021-07-08 ENCOUNTER — Other Ambulatory Visit: Payer: Self-pay | Admitting: Physician Assistant

## 2021-07-08 DIAGNOSIS — M2392 Unspecified internal derangement of left knee: Secondary | ICD-10-CM

## 2021-07-17 ENCOUNTER — Ambulatory Visit
Admission: RE | Admit: 2021-07-17 | Discharge: 2021-07-17 | Disposition: A | Discharge status: Home / Self Care | Payer: BC Managed Care – PPO | Source: Ambulatory Visit | Attending: Physician Assistant | Admitting: Physician Assistant

## 2021-07-17 DIAGNOSIS — M2392 Unspecified internal derangement of left knee: Secondary | ICD-10-CM | POA: Insufficient documentation

## 2021-07-30 ENCOUNTER — Other Ambulatory Visit: Payer: Self-pay | Admitting: Orthopedic Surgery

## 2021-08-04 ENCOUNTER — Encounter: Payer: Self-pay | Admitting: Orthopedic Surgery

## 2021-08-11 ENCOUNTER — Ambulatory Visit
Admission: RE | Admit: 2021-08-11 | Discharge: 2021-08-11 | Disposition: A | Discharge status: Home / Self Care | Payer: BC Managed Care – PPO | Attending: Orthopedic Surgery | Admitting: Orthopedic Surgery

## 2021-08-11 ENCOUNTER — Ambulatory Visit: Payer: BC Managed Care – PPO | Admitting: Anesthesiology

## 2021-08-11 ENCOUNTER — Encounter
Admission: RE | Disposition: A | Discharge status: Home / Self Care | Payer: Self-pay | Source: Home / Self Care | Attending: Orthopedic Surgery

## 2021-08-11 ENCOUNTER — Other Ambulatory Visit: Payer: Self-pay

## 2021-08-11 ENCOUNTER — Encounter: Payer: Self-pay | Admitting: Orthopedic Surgery

## 2021-08-11 DIAGNOSIS — F17210 Nicotine dependence, cigarettes, uncomplicated: Secondary | ICD-10-CM | POA: Diagnosis not present

## 2021-08-11 DIAGNOSIS — S83232A Complex tear of medial meniscus, current injury, left knee, initial encounter: Secondary | ICD-10-CM | POA: Insufficient documentation

## 2021-08-11 DIAGNOSIS — Y92096 Garden or yard of other non-institutional residence as the place of occurrence of the external cause: Secondary | ICD-10-CM | POA: Diagnosis not present

## 2021-08-11 DIAGNOSIS — X501XXA Overexertion from prolonged static or awkward postures, initial encounter: Secondary | ICD-10-CM | POA: Insufficient documentation

## 2021-08-11 HISTORY — PX: KNEE ARTHROSCOPY WITH MEDIAL MENISECTOMY: SHX5651

## 2021-08-11 SURGERY — ARTHROSCOPY, KNEE, WITH MEDIAL MENISCECTOMY
Anesthesia: General | Site: Knee | Laterality: Left

## 2021-08-11 MED ORDER — OXYCODONE HCL 5 MG PO TABS
5.0000 mg | ORAL_TABLET | Freq: Once | ORAL | Status: AC | PRN
  Administered 2021-08-11: 5 mg via ORAL

## 2021-08-11 MED ORDER — GLYCOPYRROLATE 0.2 MG/ML IJ SOLN
INTRAMUSCULAR | Status: DC | PRN
  Administered 2021-08-11: .1 mg via INTRAVENOUS

## 2021-08-11 MED ORDER — LACTATED RINGERS IR SOLN
Status: DC | PRN
  Administered 2021-08-11: 6000 mL

## 2021-08-11 MED ORDER — MIDAZOLAM HCL 5 MG/5ML IJ SOLN
INTRAMUSCULAR | Status: DC | PRN
  Administered 2021-08-11: 2 mg via INTRAVENOUS

## 2021-08-11 MED ORDER — LIDOCAINE HCL (CARDIAC) PF 100 MG/5ML IV SOSY
PREFILLED_SYRINGE | INTRAVENOUS | Status: DC | PRN
  Administered 2021-08-11: 40 mg via INTRATRACHEAL

## 2021-08-11 MED ORDER — FENTANYL CITRATE (PF) 100 MCG/2ML IJ SOLN
INTRAMUSCULAR | Status: DC | PRN
  Administered 2021-08-11: 100 ug via INTRAVENOUS

## 2021-08-11 MED ORDER — PROPOFOL 10 MG/ML IV BOLUS
INTRAVENOUS | Status: DC | PRN
  Administered 2021-08-11: 200 mg via INTRAVENOUS

## 2021-08-11 MED ORDER — DEXAMETHASONE SODIUM PHOSPHATE 4 MG/ML IJ SOLN
INTRAMUSCULAR | Status: DC | PRN
  Administered 2021-08-11: 4 mg via INTRAVENOUS

## 2021-08-11 MED ORDER — CEFAZOLIN SODIUM-DEXTROSE 2-4 GM/100ML-% IV SOLN
2.0000 g | INTRAVENOUS | Status: AC
  Administered 2021-08-11: 2 g via INTRAVENOUS

## 2021-08-11 MED ORDER — OXYCODONE HCL 5 MG/5ML PO SOLN: 5.0000 mg | Freq: Once | ORAL | Status: AC | PRN

## 2021-08-11 MED ORDER — ACETAMINOPHEN 325 MG PO TABS: 325.0000 mg | ORAL_TABLET | ORAL | Status: DC | PRN

## 2021-08-11 MED ORDER — SCOPOLAMINE 1 MG/3DAYS TD PT72: 1.0000 | MEDICATED_PATCH | Freq: Once | TRANSDERMAL | Status: DC

## 2021-08-11 MED ORDER — ACETAMINOPHEN 500 MG PO TABS: 1000.0000 mg | ORAL_TABLET | Freq: Three times a day (TID) | ORAL | 2 refills | Status: DC

## 2021-08-11 MED ORDER — FENTANYL CITRATE PF 50 MCG/ML IJ SOSY: 25.0000 ug | PREFILLED_SYRINGE | INTRAMUSCULAR | Status: DC | PRN

## 2021-08-11 MED ORDER — ONDANSETRON 4 MG PO TBDP: 4.0000 mg | ORAL_TABLET | Freq: Three times a day (TID) | ORAL | 0 refills | Status: DC | PRN

## 2021-08-11 MED ORDER — LACTATED RINGERS IV SOLN: INTRAVENOUS | Status: DC

## 2021-08-11 MED ORDER — ROPIVACAINE HCL 5 MG/ML IJ SOLN
INTRAMUSCULAR | Status: DC | PRN
  Administered 2021-08-11: 40 mL via PERINEURAL

## 2021-08-11 MED ORDER — OXYCODONE HCL 5 MG PO TABS: 5.0000 mg | ORAL_TABLET | ORAL | 0 refills | Status: DC | PRN

## 2021-08-11 MED ORDER — ASPIRIN 325 MG PO TBEC: 325.0000 mg | DELAYED_RELEASE_TABLET | Freq: Every day | ORAL | 0 refills | Status: AC

## 2021-08-11 MED ORDER — ONDANSETRON HCL 4 MG/2ML IJ SOLN
4.0000 mg | Freq: Once | INTRAMUSCULAR | Status: AC | PRN
  Administered 2021-08-11: 4 mg via INTRAVENOUS

## 2021-08-11 MED ORDER — IBUPROFEN 800 MG PO TABS: 800.0000 mg | ORAL_TABLET | Freq: Three times a day (TID) | ORAL | 1 refills | Status: AC

## 2021-08-11 MED ORDER — ACETAMINOPHEN 160 MG/5ML PO SOLN: 325.0000 mg | ORAL | Status: DC | PRN

## 2021-08-11 SURGICAL SUPPLY — 51 items
ADAPTER IRRIG TUBE 2 SPIKE SOL (ADAPTER) ×2 IMPLANT
ADH SKN CLS APL DERMABOND .7 (GAUZE/BANDAGES/DRESSINGS) ×1
ADPR TBG 2 SPK PMP STRL ASCP (ADAPTER) ×1
APL PRP STRL LF DISP 70% ISPRP (MISCELLANEOUS) ×1
BLADE FULL RADIUS 3.5 (BLADE) ×2 IMPLANT
BLADE SURG SZ11 CARB STEEL (BLADE) ×2 IMPLANT
BNDG COHESIVE 4X5 TAN ST LF (GAUZE/BANDAGES/DRESSINGS) ×2 IMPLANT
BNDG ESMARK 6X12 TAN STRL LF (GAUZE/BANDAGES/DRESSINGS) ×2 IMPLANT
CARTRIDGE SUT 2-0 NONSTITCH (Anchor) ×3 IMPLANT
CHLORAPREP W/TINT 26 (MISCELLANEOUS) ×2 IMPLANT
COOLER POLAR GLACIER W/PUMP (MISCELLANEOUS) ×2 IMPLANT
COVER LIGHT HANDLE UNIVERSAL (MISCELLANEOUS) ×4 IMPLANT
CUFF TOURN SGL QUICK 30 (TOURNIQUET CUFF)
CUFF TRNQT CYL 30X4X21-28X (TOURNIQUET CUFF) ×1 IMPLANT
DERMABOND ADVANCED (GAUZE/BANDAGES/DRESSINGS) ×2
DERMABOND ADVANCED .7 DNX12 (GAUZE/BANDAGES/DRESSINGS) IMPLANT
DRAPE EXTREMITY T 121X128X90 (DISPOSABLE) ×2 IMPLANT
DRAPE IMP U-DRAPE 54X76 (DRAPES) ×2 IMPLANT
GAUZE SPONGE 4X4 12PLY STRL (GAUZE/BANDAGES/DRESSINGS) ×2 IMPLANT
GLOVE SRG 8 PF TXTR STRL LF DI (GLOVE) ×1 IMPLANT
GLOVE SURG ENC MOIS LTX SZ7.5 (GLOVE) ×3 IMPLANT
GLOVE SURG UNDER POLY LF SZ8 (GLOVE) ×4
GOWN STRL REUS W/ TWL LRG LVL3 (GOWN DISPOSABLE) ×1 IMPLANT
GOWN STRL REUS W/TWL LRG LVL3 (GOWN DISPOSABLE) ×2
IMP SYS 2ND FIX PEEK 4.75X19.1 (Miscellaneous) ×2 IMPLANT
IMPL SYS 2ND FX PEEK 4.75X19.1 (Miscellaneous) IMPLANT
IMPL SYS MENISCAL ROOT REPAIR (Orthopedic Implant) ×1 IMPLANT
IV LACTATED RINGER IRRG 3000ML (IV SOLUTION) ×4
IV LR IRRIG 3000ML ARTHROMATIC (IV SOLUTION) ×2 IMPLANT
KIT TURNOVER KIT A (KITS) ×2 IMPLANT
MANAGER SUT NOVOCUT (CUTTER) ×1 IMPLANT
MANIFOLD NEPTUNE II (INSTRUMENTS) ×2 IMPLANT
MAT ABSORB  FLUID 56X50 GRAY (MISCELLANEOUS) ×2
MAT ABSORB FLUID 56X50 GRAY (MISCELLANEOUS) ×1 IMPLANT
NOVOSTICH PRO MENISCAL 2-0 (Miscellaneous) ×2 IMPLANT
PACK ARTHROSCOPY KNEE (MISCELLANEOUS) ×2 IMPLANT
PAD ABD DERMACEA PRESS 5X9 (GAUZE/BANDAGES/DRESSINGS) ×2 IMPLANT
PAD WRAPON POLAR KNEE (MISCELLANEOUS) ×1 IMPLANT
SPONGE T-LAP 18X18 ~~LOC~~+RFID (SPONGE) ×1 IMPLANT
SUT ETHILON 3-0 FS-10 30 BLK (SUTURE) ×2
SUT MNCRL 4-0 (SUTURE) ×2
SUT MNCRL 4-0 27XMFL (SUTURE) ×1
SUT VIC AB 2-0 CT2 27 (SUTURE) ×1 IMPLANT
SUTURE EHLN 3-0 FS-10 30 BLK (SUTURE) ×1 IMPLANT
SUTURE MNCRL 4-0 27XMF (SUTURE) IMPLANT
SYSTEM NVSTCH PRO MENISCAL 2-0 (Miscellaneous) IMPLANT
TOWEL OR 17X26 4PK STRL BLUE (TOWEL DISPOSABLE) ×4 IMPLANT
TUBING INFLOW SET DBFLO PUMP (TUBING) ×2 IMPLANT
TUBING OUTFLOW SET DBLFO PUMP (TUBING) ×2 IMPLANT
WAND WEREWOLF FLOW 90D (MISCELLANEOUS) ×2 IMPLANT
WRAPON POLAR PAD KNEE (MISCELLANEOUS) ×2

## 2021-08-11 NOTE — Anesthesia Preprocedure Evaluation (Signed)
Anesthesia Evaluation  Patient identified by MRN, date of birth, ID band Patient awake    Reviewed: Allergy & Precautions, H&P , NPO status , Patient's Chart, lab work & pertinent test results  Airway Mallampati: II  TM Distance: >3 FB Neck ROM: full    Dental no notable dental hx.    Pulmonary former smoker,    Pulmonary exam normal breath sounds clear to auscultation       Cardiovascular hypertension, Normal cardiovascular exam Rhythm:regular Rate:Normal     Neuro/Psych    GI/Hepatic GERD  ,  Endo/Other  Hypothyroidism   Renal/GU      Musculoskeletal   Abdominal   Peds  Hematology   Anesthesia Other Findings   Reproductive/Obstetrics                             Anesthesia Physical Anesthesia Plan  ASA: 2  Anesthesia Plan: General LMA   Post-op Pain Management: Regional block, Fentanyl IV and Oxycodone PO   Induction:   PONV Risk Score and Plan: 1 and Treatment may vary due to age or medical condition, Ondansetron and Dexamethasone  Airway Management Planned:   Additional Equipment:   Intra-op Plan:   Post-operative Plan:   Informed Consent: I have reviewed the patients History and Physical, chart, labs and discussed the procedure including the risks, benefits and alternatives for the proposed anesthesia with the patient or authorized representative who has indicated his/her understanding and acceptance.     Dental Advisory Given  Plan Discussed with: CRNA  Anesthesia Plan Comments:         Anesthesia Quick Evaluation

## 2021-08-11 NOTE — Op Note (Addendum)
DATE: 08/11/2021   PRE-OP DIAGNOSIS:  1. Left medial meniscus root tear   POST-OP DIAGNOSIS:  1. Left medial meniscus complex tear (full width root and adjacent horizontal tear) 2.  Left knee medial plica band  PROCEDURES:  1. Left knee medial meniscus repair (root repair and horizontal tear repair) 2.  Left knee partial synovectomy with medial plica excision   SURGEON:  Langston Reusing, MD   ANESTHESIA: Femoral/popliteal regional nerve block plus general   TOTAL IV FLUIDS: See anesthesia record   ESTIMATED BLOOD LOSS: 10cc   TOURNIQUET TIME:  54 min   DRAINS:  None.   SPECIMENS: None   IMPLANTS:  - Arthrex Biocomposite SwiveLock (x1)     COMPLICATIONS: None   INDICATIONS: Brandon Mullen is a 46 y.o. male with knee pain that began after an acute, traumatic twisting injury approximately 2 months ago.  He has failed non-operative management. Clinical exam and radiographic studies were notable for left knee pain and swelling with instances of giving way with associated popping and catching on the medial aspect of her knee. Additionally, MRI showed a medial meniscus root tear with mild degenerative changes to medial compartment with associated medial tibial plateau stress reaction. Given the poor long-term prognosis of a meniscus root tear and high likelihood of significant progression of osteoarthritis, we elected to proceed with the above procedure after a discussion of the risks, benefits, and alternatives to surgery.    OPERATIVE FINDINGS:    Examination under anesthesia: A careful examination under anesthesia was performed.  Passive range of motion was: Hyperextension: 2.  Extension: 0.  Flexion: 130.  Lachman: normal. Pivot Shift: normal.  Posterior drawer: normal.  Varus stability in full extension: normal.  Varus stability in 30 degrees of flexion: normal.  Valgus stability in full extension: normal.  Valgus stability in 30 degrees of flexion: normal.   Intra-operative  findings: A thorough arthroscopic examination of the knee was performed.  The findings are: 1. Suprapatellar pouch: Normal 2. Undersurface of median ridge: Normal 3. Medial patellar facet: Grade 1 degenerative changes 4. Lateral patellar facet: Grade 1 degenerative changes 5. Trochlea: Grade 1 degenerative changes 6. Lateral gutter/popliteus tendon: Normal 7. Hoffa's fat pad: Extensive synovitis 8. Medial gutter/plica: Very prominent medial plica band 9. ACL: Normal 10. PCL: Normal 11. Medial meniscus: Complex tear of the medial meniscus involving a full width radial tear at the posterior root with adjacent horizontal/oblique tear of the posterior horn 12. Medial compartment cartilage: Diffuse grade 3 degenerative changes on MFC, more focal grade 3 changes on periphery of the medial tibial plateau 13. Lateral meniscus: Normal 14. Lateral compartment cartilage: Normal  DESCRIPTION OF PROCEDURE: I identified Brandon Mullen in the pre-operative holding area.  I marked the operative knee with my initials. I reviewed the risks and benefits of the proposed surgical intervention and the patient (and/or patient's guardian) wished to proceed. The patient was transferred to the operative suite and placed in the supine position with all bony prominences padded.  Anesthesia was administered. Appropriate IV antibiotics were administered prior to incision. The extremity was then prepped and draped in standard fashion. A time out was performed confirming the correct extremity, correct patient, and correct procedure.   Arthroscopy portals were marked. Local anesthetic was injected to the planned portal sites. The anterolateral portal was established with an 11 blade. The arthroscope was placed in the anterolateral portal and then into the suprapatellar pouch.  A diagnostic knee scope was completed with the above  findings. Next, the medial portal was established under needle localization.  An oscillating shaver  was used to perform a partial synovectomy and debride the extensive medial plica band.  The MCL was pie-crusted to improve visualization of the posterior horn. The meniscus root tear was identified and probed to confirm our findings. Next, an Arthrex meniscus root aiming guide was used to mark out the tibial incision. An approximately 3cm vertical incision was made medial to the tibial tubercle. This was carried down to the sartorius fascia with bovie electrocautery, and the fascia was incised. An elevator was used to clear periosteum from the tibia in the area of the anticipated bone tunnel. Hemostasis was achieved. The guide was reinserted into the tibia and was placed over the anatomic footprint of the medial meniscus root. We then used a 6.60m FlipCutter to drill into the tibia and create a 636msocket for the meniscus root. A FiberStick was passed through our tibial tunnel and into the joint. This was retrieved and passed through the anterolateral portal.   Next, using a Ceterix Novostitch, two sutures were passed just medial to the meniscus root in a luggage tag configuration. This allowed for excellent purchase of the meniscus root.  We ensured there was no soft tissue bridge between the sutures and pulled the passing stitch from the FiPalmyrahrough the anteromedial portal. We then used the passing stitch to bring the sutures in the meniscus out through the tibial tunnel. These were then passed through an [employer] Incnchor. Anchor hole was drilled ~2cm distal to the previously drilled tibial tunnel. Anchor was inserted with appropriate tension while visualizing the repair with the arthroscope, and this achieved excellent interference fit. The meniscus root was probed and found to be stable.  At this point the horizontal/oblique tear adjacent to the meniscus root was noted.  The intrasubstance of this tear was rasped and an oscillating shaver was used to debride any further tissue.  Another Ceterix  Novostitch was utilized, this time in a hay bale fashion, to reduce this component of the tear.  This construct achieved an anatomic repair of the medial meniscus.  Any loose bony debris was removed from the knee joint with a shaver and excess fluid was evacuated from the joint. Closure of the portals with 3-0 Nylon was performed. The subdermal layer of the tibial incision was closed with 2-0 vicryl and the skin was closed with 4-0 Monocryl in a running fashion and Dermabond.  Xeroform gauze and dry sterile dressings were applied. A PolarCare and hinged knee brace were also applied.   Instrument, sponge, and needle counts were correct prior to wound closure and at the conclusion of the case.   Additionally, this case had increased complexity compared to standard meniscus repair given that the complex nature of this meniscus tear.. Repair of this meniscus root component of the tear involved making a separate open incision, drilling a tunnel through the tibia, and using an implant in the tibia for fixation, all of which would otherwise not occur for standard meniscal repairs.  Furthermore, the horizontal component of the tear also required additional fixation as outlined above.  These steps increased surgical time by approximately 30 minutes.  DISPOSITION: PACU - hemodynamically stable.    POSTOPERATIVE PLAN: The patient will be discharged home today.     Non-weight bearing x 4 weeks. 50% WB from weeks 4-6. ASA for DVT ppx x 4 weeks. The patient will be attending physical therapy within 1 week of surgery. Physical  therapy per Meniscus Root Repair Rehab Guidelines.   Patient to return to clinic in approximately 2 weeks for suture removal.

## 2021-08-11 NOTE — Discharge Instructions (Signed)
Arthroscopic Knee Surgery - Meniscus Repair   Post-Op Instructions   1. Bracing or crutches: Crutches will be provided at the time of discharge from the surgery center. Keep brace locked in extension at all times except as directed by physical therapy.    2. Ice: You may be provided with a device [facility]) that allows you to ice the affected area effectively. Otherwise you can ice manually.    3. Driving:  Plan on not driving for at least four weeks. Please note that you are advised NOT to drive while taking narcotic pain medications as you may be impaired and unsafe to drive.   4. Activity: Ankle pumps several times an hour while awake to prevent blood clots. Weight bearing: NO WEIGHT BEARING FOR 4 WEEKS. Use crutches for at least 4 weeks, if not 6 based on your surgery. Bending and straightening the knee is unlimited, but do not flex your knee past 90 degrees until cleared by your therapist. Elevate knee above heart level as much as possible for one week. Avoid standing more than 5 minutes (consecutively) for the first week. No exercise involving the knee until cleared by the surgeon or physical therapist.  Avoid long distance travel for 4 weeks.   5. Medications:  - You have been provided a prescription for narcotic pain medicine. After surgery, take 1-2 narcotic tablets every 4 hours if needed for severe pain. If it has tylenol (acetaminophen), please do not take a total of more than 3016m/day of tylenol.  - A prescription for anti-nausea medication will be provided in case the narcotic medicine causes nausea - take 1 tablet every 6 hours only if nauseated.  - Take ibuprofen 800 mg every 8 hours with food to reduce post-operative knee swelling. DO NOT STOP IBUPROFEN POST-OP UNTIL INSTRUCTED TO DO SO at first post-op office visit (10-14 days after surgery).  - Take enteric coated aspirin 325 mg once daily for 4 weeks to prevent blood clots.  -Take tylenol 1000 every 8 hours for pain.  May  stop tylenol 3 days after surgery or when you are having minimal pain. If your narcotic has tylenol (acetaminophen), please do not take a total of more than 30016mday of tylenol.    If you are taking prescription medication for anxiety, depression, insomnia, muscle spasm, chronic pain, or for attention deficit disorder you are advised that you are at a higher risk of adverse effects with use of narcotics post-op, including narcotic addiction/dependence, depressed breathing, death. If you use non-prescribed substances: alcohol, marijuana, cocaine, heroin, methamphetamines, etc., you are at a higher risk of adverse effects with use of narcotics post-op, including narcotic addiction/dependence, depressed breathing, death. You are advised that taking > 50 morphine milligram equivalents (MME) of narcotic pain medication per day results in twice the risk of overdose or death. For your prescription provided: oxycodone 5 mg - taking more than 6 tablets per day. Be advised that we will prescribe narcotics short-term, for acute post-operative pain only - 1 week for minor operations such as knee arthroscopy for meniscus tear resection, and 3 weeks for major operations such as knee repair/reconstruction surgeries.   6. Bandages: The physical therapist should change the bandages at the first post-op appointment. If needed, the dressing supplies have been provided to you. You may shower after this with waterproof bandaids covering the incisions.    7. Physical Therapy: 2 times per week for the first 4 weeks, then 1-2 times per week from weeks 4-8 post-op. Therapy typically  starts on post operative Day 3 or 4. You have been provided an order for physical therapy. The therapist will provide home exercises.   8. Work: May return to full work when off of crutches. May do light duty/desk job in approximately 1-2 weeks when off of narcotics, pain is well-controlled, and swelling has decreased.   9. Post-Op  Appointments: Your first post-op appointment will be with Dr. Mayce Noyes in approximately 2 weeks time.    If you find that they have not been scheduled please call the Orthopaedic Appointment front desk at [phone].   

## 2021-08-11 NOTE — Anesthesia Procedure Notes (Signed)
Procedure Name: LMA Insertion Date/Time: 08/11/2021 2:06 PM  Performed by: Mayme Genta, CRNAPre-anesthesia Checklist: Patient identified, Emergency Drugs available, Suction available, Timeout performed and Patient being monitored Patient Re-evaluated:Patient Re-evaluated prior to induction Oxygen Delivery Method: Circle system utilized Preoxygenation: Pre-oxygenation with 100% oxygen Induction Type: IV induction LMA: LMA inserted LMA Size: 4.0 Number of attempts: 1 Placement Confirmation: positive ETCO2 and breath sounds checked- equal and bilateral Tube secured with: Tape

## 2021-08-11 NOTE — Anesthesia Postprocedure Evaluation (Signed)
Anesthesia Post Note  Patient: Brandon Mullen  Procedure(s) Performed: Left knee arthroscopy and medial meniscus root repair and partial synovectomy (Left: Knee)     Patient location during evaluation: PACU Anesthesia Type: General Level of consciousness: awake and alert and oriented Pain management: satisfactory to patient Vital Signs Assessment: post-procedure vital signs reviewed and stable Respiratory status: spontaneous breathing, nonlabored ventilation and respiratory function stable Cardiovascular status: blood pressure returned to baseline and stable Postop Assessment: Adequate PO intake and No signs of nausea or vomiting Anesthetic complications: no   No notable events documented.  Raliegh Ip

## 2021-08-11 NOTE — Anesthesia Procedure Notes (Signed)
Anesthesia Regional Block: Femoral nerve block   Pre-Anesthetic Checklist: , timeout performed,  Correct Patient, Correct Site, Correct Laterality,  Correct Procedure, Correct Position, site marked,  Risks and benefits discussed,  Surgical consent,  Pre-op evaluation,  At surgeon's request and post-op pain management  Laterality: Left  Prep: chloraprep       Needles:  Injection technique: Single-shot  Needle Type: Echogenic Needle     Needle Length: 9cm  Needle Gauge: 21     Additional Needles:   Procedures:,,,, ultrasound used (permanent image in chart),,    Narrative:  Start time: 08/11/2021 12:03 PM End time: 08/11/2021 12:15 PM Injection made incrementally with aspirations every 5 mL.  Performed by: Personally  Anesthesiologist: Ronelle Nigh, MD  Additional Notes: Functioning IV was confirmed and monitors applied. Ultrasound guidance: relevant anatomy identified, needle position confirmed, local anesthetic spread visualized around nerve(s)., vascular puncture avoided.  Image printed for medical record.  Negative aspiration and no paresthesias; incremental administration of local anesthetic. The patient tolerated the procedure well. Vitals signes recorded in RN notes. 20 ml 0.5% Ropi

## 2021-08-11 NOTE — H&P (Signed)
Paper H&P to be scanned into permanent record. H&P reviewed. No significant changes noted.  

## 2021-08-11 NOTE — Anesthesia Procedure Notes (Signed)
Anesthesia Regional Block: Popliteal block   Pre-Anesthetic Checklist: , timeout performed,  Correct Patient, Correct Site, Correct Laterality,  Correct Procedure, Correct Position, site marked,  Risks and benefits discussed,  Surgical consent,  Pre-op evaluation,  At surgeon's request and post-op pain management  Laterality: Left  Prep: chloraprep       Needles:  Injection technique: Single-shot  Needle Type: Echogenic Needle     Needle Length: 9cm  Needle Gauge: 21     Additional Needles:   Procedures:,,,, ultrasound used (permanent image in chart),,    Narrative:  Start time: 08/11/2021 12:03 PM End time: 08/11/2021 12:15 PM Injection made incrementally with aspirations every 5 mL.  Performed by: Personally  Anesthesiologist: Ronelle Nigh, MD  Additional Notes: Functioning IV was confirmed and monitors applied. Ultrasound guidance: relevant anatomy identified, needle position confirmed, local anesthetic spread visualized around nerve(s)., vascular puncture avoided.  Image printed for medical record.  Negative aspiration and no paresthesias; incremental administration of local anesthetic. The patient tolerated the procedure well. Vitals signes recorded in RN notes.  20 ml 0.5% Ropi

## 2021-08-11 NOTE — Transfer of Care (Signed)
Immediate Anesthesia Transfer of Care Note  Patient: Brandon Mullen  Procedure(s) Performed: Left knee arthroscopy and medial meniscus root repair and partial synovectomy (Left: Knee)  Patient Location: PACU  Anesthesia Type: General LMA  Level of Consciousness: awake, alert  and patient cooperative  Airway and Oxygen Therapy: Patient Spontanous Breathing and Patient connected to supplemental oxygen  Post-op Assessment: Post-op Vital signs reviewed, Patient's Cardiovascular Status Stable, Respiratory Function Stable, Patent Airway and No signs of Nausea or vomiting  Post-op Vital Signs: Reviewed and stable  Complications: No notable events documented.

## 2021-08-12 ENCOUNTER — Encounter: Payer: Self-pay | Admitting: Orthopedic Surgery

## 2022-02-04 ENCOUNTER — Other Ambulatory Visit: Payer: Self-pay | Admitting: Orthopedic Surgery

## 2022-02-04 DIAGNOSIS — M25562 Pain in left knee: Secondary | ICD-10-CM

## 2022-02-07 ENCOUNTER — Ambulatory Visit
Admission: RE | Admit: 2022-02-07 | Discharge: 2022-02-07 | Disposition: A | Discharge status: Home / Self Care | Payer: BC Managed Care – PPO | Source: Ambulatory Visit | Attending: Orthopedic Surgery | Admitting: Orthopedic Surgery

## 2022-02-07 DIAGNOSIS — I890 Lymphedema, not elsewhere classified: Secondary | ICD-10-CM | POA: Insufficient documentation

## 2022-02-07 DIAGNOSIS — M25562 Pain in left knee: Secondary | ICD-10-CM | POA: Diagnosis present
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
## 2022-02-08 ENCOUNTER — Ambulatory Visit (INDEPENDENT_AMBULATORY_CARE_PROVIDER_SITE_OTHER): Payer: BC Managed Care – PPO | Admitting: Vascular Surgery

## 2022-02-08 ENCOUNTER — Encounter (INDEPENDENT_AMBULATORY_CARE_PROVIDER_SITE_OTHER): Payer: Self-pay | Admitting: Vascular Surgery

## 2022-02-08 VITALS — BP 136/89 | HR 89 | Resp 16 | Wt 246.8 lb

## 2022-02-08 DIAGNOSIS — I10 Essential (primary) hypertension: Secondary | ICD-10-CM

## 2022-02-08 DIAGNOSIS — E039 Hypothyroidism, unspecified: Secondary | ICD-10-CM | POA: Diagnosis not present

## 2022-02-08 DIAGNOSIS — G629 Polyneuropathy, unspecified: Secondary | ICD-10-CM

## 2022-02-08 DIAGNOSIS — I872 Venous insufficiency (chronic) (peripheral): Secondary | ICD-10-CM | POA: Diagnosis not present

## 2022-02-08 DIAGNOSIS — I890 Lymphedema, not elsewhere classified: Secondary | ICD-10-CM | POA: Diagnosis not present

## 2022-02-14 ENCOUNTER — Encounter (INDEPENDENT_AMBULATORY_CARE_PROVIDER_SITE_OTHER): Payer: Self-pay | Admitting: Vascular Surgery

## 2022-02-14 DIAGNOSIS — G629 Polyneuropathy, unspecified: Secondary | ICD-10-CM | POA: Insufficient documentation

## 2022-02-14 DIAGNOSIS — I872 Venous insufficiency (chronic) (peripheral): Secondary | ICD-10-CM | POA: Insufficient documentation

## 2022-02-14 DIAGNOSIS — I10 Essential (primary) hypertension: Secondary | ICD-10-CM | POA: Insufficient documentation
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
## 2022-05-10 ENCOUNTER — Ambulatory Visit (INDEPENDENT_AMBULATORY_CARE_PROVIDER_SITE_OTHER): Payer: BC Managed Care – PPO | Admitting: Vascular Surgery

## 2022-05-10 ENCOUNTER — Encounter (INDEPENDENT_AMBULATORY_CARE_PROVIDER_SITE_OTHER): Payer: BC Managed Care – PPO

## 2022-05-10 DIAGNOSIS — I890 Lymphedema, not elsewhere classified: Secondary | ICD-10-CM

## 2022-05-10 DIAGNOSIS — I10 Essential (primary) hypertension: Secondary | ICD-10-CM

## 2022-05-10 DIAGNOSIS — I872 Venous insufficiency (chronic) (peripheral): Secondary | ICD-10-CM

## 2022-07-08 ENCOUNTER — Encounter: Payer: Self-pay | Admitting: Nurse Practitioner

## 2022-07-08 ENCOUNTER — Ambulatory Visit: Payer: BC Managed Care – PPO | Admitting: Nurse Practitioner

## 2022-07-08 VITALS — BP 140/90 | HR 97 | Temp 98.4°F | Ht 68.0 in | Wt 245.8 lb

## 2022-07-08 DIAGNOSIS — E785 Hyperlipidemia, unspecified: Secondary | ICD-10-CM | POA: Diagnosis not present

## 2022-07-08 DIAGNOSIS — E039 Hypothyroidism, unspecified: Secondary | ICD-10-CM

## 2022-07-08 DIAGNOSIS — I10 Essential (primary) hypertension: Secondary | ICD-10-CM | POA: Diagnosis not present

## 2022-07-08 DIAGNOSIS — F331 Major depressive disorder, recurrent, moderate: Secondary | ICD-10-CM | POA: Diagnosis not present

## 2022-07-08 NOTE — Assessment & Plan Note (Addendum)
Chronic. Currently taking Levothyroxine 200 mcg daily. Lab work as outlined. Patient is symptomatic. Will contact with results, discussed possible referral to Endocrinology if unable to get level stable. Will continue to monitor.

## 2022-07-08 NOTE — Progress Notes (Signed)
Brandon Dicker, NP-C Phone: [phone]  Brandon Mullen is a 47 y.o. male who presents today to establish care. He is concerned about his thyroid and would like lab work to check it. He saw his previous PCP approximately 2-3 weeks ago and completed some lab work, will try to get those results. He was started on Lipitor 40 mg at that time.   HYPERTENSION Disease Monitoring Home BP Monitoring- 150s/90s Chest pain- No    Dyspnea- No Medications Compliance-  Lisinopril. Lightheadedness-  No  Edema- No BMET    Component Value Date/Time   NA 134 (L) 09/22/2020 0826   K 4.1 09/22/2020 0826   CL 100 09/22/2020 0826   CO2 29 09/22/2020 0826   GLUCOSE 95 09/22/2020 0826   BUN 11 09/22/2020 0826   CREATININE 0.74 09/22/2020 0826   CALCIUM 8.3 (L) 09/22/2020 0826   GFRNONAA >60 09/22/2020 0826   GFRAA >60 09/21/2019 0830   HYPERLIPIDEMIA Symptoms Chest pain on exertion:  No   Leg claudication:   No Medications: Compliance- Lipitor Right upper quadrant pain- No  Muscle aches- No Lipid Panel     Component Value Date/Time   CHOL 228 (H) 11/04/2010 0620   TRIG 202 (H) 01/08/2019 0459   HDL 33 (L) 11/04/2010 0620   CHOLHDL 6.9 11/04/2010 0620   VLDL 51 (H) 11/04/2010 0620   LDLCALC 144 (H) 11/04/2010 0620   HYPOTHYROIDISM Disease Monitoring Weight changes: Yes, gaining  Skin Changes: No Palpitations: No Heat/Cold intolerance: Hot Medication Monitoring Compliance:  Levothyroxine   Last TSH:   Lab Results  Component Value Date   TSH 0.10 (L) 07/08/2022   Anxiety/Depression: PHQ- 14 and GAD- 7. Currently taking Lexapro 10 mg daily. He feels like his symptoms are well managed at this time. He does not want to increase his dose of medication. He denies SI/HI. He is seeing a therapist once a month.    Active Ambulatory Problems    Diagnosis Date Noted   MDD (major depressive disorder), recurrent episode (HCC) 06/02/2013   Anxiety state, unspecified 06/02/2013   Hypothyroidism  06/04/2013   Alcohol abuse 06/23/2013   Incisional hernia of anterior abdominal wall without obstruction or gangrene 10/19/2016   Diverticulitis of intestine with abscess without bleeding 12/25/2018   Parastomal hernia without obstruction or gangrene    Partial small bowel obstruction (HCC) 09/17/2020   Incisional hernia with gangrene and obstruction    Lymphedema 02/07/2022   Essential hypertension 02/14/2022   Neuropathy 02/14/2022   Chronic venous insufficiency 02/14/2022   Hyperlipidemia 07/09/2022   Resolved Ambulatory Problems    Diagnosis Date Noted   Moderate alcohol use disorder (HCC) 06/02/2013   Colostomy in place [facility]) 05/03/2019   Past Medical History:  Diagnosis Date   Arthritis    Depression    Diverticulitis 2020   GERD (gastroesophageal reflux disease)    Hernia, umbilical    Hypertension     Family History  Problem Relation Age of Onset   Hyperlipidemia Mother    Hyperlipidemia Father    Lupus Father    Fibromyalgia Father    Alcohol abuse Maternal Grandfather    Alcohol abuse Maternal Grandmother    Alcohol abuse Paternal Grandfather    Alcohol abuse Paternal Grandmother     Social History   Socioeconomic History   Marital status: Single    Spouse name: Not on file   Number of children: 0   Years of education: Not on file   Highest education level: High school  graduate  Occupational History   Not on file  Tobacco Use   Smoking status: Former    Packs/day: 1.00    Years: 20.00    Additional pack years: 0.00    Total pack years: 20.00    Types: Cigarettes    Quit date: 2019    Years since quitting: 5.3   Smokeless tobacco: Current    Types: Chew  Vaping Use   Vaping Use: Never used  Substance and Sexual Activity   Alcohol use: Yes    Alcohol/week: 34.0 - 44.0 standard drinks of alcohol    Types: 24 Cans of beer, 10 - 20 Shots of liquor per week    Comment: none since 2019   Drug use: Yes    Types: Marijuana, "Crack" cocaine,  Cocaine    Comment: last used 12-25-17   Sexual activity: Yes  Other Topics Concern   Not on file  Social History Narrative   Not on file   Social Determinants of Health   Financial Resource Strain: Low Risk  (12/30/2017)   Overall Financial Resource Strain (CARDIA)    Difficulty of Paying Living Expenses: Not hard at all  Food Insecurity: No Food Insecurity (12/30/2017)   Hunger Vital Sign    Worried About Running Out of Food in the Last Year: Never true    Ran Out of Food in the Last Year: Never true  Transportation Needs: Unmet Transportation Needs (12/30/2017)   PRAPARE - Transportation    Lack of Transportation (Medical): Yes    Lack of Transportation (Non-Medical): Yes  Physical Activity: Sufficiently Active (12/30/2017)   Exercise Vital Sign    Days of Exercise per Week: 4 days    Minutes of Exercise per Session: 60 min  Stress: Stress Concern Present (12/30/2017)   [employer] of Occupational Health - Occupational Stress Questionnaire    Feeling of Stress : To some extent  Social Connections: Unknown (12/30/2017)   Social Connection and Isolation Panel [NHANES]    Frequency of Communication with Friends and Family: Not on file    Frequency of Social Gatherings with Friends and Family: Not on file    Attends Religious Services: Never    Active Member of Clubs or Organizations: No    Attends Banker Meetings: Never    Marital Status: Never married  Intimate Partner Violence: Not At Risk (12/30/2017)   Humiliation, Afraid, Rape, and Kick questionnaire    Fear of Current or Ex-Partner: No    Emotionally Abused: No    Physically Abused: No    Sexually Abused: No    ROS  General:  Negative for unexplained weight loss, fever Skin: Negative for new or changing mole, sore that won't heal HEENT: Negative for trouble hearing, trouble seeing, ringing in ears, mouth sores, hoarseness, change in voice, dysphagia. CV:  Negative for chest pain, dyspnea, edema,  palpitations Resp: Negative for cough, dyspnea, hemoptysis GI: Negative for nausea, vomiting, diarrhea, constipation, abdominal pain, melena, hematochezia. GU: Negative for dysuria, incontinence, urinary hesitance, hematuria, vaginal or penile discharge, polyuria, sexual difficulty, lumps in testicle or breasts MSK: Negative for muscle cramps or aches, joint pain or swelling Neuro: Negative for headaches, weakness, numbness, dizziness, passing out/fainting Psych: Negative for depression, anxiety, memory problems  Objective  Physical Exam Vitals:   07/08/22 1520 07/08/22 1540  BP: (!) 140/98 (!) 140/90  Pulse: 97   Temp: 98.4 F (36.9 C)   SpO2: 96%     BP Readings from Last 3 Encounters:  07/08/22 (!) 140/90  02/08/22 136/89  08/11/21 131/89   Wt Readings from Last 3 Encounters:  07/08/22 245 lb 12.8 oz (111.5 kg)  02/08/22 246 lb 12.8 oz (111.9 kg)  08/11/21 237 lb (107.5 kg)    Physical Exam Constitutional:      General: He is not in acute distress.    Appearance: Normal appearance.  HENT:     Head: Normocephalic.  Cardiovascular:     Rate and Rhythm: Normal rate and regular rhythm.     Heart sounds: Normal heart sounds.  Pulmonary:     Effort: Pulmonary effort is normal.     Breath sounds: Normal breath sounds.  Skin:    General: Skin is warm and dry.  Neurological:     General: No focal deficit present.     Mental Status: He is alert.  Psychiatric:        Mood and Affect: Mood normal.        Behavior: Behavior normal.    Assessment/Plan:   Essential hypertension Assessment & Plan: Chronic. Elevated reading x 2 in office today. He is currently taking Lisinopril 20 mg daily. He does check his blood pressure at home, readings were reviewed and are consistently higher than he was today in office. He will continue his Lisinopril daily and return in 1-2 weeks for a blood pressure check and bring his machine in at that time to compare to ours here. If continuing  to remain elevated, will increase Lisinopril. Advised to decrease salt intake and counseled on healthy diet. Will continue to monitor closely.    Hyperlipidemia, unspecified hyperlipidemia type Assessment & Plan: Newly diagnosed and started on Lipitor 40 mg daily. Continue. Encouraged healthy diet and exercise. Will check fasting lipids in the future.    Hypothyroidism, unspecified type Assessment & Plan: Chronic. Currently taking Levothyroxine 200 mcg daily. Lab work as outlined. Patient is symptomatic. Will contact with results, discussed possible referral to Endocrinology if unable to get level stable. Will continue to monitor.   Orders: -     T4, free -     T3, free -     TSH  Moderate episode of recurrent major depressive disorder (HCC) Assessment & Plan: Chronic. Stable on Lexapro 10 mg daily. Continue. Symptoms well managed at this time. Encouraged to continue seeing therapist monthly. Advised to contact if worsening symptoms, unusual behavior changes or suicidal thoughts occur. Will continue to monitor.      Return in about 3 months (around 10/08/2022) for Follow up.   Brandon Dicker, NP-C [facility] - [employer]

## 2022-07-08 NOTE — Assessment & Plan Note (Signed)
Chronic. Elevated reading x 2 in office today. He is currently taking Lisinopril 20 mg daily. He does check his blood pressure at home, readings were reviewed and are consistently higher than he was today in office. He will continue his Lisinopril daily and return in 1-2 weeks for a blood pressure check and bring his machine in at that time to compare to ours here. If continuing to remain elevated, will increase Lisinopril. Advised to decrease salt intake and counseled on healthy diet. Will continue to monitor closely.

## 2022-07-09 ENCOUNTER — Encounter: Payer: Self-pay | Admitting: Nurse Practitioner

## 2022-07-09 ENCOUNTER — Telehealth: Payer: Self-pay

## 2022-07-09 DIAGNOSIS — E785 Hyperlipidemia, unspecified: Secondary | ICD-10-CM | POA: Insufficient documentation

## 2022-07-09 LAB — TSH: TSH: 0.1 u[IU]/mL — ABNORMAL LOW (ref 0.35–5.50)

## 2022-07-09 LAB — T3, FREE: T3, Free: 3.2 pg/mL (ref 2.3–4.2)

## 2022-07-09 LAB — T4, FREE: Free T4: 1.34 ng/dL (ref 0.60–1.60)

## 2022-07-09 NOTE — Assessment & Plan Note (Addendum)
Chronic. Stable on Lexapro 10 mg daily. Continue. Symptoms well managed at this time. Encouraged to continue seeing therapist monthly. Advised to contact if worsening symptoms, unusual behavior changes or suicidal thoughts occur. Will continue to monitor.

## 2022-07-09 NOTE — Telephone Encounter (Signed)
Left a detailed VM informing pt to Cb to get scheduled in 1-2 weeks for a BP check (OV not NV)  Pt calls back please schedule an OV in 1-2 weeks and inform to bring BP cuff:  Bethanie Dicker, NP  Donavan Foil, CMA Will you see if he can come back in one or two weeks for a BP check and have him bring his machine in to calibrate please.

## 2022-07-09 NOTE — Assessment & Plan Note (Addendum)
Newly diagnosed and started on Lipitor 40 mg daily. Continue. Encouraged healthy diet and exercise. Will check fasting lipids in the future.

## 2022-07-13 ENCOUNTER — Telehealth: Payer: Self-pay

## 2022-07-13 NOTE — Telephone Encounter (Signed)
Called and LMOM informing pt that Ms.Kacy sent him a mychart msg on his labs and to get him scheduled for a lab appt in 3 months

## 2022-09-29 ENCOUNTER — Encounter (INDEPENDENT_AMBULATORY_CARE_PROVIDER_SITE_OTHER): Payer: Self-pay

## 2022-10-20 ENCOUNTER — Ambulatory Visit: Payer: BC Managed Care – PPO | Admitting: Nurse Practitioner

## 2022-11-11 ENCOUNTER — Other Ambulatory Visit: Payer: Self-pay | Admitting: Nurse Practitioner

## 2022-11-11 DIAGNOSIS — M792 Neuralgia and neuritis, unspecified: Secondary | ICD-10-CM

## 2022-11-11 DIAGNOSIS — I872 Venous insufficiency (chronic) (peripheral): Secondary | ICD-10-CM

## 2022-11-19 ENCOUNTER — Ambulatory Visit
Admission: RE | Admit: 2022-11-19 | Discharge: 2022-11-19 | Disposition: A | Discharge status: Home / Self Care | Payer: BC Managed Care – PPO | Source: Ambulatory Visit | Attending: Nurse Practitioner | Admitting: Nurse Practitioner

## 2022-11-19 DIAGNOSIS — M792 Neuralgia and neuritis, unspecified: Secondary | ICD-10-CM | POA: Insufficient documentation

## 2022-11-19 DIAGNOSIS — I872 Venous insufficiency (chronic) (peripheral): Secondary | ICD-10-CM | POA: Diagnosis present

## 2023-06-08 ENCOUNTER — Telehealth: Payer: Self-pay

## 2023-06-08 NOTE — Telephone Encounter (Signed)
 Called pt to get him scheduled a follow up appt with Ms. Arville Lime as she seen him in May of last year and wanted him to follow up in 3 months but cancelled the appt.    Pt then informed me he thought that we were an endocrinologist due to him googling for an endocrinologist and our office came up and he stated he already has a PCP and no longer needs to see [place].    I have removed Bethanie Dicker, NP as PCP

## 2023-11-10 DIAGNOSIS — N529 Male erectile dysfunction, unspecified: Secondary | ICD-10-CM | POA: Insufficient documentation

## 2023-11-10 DIAGNOSIS — R399 Unspecified symptoms and signs involving the genitourinary system: Secondary | ICD-10-CM | POA: Insufficient documentation

## 2023-11-10 NOTE — Assessment & Plan Note (Deleted)
  IPSS -  Today we reviewed the physiology and common causes of male lower urinary tract symptoms (LUTS). Discussed potential etiologies including infectious, inflammatory, bladder-related, benign prostatic hyperplasia (BPH), and musculoskeletal/pelvic floor contributions. Reviewed the standard diagnostic workup (urinalysis, PVR, uroflow, prostate assessment, possible cystoscopy or imaging) and the spectrum of initial management strategies ranging from behavioral and lifestyle measures to pharmacologic therapy, with procedural options if indicated. All questions were addressed and the patient expressed understanding of the evaluation and treatment pathway.

## 2023-11-10 NOTE — Assessment & Plan Note (Deleted)
 We reviewed the basic tenets of erectile dysfunction management, including normal erectile physiology, common contributing factors, and the spectrum of therapeutic options. Conservative measures such as lifestyle modification, optimization of comorbid conditions, and avoidance of exacerbating medications were discussed. Pharmacologic options including PDE5 inhibitors, intracavernosal or intraurethral therapies, and vacuum erection devices were reviewed, as well as surgical approaches such as penile prosthesis implantation. All questions were answered.
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
## 2023-11-18 ENCOUNTER — Ambulatory Visit: Admitting: Urology

## 2023-11-18 DIAGNOSIS — R399 Unspecified symptoms and signs involving the genitourinary system: Secondary | ICD-10-CM

## 2023-11-18 DIAGNOSIS — N529 Male erectile dysfunction, unspecified: Secondary | ICD-10-CM

## 2023-11-21 ENCOUNTER — Telehealth: Payer: Self-pay

## 2023-11-21 DIAGNOSIS — Z87891 Personal history of nicotine dependence: Secondary | ICD-10-CM

## 2023-11-21 DIAGNOSIS — Z122 Encounter for screening for malignant neoplasm of respiratory organs: Secondary | ICD-10-CM

## 2023-11-21 NOTE — Telephone Encounter (Signed)
 Lung Cancer Screening Narrative/Criteria Questionnaire (Cigarette Smokers Only- No Cigars/Pipes/vapes)   Brandon Mullen   SDMV:11/25/2023 at 9:30 with Laneta         Mar 31, 1975               LDCT: 11/25/2023 at 12:00 pm [facility]    48 y.o.  Phone: [phone]  Lung Screening Narrative (confirm age 50-77 yrs Medicare / 50-80 yrs Private pay insurance)   Insurance information: BCBS   Referring Provider: Noretta Cave, MD   This screening involves an initial phone call with a team member from our program. It is called a shared decision making visit. The initial meeting is required by  insurance and Medicare to make sure you understand the program. This appointment takes about 15-20 minutes to complete. You will complete the screening scan at your scheduled date/time.  This scan takes about 5-10 minutes to complete. You can eat and drink normally before and after the scan.  Criteria questions for Lung Cancer Screening:   Are you a current or former smoker? Former Age began smoking: 13   If you are a former smoker, what year did you quit smoking?Quit 2020 (within 15 yrs)   To calculate your smoking history, I need an accurate estimate of how many packs of cigarettes you smoked per day and for how many years. (Not just the number of PPD you are now smoking)   Years smoking 35 x Packs per day 2 = Pack years 68   (at least 20 pack yrs)   (Make sure they understand that we need to know how much they have smoked in the past, not just the number of PPD they are smoking now)  Do you have a personal history of cancer?  No    Do you have a family history of cancer? Yes  (cancer type and and relative) Grand mother and grandfather with lung cancer.   Are you coughing up blood?  No  Have you had unexplained weight loss of 15 lbs or more in the last 6 months? No  It looks like you meet all criteria.  When would be a good time for us  to schedule you for this screening?   Additional information:  N/A

## 2023-11-25 ENCOUNTER — Encounter: Payer: Self-pay | Admitting: *Deleted

## 2023-11-25 ENCOUNTER — Ambulatory Visit
Admission: RE | Admit: 2023-11-25 | Discharge: 2023-11-25 | Disposition: A | Discharge status: Home / Self Care | Source: Ambulatory Visit | Attending: Acute Care | Admitting: Acute Care

## 2023-11-25 ENCOUNTER — Ambulatory Visit (INDEPENDENT_AMBULATORY_CARE_PROVIDER_SITE_OTHER): Admitting: *Deleted

## 2023-11-25 DIAGNOSIS — Z87891 Personal history of nicotine dependence: Secondary | ICD-10-CM | POA: Diagnosis not present

## 2023-11-25 DIAGNOSIS — Z122 Encounter for screening for malignant neoplasm of respiratory organs: Secondary | ICD-10-CM | POA: Diagnosis present

## 2023-11-25 NOTE — Patient Instructions (Signed)

## 2023-11-25 NOTE — Progress Notes (Signed)
  Virtual Visit via Telephone Note  I connected with ZAYAN DELVECCHIO on 11/25/23 at  9:30 AM EDT by telephone and verified that I am speaking with the correct person using two identifiers.  Location: Patient: at home Provider: [address] [place], [place], [address]    I discussed the limitations, risks, security and privacy concerns of performing an evaluation and management service by telephone and the availability of in person appointments. I also discussed with the patient that there may be a patient responsible charge related to this service. The patient expressed understanding and agreed to proceed.   Shared Decision Making Visit Lung Cancer Screening Program [phone])   Eligibility: Age 48 y.o. Pack Years Smoking History Calculation 58 (# packs/per year x # years smoked) Recent History of coughing up blood  no Unexplained weight loss? no ( >Than 15 pounds within the last 6 months ) Prior History Lung / other cancer no (Diagnosis within the last 5 years already requiring surveillance chest CT Scans). Smoking Status Former Smoker Former Smokers: Years since quit: 5 years  Quit Date: 2020  Visit Components: Discussion included one or more decision making aids. yes Discussion included risk/benefits of screening. yes Discussion included potential follow up diagnostic testing for abnormal scans. yes Discussion included meaning and risk of over diagnosis. yes Discussion included meaning and risk of False Positives. yes Discussion included meaning of total radiation exposure. yes  Counseling Included: Importance of adherence to annual lung cancer LDCT screening. yes Impact of comorbidities on ability to participate in the program. yes Ability and willingness to under diagnostic treatment. yes  Smoking Cessation Counseling: Current Smokers:  Discussed importance of smoking cessation. yes Information about tobacco cessation classes and interventions provided to  patient. yes Patient provided with ticket for LDCT Scan. no Symptomatic Patient. no  Diagnosis Code: Tobacco Use Z72.0 Asymptomatic Patient yes   Counseled patient 4 minutes regarding tobacco use.   Former Smokers:  Discussed the importance of maintaining cigarette abstinence. yes Diagnosis Code: Personal History of Nicotine  Dependence. S12.108 Information about tobacco cessation classes and interventions provided to patient. Yes Patient provided with ticket for LDCT Scan. no Written Order for Lung Cancer Screening with LDCT placed in Epic. Yes (CT Chest Lung Cancer Screening Low Dose W/O CM) [id number] Z12.2-Screening of respiratory organs Z87.891-Personal history of nicotine  dependence   Laneta Speaks, RN

## 2023-12-02 ENCOUNTER — Other Ambulatory Visit: Payer: Self-pay | Admitting: Acute Care

## 2023-12-02 DIAGNOSIS — Z87891 Personal history of nicotine dependence: Secondary | ICD-10-CM

## 2023-12-02 DIAGNOSIS — Z122 Encounter for screening for malignant neoplasm of respiratory organs: Secondary | ICD-10-CM

## 2023-12-22 NOTE — Assessment & Plan Note (Deleted)
 2-yr hx progressive LUTS  Today we reviewed the physiology and common causes of male lower urinary tract symptoms (LUTS). Discussed potential etiologies including infectious, inflammatory, bladder-related, benign prostatic hyperplasia (BPH), and musculoskeletal/pelvic floor contributions. Reviewed the standard diagnostic workup (urinalysis, PVR, uroflow, prostate assessment, possible cystoscopy or imaging) and the spectrum of initial management strategies ranging from behavioral and lifestyle measures to pharmacologic therapy, with procedural options if indicated. All questions were addressed and the patient expressed understanding of the evaluation and treatment pathway.
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
## 2023-12-29 ENCOUNTER — Ambulatory Visit: Admitting: Urology

## 2023-12-29 DIAGNOSIS — R399 Unspecified symptoms and signs involving the genitourinary system: Secondary | ICD-10-CM

## 2024-01-31 ENCOUNTER — Encounter: Payer: Self-pay | Admitting: Sleep Medicine
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
## 2024-02-01 ENCOUNTER — Ambulatory Visit: Attending: Physician Assistant | Admitting: Physician Assistant

## 2024-02-01 ENCOUNTER — Encounter: Payer: Self-pay | Admitting: Physician Assistant

## 2024-02-01 VITALS — BP 108/78 | HR 60 | Ht 68.0 in | Wt 215.0 lb

## 2024-02-01 DIAGNOSIS — G4730 Sleep apnea, unspecified: Secondary | ICD-10-CM

## 2024-02-01 DIAGNOSIS — E785 Hyperlipidemia, unspecified: Secondary | ICD-10-CM | POA: Diagnosis not present

## 2024-02-01 DIAGNOSIS — I10 Essential (primary) hypertension: Secondary | ICD-10-CM | POA: Diagnosis not present

## 2024-02-01 DIAGNOSIS — I700 Atherosclerosis of aorta: Secondary | ICD-10-CM

## 2024-02-01 NOTE — Patient Instructions (Signed)
 Medication Instructions:  Your physician recommends that you continue on your current medications as directed. Please refer to the Current Medication list given to you today.   *If you need a refill on your cardiac medications before your next appointment, please call your pharmacy*  Lab Work: None ordered at this time  If you have labs (blood work) drawn today and your tests are completely normal, you will receive your results only by: MyChart Message (if you have MyChart) OR A paper copy in the mail If you have any lab test that is abnormal or we need to change your treatment, we will call you to review the results.  Testing/Procedures: CT coronary calcium  score.   - $99 out of pocket cost at the time of your test - Call [phone] to schedule at your convenience.  [facility] [address] [place], [place] [address]   Coronary CalciumScan A coronary calcium  scan is an imaging test used to look for deposits of calcium  and other fatty materials (plaques) in the inner lining of the blood vessels of the heart (coronary arteries). These deposits of calcium  and plaques can partly clog and narrow the coronary arteries without producing any symptoms or warning signs. This puts a person at risk for a heart attack. This test can detect these deposits before symptoms develop. Tell a health care provider about: Any allergies you have. All medicines you are taking, including vitamins, herbs, eye drops, creams, and over-the-counter medicines. Any problems you or family members have had with anesthetic medicines. Any blood disorders you have. Any surgeries you have had. Any medical conditions you have. Whether you are pregnant or may be pregnant. What are the risks? Generally, this is a safe procedure. However, problems may occur, including: Harm to a pregnant woman and her unborn baby. This test involves the use of radiation. Radiation exposure can  be dangerous to a pregnant woman and her unborn baby. If you are pregnant, you generally should not have this procedure done. Slight increase in the risk of cancer. This is because of the radiation involved in the test. What happens before the procedure? No preparation is needed for this procedure. What happens during the procedure? You will undress and remove any jewelry around your neck or chest. You will put on a hospital gown. Sticky electrodes will be placed on your chest. The electrodes will be connected to an electrocardiogram (ECG) machine to record a tracing of the electrical activity of your heart. A CT scanner will take pictures of your heart. During this time, you will be asked to lie still and hold your breath for 2-3 seconds while a picture of your heart is being taken. The procedure may vary among health care providers and hospitals. What happens after the procedure? You can get dressed. You can return to your normal activities. It is up to you to get the results of your test. Ask your health care provider, or the department that is doing the test, when your results will be ready. Summary A coronary calcium  scan is an imaging test used to look for deposits of calcium  and other fatty materials (plaques) in the inner lining of the blood vessels of the heart (coronary arteries). Generally, this is a safe procedure. Tell your health care provider if you are pregnant or may be pregnant. No preparation is needed for this procedure. A CT scanner will take pictures of your heart. You can return to your normal activities after the scan  is done. This information is not intended to replace advice given to you by your health care provider. Make sure you discuss any questions you have with your health care provider. Document Released: 08/14/2007 Document Revised: 01/05/2016 Document Reviewed: 01/05/2016 Elsevier Interactive Patient Education  2017 [employer].   Follow-Up: At [facility], you and your health needs are our priority.  As part of our continuing mission to provide you with exceptional heart care, our providers are all part of one team.  This team includes your primary Cardiologist (physician) and Advanced Practice Providers or APPs (Physician Assistants and Nurse Practitioners) who all work together to provide you with the care you need, when you need it.  Your next appointment:   2 month(s)  Provider:   You may see or one of the following Advanced Practice Providers on your designated Care Team:  Bernardino Bring, PA-C

## 2024-02-09 ENCOUNTER — Other Ambulatory Visit: Payer: Self-pay | Admitting: Medical Genetics

## 2024-02-10 ENCOUNTER — Other Ambulatory Visit

## 2024-02-13 ENCOUNTER — Inpatient Hospital Stay
Admission: RE | Admit: 2024-02-13 | Discharge: 2024-02-13 | Payer: Self-pay | Attending: Physician Assistant | Admitting: Physician Assistant

## 2024-02-13 DIAGNOSIS — I700 Atherosclerosis of aorta: Secondary | ICD-10-CM

## 2024-02-14 ENCOUNTER — Ambulatory Visit: Payer: Self-pay | Admitting: Physician Assistant

## 2024-02-14 DIAGNOSIS — E785 Hyperlipidemia, unspecified: Secondary | ICD-10-CM

## 2024-02-14 DIAGNOSIS — Z79899 Other long term (current) drug therapy: Secondary | ICD-10-CM

## 2024-02-14 MED ORDER — ATORVASTATIN CALCIUM 20 MG PO TABS: 20.0000 mg | ORAL_TABLET | Freq: Every day | ORAL | 3 refills | Status: AC

## 2024-04-05 ENCOUNTER — Ambulatory Visit: Admitting: Physician Assistant

## 2024-04-05 DIAGNOSIS — I700 Atherosclerosis of aorta: Secondary | ICD-10-CM

## 2024-04-05 DIAGNOSIS — I10 Essential (primary) hypertension: Secondary | ICD-10-CM

## 2024-04-05 DIAGNOSIS — E785 Hyperlipidemia, unspecified: Secondary | ICD-10-CM

## 2024-04-05 DIAGNOSIS — G4730 Sleep apnea, unspecified: Secondary | ICD-10-CM

## 2024-04-06 ENCOUNTER — Encounter: Payer: Self-pay | Admitting: Physician Assistant
